# Patient Record
Sex: Female | Born: 1955 | Race: White | Hispanic: No | Marital: Married | Smoking: Former smoker
Health system: Southern US, Community
[De-identification: ages and names within clinical notes are randomized; demographics above are authoritative.]

## PROBLEM LIST (undated history)

## (undated) DIAGNOSIS — G5600 Carpal tunnel syndrome, unspecified upper limb: Secondary | ICD-10-CM

## (undated) DIAGNOSIS — F329 Major depressive disorder, single episode, unspecified: Secondary | ICD-10-CM

## (undated) DIAGNOSIS — I10 Essential (primary) hypertension: Secondary | ICD-10-CM

## (undated) DIAGNOSIS — E559 Vitamin D deficiency, unspecified: Secondary | ICD-10-CM

## (undated) DIAGNOSIS — F32A Depression, unspecified: Secondary | ICD-10-CM

## (undated) DIAGNOSIS — Z972 Presence of dental prosthetic device (complete) (partial): Secondary | ICD-10-CM

## (undated) DIAGNOSIS — F909 Attention-deficit hyperactivity disorder, unspecified type: Secondary | ICD-10-CM

## (undated) DIAGNOSIS — T8859XA Other complications of anesthesia, initial encounter: Secondary | ICD-10-CM

## (undated) DIAGNOSIS — G459 Transient cerebral ischemic attack, unspecified: Secondary | ICD-10-CM

## (undated) DIAGNOSIS — F319 Bipolar disorder, unspecified: Secondary | ICD-10-CM

## (undated) DIAGNOSIS — H3530 Unspecified macular degeneration: Secondary | ICD-10-CM

## (undated) DIAGNOSIS — F419 Anxiety disorder, unspecified: Secondary | ICD-10-CM

## (undated) DIAGNOSIS — M797 Fibromyalgia: Secondary | ICD-10-CM

## (undated) DIAGNOSIS — T4145XA Adverse effect of unspecified anesthetic, initial encounter: Secondary | ICD-10-CM

## (undated) HISTORY — PX: BREAST BIOPSY: SHX20

## (undated) HISTORY — DX: Vitamin D deficiency, unspecified: E55.9

## (undated) HISTORY — PX: THYROIDECTOMY: SHX17

## (undated) HISTORY — PX: ABDOMINAL HYSTERECTOMY: SHX81

## (undated) HISTORY — DX: Carpal tunnel syndrome, unspecified upper limb: G56.00

## (undated) HISTORY — PX: ABLATION: SHX5711

## (undated) HISTORY — PX: ANTERIOR CRUCIATE LIGAMENT REPAIR: SHX115

## (undated) HISTORY — DX: Major depressive disorder, single episode, unspecified: F32.9

## (undated) HISTORY — PX: EYE SURGERY: SHX253

## (undated) HISTORY — DX: Bipolar disorder, unspecified: F31.9

## (undated) HISTORY — DX: Unspecified macular degeneration: H35.30

## (undated) HISTORY — PX: TONSILLECTOMY AND ADENOIDECTOMY: SHX28

## (undated) HISTORY — DX: Attention-deficit hyperactivity disorder, unspecified type: F90.9

## (undated) HISTORY — DX: Depression, unspecified: F32.A

## (undated) HISTORY — DX: Anxiety disorder, unspecified: F41.9

---

## 2017-08-13 DIAGNOSIS — F325 Major depressive disorder, single episode, in full remission: Secondary | ICD-10-CM | POA: Diagnosis not present

## 2017-08-13 DIAGNOSIS — F419 Anxiety disorder, unspecified: Secondary | ICD-10-CM | POA: Diagnosis not present

## 2017-08-13 DIAGNOSIS — I10 Essential (primary) hypertension: Secondary | ICD-10-CM | POA: Insufficient documentation

## 2017-08-13 DIAGNOSIS — M62838 Other muscle spasm: Secondary | ICD-10-CM | POA: Diagnosis not present

## 2017-08-13 DIAGNOSIS — R030 Elevated blood-pressure reading, without diagnosis of hypertension: Secondary | ICD-10-CM | POA: Diagnosis not present

## 2017-08-13 DIAGNOSIS — F5104 Psychophysiologic insomnia: Secondary | ICD-10-CM | POA: Diagnosis not present

## 2017-08-13 DIAGNOSIS — Z1231 Encounter for screening mammogram for malignant neoplasm of breast: Secondary | ICD-10-CM | POA: Diagnosis not present

## 2017-08-13 DIAGNOSIS — E559 Vitamin D deficiency, unspecified: Secondary | ICD-10-CM | POA: Diagnosis not present

## 2017-08-13 DIAGNOSIS — M2550 Pain in unspecified joint: Secondary | ICD-10-CM | POA: Diagnosis not present

## 2017-08-13 DIAGNOSIS — F909 Attention-deficit hyperactivity disorder, unspecified type: Secondary | ICD-10-CM | POA: Diagnosis not present

## 2017-08-15 DIAGNOSIS — M2550 Pain in unspecified joint: Secondary | ICD-10-CM | POA: Insufficient documentation

## 2017-08-15 DIAGNOSIS — M62838 Other muscle spasm: Secondary | ICD-10-CM | POA: Insufficient documentation

## 2017-08-15 DIAGNOSIS — E559 Vitamin D deficiency, unspecified: Secondary | ICD-10-CM | POA: Insufficient documentation

## 2017-08-15 DIAGNOSIS — F909 Attention-deficit hyperactivity disorder, unspecified type: Secondary | ICD-10-CM | POA: Insufficient documentation

## 2017-08-15 DIAGNOSIS — F325 Major depressive disorder, single episode, in full remission: Secondary | ICD-10-CM | POA: Insufficient documentation

## 2017-08-15 DIAGNOSIS — F419 Anxiety disorder, unspecified: Secondary | ICD-10-CM | POA: Insufficient documentation

## 2017-08-16 ENCOUNTER — Other Ambulatory Visit: Payer: Self-pay | Admitting: Internal Medicine

## 2017-08-16 ENCOUNTER — Ambulatory Visit: Payer: Self-pay | Admitting: Family Medicine

## 2017-08-16 DIAGNOSIS — Z1239 Encounter for other screening for malignant neoplasm of breast: Secondary | ICD-10-CM

## 2017-08-31 DIAGNOSIS — M2042 Other hammer toe(s) (acquired), left foot: Secondary | ICD-10-CM | POA: Diagnosis not present

## 2017-08-31 DIAGNOSIS — M2012 Hallux valgus (acquired), left foot: Secondary | ICD-10-CM | POA: Diagnosis not present

## 2017-08-31 DIAGNOSIS — M21622 Bunionette of left foot: Secondary | ICD-10-CM | POA: Diagnosis not present

## 2017-09-01 ENCOUNTER — Ambulatory Visit: Payer: Self-pay | Admitting: Psychiatry

## 2017-09-16 DIAGNOSIS — F5104 Psychophysiologic insomnia: Secondary | ICD-10-CM | POA: Diagnosis not present

## 2017-09-16 DIAGNOSIS — Z13820 Encounter for screening for osteoporosis: Secondary | ICD-10-CM | POA: Diagnosis not present

## 2017-09-16 DIAGNOSIS — M7918 Myalgia, other site: Secondary | ICD-10-CM | POA: Insufficient documentation

## 2017-09-16 DIAGNOSIS — F325 Major depressive disorder, single episode, in full remission: Secondary | ICD-10-CM | POA: Diagnosis not present

## 2017-09-16 DIAGNOSIS — M2550 Pain in unspecified joint: Secondary | ICD-10-CM | POA: Diagnosis not present

## 2017-09-16 DIAGNOSIS — E559 Vitamin D deficiency, unspecified: Secondary | ICD-10-CM | POA: Diagnosis not present

## 2017-10-05 ENCOUNTER — Ambulatory Visit: Payer: Self-pay | Admitting: Psychiatry

## 2017-10-14 DIAGNOSIS — L821 Other seborrheic keratosis: Secondary | ICD-10-CM | POA: Diagnosis not present

## 2017-10-14 DIAGNOSIS — D2271 Melanocytic nevi of right lower limb, including hip: Secondary | ICD-10-CM | POA: Diagnosis not present

## 2017-10-14 DIAGNOSIS — D2262 Melanocytic nevi of left upper limb, including shoulder: Secondary | ICD-10-CM | POA: Diagnosis not present

## 2017-10-14 DIAGNOSIS — D2272 Melanocytic nevi of left lower limb, including hip: Secondary | ICD-10-CM | POA: Diagnosis not present

## 2017-10-14 DIAGNOSIS — D2261 Melanocytic nevi of right upper limb, including shoulder: Secondary | ICD-10-CM | POA: Diagnosis not present

## 2017-10-18 ENCOUNTER — Other Ambulatory Visit: Payer: Self-pay

## 2017-10-18 ENCOUNTER — Encounter: Payer: Self-pay | Admitting: Psychiatry

## 2017-10-18 ENCOUNTER — Ambulatory Visit: Payer: Self-pay | Admitting: Psychiatry

## 2017-10-18 VITALS — BP 152/87 | HR 85 | Temp 98.1°F | Ht 66.54 in | Wt 215.8 lb

## 2017-10-18 DIAGNOSIS — F3160 Bipolar disorder, current episode mixed, unspecified: Secondary | ICD-10-CM

## 2017-10-18 DIAGNOSIS — F410 Panic disorder [episodic paroxysmal anxiety] without agoraphobia: Secondary | ICD-10-CM | POA: Diagnosis not present

## 2017-10-18 DIAGNOSIS — Z8659 Personal history of other mental and behavioral disorders: Secondary | ICD-10-CM

## 2017-10-18 MED ORDER — HYDROXYZINE PAMOATE 25 MG PO CAPS: 25.0000 mg | ORAL_CAPSULE | Freq: Three times a day (TID) | ORAL | 1 refills | Status: DC | PRN

## 2017-10-18 MED ORDER — BUPROPION HCL ER (XL) 300 MG PO TB24: 300.0000 mg | ORAL_TABLET | Freq: Every day | ORAL | 2 refills | Status: DC

## 2017-10-18 MED ORDER — QUETIAPINE FUMARATE 25 MG PO TABS
25.0000 mg | ORAL_TABLET | Freq: Every day | ORAL | 1 refills | Status: DC
Start: 2017-10-18 — End: 2017-10-27

## 2017-10-18 MED ORDER — AMPHETAMINE-DEXTROAMPHETAMINE 30 MG PO TABS: 30.0000 mg | ORAL_TABLET | Freq: Every day | ORAL | 0 refills | Status: DC
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
## 2017-10-18 NOTE — Patient Instructions (Signed)
Hydroxyzine capsules or tablets What is this medicine? HYDROXYZINE (hye [place] i zeen) is an antihistamine. This medicine is used to treat allergy symptoms. It is also used to treat anxiety and tension. This medicine can be used with other medicines to induce sleep before surgery. This medicine may be used for other purposes; ask your health care provider or pharmacist if you have questions. COMMON BRAND NAME(S): ANX, Atarax, Rezine, Vistaril What should I tell my health care provider before I take this medicine? They need to know if you have any of these conditions: -any chronic illness -difficulty passing urine -glaucoma -heart disease -kidney disease -liver disease -lung disease -an unusual or allergic reaction to hydroxyzine, cetirizine, other medicines, foods, dyes, or preservatives -pregnant or trying to get pregnant -breast-feeding How should I use this medicine? Take this medicine by mouth with a full glass of water. Follow the directions on the prescription label. You may take this medicine with food or on an empty stomach. Take your medicine at regular intervals. Do not take your medicine more often than directed. Talk to your pediatrician regarding the use of this medicine in children. Special care may be needed. While this drug may be prescribed for children as young as 75 years of age for selected conditions, precautions do apply. Patients over 62 years old may have a stronger reaction and need a smaller dose. Overdosage: If you think you have taken too much of this medicine contact a poison control center or emergency room at once. NOTE: This medicine is only for you. Do not share this medicine with others. What if I miss a dose? If you miss a dose, take it as soon as you can. If it is almost time for your next dose, take only that dose. Do not take double or extra doses. What may interact with this medicine? -alcohol -barbiturate medicines for sleep or seizures -medicines for  colds, allergies -medicines for depression, anxiety, or emotional disturbances -medicines for pain -medicines for sleep -muscle relaxants This list may not describe all possible interactions. Give your health care provider a list of all the medicines, herbs, non-prescription drugs, or dietary supplements you use. Also tell them if you smoke, drink alcohol, or use illegal drugs. Some items may interact with your medicine. What should I watch for while using this medicine? Tell your doctor or health care professional if your symptoms do not improve. You may get drowsy or dizzy. Do not drive, use machinery, or do anything that needs mental alertness until you know how this medicine affects you. Do not stand or sit up quickly, especially if you are an older patient. This reduces the risk of dizzy or fainting spells. Alcohol may interfere with the effect of this medicine. Avoid alcoholic drinks. Your mouth may get dry. Chewing sugarless gum or sucking hard candy, and drinking plenty of water may help. Contact your doctor if the problem does not go away or is severe. This medicine may cause dry eyes and blurred vision. If you wear contact lenses you may feel some discomfort. Lubricating drops may help. See your eye doctor if the problem does not go away or is severe. If you are receiving skin tests for allergies, tell your doctor you are using this medicine. What side effects may I notice from receiving this medicine? Side effects that you should report to your doctor or health care professional as soon as possible: -fast or irregular heartbeat -difficulty passing urine -seizures -slurred speech or confusion -tremor Side effects that  usually do not require medical attention (report to your doctor or health care professional if they continue or are bothersome): -constipation -drowsiness -fatigue -headache -stomach upset This list may not describe all possible side effects. Call your doctor for  medical advice about side effects. You may report side effects to FDA at 1-800-FDA-1088. Where should I keep my medicine? Keep out of the reach of children. Store at room temperature between 15 and 30 degrees C (59 and 86 degrees F). Keep container tightly closed. Throw away any unused medicine after the expiration date. NOTE: This sheet is a summary. It may not cover all possible information. If you have questions about this medicine, talk to your doctor, pharmacist, or health care provider.  2018 Elsevier/Gold Standard (2007-11-11 14:50:59) Quetiapine tablets What is this medicine? QUETIAPINE (kwe TYE a peen) is an antipsychotic. It is used to treat schizophrenia and bipolar disorder, also known as manic-depression. This medicine may be used for other purposes; ask your health care provider or pharmacist if you have questions. COMMON BRAND NAME(S): Seroquel What should I tell my health care provider before I take this medicine? They need to know if you have any of these conditions: -brain tumor or head injury -breast cancer -cataracts -diabetes -difficulty swallowing -heart disease -kidney disease -liver disease -low blood counts, like low white cell, platelet, or red cell counts -low blood pressure or dizziness when standing up -Parkinson's disease -previous heart attack -seizures -suicidal thoughts, plans, or attempt by you or a family member -thyroid disease -an unusual or allergic reaction to quetiapine, other medicines, foods, dyes, or preservatives -pregnant or trying to get pregnant -breast-feeding How should I use this medicine? Take this medicine by mouth. Swallow it with a drink of water. Follow the directions on the prescription label. If it upsets your stomach you can take it with food. Take your medicine at regular intervals. Do not take it more often than directed. Do not stop taking except on the advice of your doctor or health care professional. A special MedGuide  will be given to you by the pharmacist with each prescription and refill. Be sure to read this information carefully each time. Talk to your pediatrician regarding the use of this medicine in children. While this drug may be prescribed for children as young as 10 years for selected conditions, precautions do apply. Patients over age 65 years may have a stronger reaction to this medicine and need smaller doses. Overdosage: If you think you have taken too much of this medicine contact a poison control center or emergency room at once. NOTE: This medicine is only for you. Do not share this medicine with others. What if I miss a dose? If you miss a dose, take it as soon as you can. If it is almost time for your next dose, take only that dose. Do not take double or extra doses. What may interact with this medicine? Do not take this medicine with any of the following medications: -certain medicines for fungal infections like fluconazole, itraconazole, ketoconazole, posaconazole, voriconazole -cisapride -dofetilide -dronedarone -droperidol -grepafloxacin -halofantrine -phenothiazines like chlorpromazine, mesoridazine, thioridazine -pimozide -sparfloxacin -ziprasidone This medicine may also interact with the following medications: -alcohol -antiviral medicines for HIV or AIDS -certain medicines for blood pressure -certain medicines for depression, anxiety, or psychotic disturbances like haloperidol, lorazepam -certain medicines for diabetes -certain medicines for Parkinson's disease -certain medicines for seizures like carbamazepine, phenobarbital, phenytoin -cimetidine -erythromycin -other medicines that prolong the QT interval (cause an abnormal heart rhythm) -rifampin -steroid   medicines like prednisone or cortisone This list may not describe all possible interactions. Give your health care provider a list of all the medicines, herbs, non-prescription drugs, or dietary supplements you use.  Also tell them if you smoke, drink alcohol, or use illegal drugs. Some items may interact with your medicine. What should I watch for while using this medicine? Visit your doctor or health care professional for regular checks on your progress. It may be several weeks before you see the full effects of this medicine. Your health care provider may suggest that you have your eyes examined prior to starting this medicine, and every 6 months thereafter. If you have been taking this medicine regularly for some time, do not suddenly stop taking it. You must gradually reduce the dose or your symptoms may get worse. Ask your doctor or health care professional for advice. Patients and their families should watch out for worsening depression or thoughts of suicide. Also watch out for sudden or severe changes in feelings such as feeling anxious, agitated, panicky, irritable, hostile, aggressive, impulsive, severely restless, overly excited and hyperactive, or not being able to sleep. If this happens, especially at the beginning of antidepressant treatment or after a change in dose, call your health care professional. You may get dizzy or drowsy. Do not drive, use machinery, or do anything that needs mental alertness until you know how this medicine affects you. Do not stand or sit up quickly, especially if you are an older patient. This reduces the risk of dizzy or fainting spells. Alcohol can increase dizziness and drowsiness. Avoid alcoholic drinks. Do not treat yourself for colds, diarrhea or allergies. Ask your doctor or health care professional for advice, some ingredients may increase possible side effects. This medicine can reduce the response of your body to heat or cold. Dress warm in cold weather and stay hydrated in hot weather. If possible, avoid extreme temperatures like saunas, hot tubs, very hot or cold showers, or activities that can cause dehydration such as vigorous exercise. What side effects may I  notice from receiving this medicine? Side effects that you should report to your doctor or health care professional as soon as possible: -allergic reactions like skin rash, itching or hives, swelling of the face, lips, or tongue -difficulty swallowing -fast or irregular heartbeat -fever or chills, sore throat -fever with rash, swollen lymph nodes, or swelling of the face -increased hunger or thirst -increased urination -problems with balance, talking, walking -seizures -stiff muscles -suicidal thoughts or other mood changes -uncontrollable head, mouth, neck, arm, or leg movements -unusually weak or tired Side effects that usually do not require medical attention (report to your doctor or health care professional if they continue or are bothersome): -change in sex drive or performance -constipation -drowsy or dizzy -dry mouth -stomach upset -weight gain This list may not describe all possible side effects. Call your doctor for medical advice about side effects. You may report side effects to FDA at 1-800-FDA-1088. Where should I keep my medicine? Keep out of the reach of children. Store at room temperature between 15 and 30 degrees C (59 and 86 degrees F). Throw away any unused medicine after the expiration date. NOTE: This sheet is a summary. It may not cover all possible information. If you have questions about this medicine, talk to your doctor, pharmacist, or health care provider.  2018 Elsevier/Gold Standard (2015-01-01 13:07:35)  

## 2017-10-19 ENCOUNTER — Encounter: Payer: Self-pay | Admitting: Psychiatry

## 2017-10-27 ENCOUNTER — Encounter: Payer: Self-pay | Admitting: Psychiatry

## 2017-10-27 ENCOUNTER — Other Ambulatory Visit: Payer: Self-pay

## 2017-10-27 ENCOUNTER — Ambulatory Visit: Payer: Medicare HMO | Admitting: Psychiatry

## 2017-10-27 VITALS — BP 135/82 | HR 94 | Wt 213.6 lb

## 2017-10-27 DIAGNOSIS — F3160 Bipolar disorder, current episode mixed, unspecified: Secondary | ICD-10-CM

## 2017-10-27 DIAGNOSIS — F410 Panic disorder [episodic paroxysmal anxiety] without agoraphobia: Secondary | ICD-10-CM

## 2017-10-27 DIAGNOSIS — Z8659 Personal history of other mental and behavioral disorders: Secondary | ICD-10-CM | POA: Diagnosis not present

## 2017-10-27 MED ORDER — OLANZAPINE 5 MG PO TABS: 5.0000 mg | ORAL_TABLET | Freq: Every day | ORAL | 1 refills | Status: DC

## 2017-10-27 MED ORDER — TRAZODONE HCL 100 MG PO TABS: 100.0000 mg | ORAL_TABLET | Freq: Every day | ORAL | 1 refills | Status: DC
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
## 2017-10-27 NOTE — Patient Instructions (Signed)
Olanzapine tablets What is this medicine? OLANZAPINE (oh LAN za peen) is used to treat schizophrenia, psychotic disorders, and bipolar disorder. Bipolar disorder is also known as manic-depression. This medicine may be used for other purposes; ask your health care provider or pharmacist if you have questions. COMMON BRAND NAME(S): Zyprexa What should I tell my health care provider before I take this medicine? They need to know if you have any of these conditions: -breast cancer or history of breast cancer -cigarette smoker -dementia -diabetes mellitus, high blood sugar or a family history of diabetes -difficulty swallowing -glaucoma -heart disease, irregular heartbeat, or previous heart attack -history of brain tumor or head injury -kidney or liver disease -low blood pressure or dizziness when standing up -Parkinson's disease -prostate trouble -seizures (convulsions) -suicidal thoughts, plans, or attempt by you or a family member -an unusual or allergic reaction to olanzapine, other medicines, foods, dyes, or preservatives -pregnant or trying to get pregnant -breast-feeding How should I use this medicine? Take this medicine by mouth. Swallow it with a drink of water. Follow the directions on the prescription label. Take your medicine at regular intervals. Do not take it more often than directed. Do not stop taking except on the advice of your doctor or health care professional. A special MedGuide will be given to you by the pharmacist with each new prescription and refill. Be sure to read this information carefully each time. Talk to your pediatrician regarding the use of this medicine in children. While this drug may be prescribed for children as young as 13 years for selected conditions, precautions do apply. Overdosage: If you think you have taken too much of this medicine contact a poison control center or emergency room at once. NOTE: This medicine is only for you. Do not share this  medicine with others. What if I miss a dose? If you miss a dose, take it as soon as you can. If it is almost time for your next dose, take only that dose. Do not take double or extra doses. What may interact with this medicine? Do not take this medicine with any of the following medications: -certain antibiotics like grepafloxacin and sparfloxacin -certain phenothiazines like chlorpromazine, mesoridazine, and thioridazine -cisapride -clozapine -droperidol -halofantrine -levomethadyl -pimozide This medicine may also interact with the following medications: -carbamazepine -charcoal -fluvoxamine -levodopa and other medicines for Parkinson's disease -medicines for diabetes -medicines for high blood pressure -medicines for mental depression, anxiety, other mood disorders, or sleeping problems -omeprazole -rifampin -ritonavir -tobacco from cigarettes This list may not describe all possible interactions. Give your health care provider a list of all the medicines, herbs, non-prescription drugs, or dietary supplements you use. Also tell them if you smoke, drink alcohol, or use illegal drugs. Some items may interact with your medicine. What should I watch for while using this medicine? Visit your doctor or health care professional for regular checks on your progress. It may be several weeks before you see the full effects of this medicine. Notify your doctor or health care professional if your symptoms get worse, if you have new symptoms, if you are having an unusual effect from this medicine, or if you feel out of control, very discouraged or think you might harm yourself or others. Do not suddenly stop taking this medicine. You may need to gradually reduce the dose. Ask your doctor or health care professional for advice. You may get dizzy or drowsy. Do not drive, use machinery, or do anything that needs mental alertness until you know   how this medicine affects you. Do not stand or sit up  quickly, especially if you are an older patient. This reduces the risk of dizzy or fainting spells. Avoid alcoholic drinks. Alcohol can increase dizziness and drowsiness with olanzapine. Do not treat yourself for colds, diarrhea or allergies without asking your doctor or health care professional for advice. Some ingredients can increase possible side effects. Your mouth may get dry. Chewing sugarless gum or sucking hard candy, and drinking plenty of water will help. This medicine can reduce the response of your body to heat or cold. Dress warm in cold weather and stay hydrated in hot weather. If possible, avoid extreme temperatures like saunas, hot tubs, very hot or cold showers, or activities that can cause dehydration such as vigorous exercise. If you notice an increased hunger or thirst, different from your normal hunger or thirst, or if you find that you have to urinate more frequently, you should contact your health care provider as soon as possible. You may need to have your blood sugar monitored. This medicine may cause changes in your blood sugar levels. You should monitor you blood sugar frequently if you have diabetes. If you smoke, tell your doctor if you notice this medicine is not working well for you. Talk to your doctor if you are a smoker or if you decide to stop smoking. What side effects may I notice from receiving this medicine? Side effects that you should report to your doctor or health care professional as soon as possible: -allergic reactions like skin rash, itching or hives, swelling of the face, lips, or tongue -breathing problems -difficulty in speaking or swallowing -excessive thirst and/or hunger -fast heartbeat (palpitations) -fever or chills, sore throat -fever with rash, swollen lymph nodes, or swelling of the face -frequently needing to urinate -inability to control muscle movements in the face, hands, arms, or legs -painful or prolonged erections -redness,  blistering, peeling or loosening of the skin, including inside the mouth -restlessness or need to keep moving -seizures (convulsions) -stiffness, spasms -tremors or trembling Side effects that usually do not require medical attention (report to your doctor or health care professional if they continue or are bothersome): -changes in sexual desire -constipation -drowsiness -lowered blood pressure This list may not describe all possible side effects. Call your doctor for medical advice about side effects. You may report side effects to FDA at 1-800-FDA-1088. Where should I keep my medicine? Keep out of the reach of children. Store at controlled room temperature between 15 and 30 degrees C (59 and 86 degrees F). Protect from light and moisture. Throw away any unused medicine after the expiration date. NOTE: This sheet is a summary. It may not cover all possible information. If you have questions about this medicine, talk to your doctor, pharmacist, or health care provider.  2018 Elsevier/Gold Standard (2014-11-20 17:33:14)  

## 2017-11-01 ENCOUNTER — Ambulatory Visit: Payer: Self-pay | Admitting: Psychiatry

## 2017-11-01 DIAGNOSIS — H353113 Nonexudative age-related macular degeneration, right eye, advanced atrophic without subfoveal involvement: Secondary | ICD-10-CM | POA: Diagnosis not present

## 2017-11-01 DIAGNOSIS — H35372 Puckering of macula, left eye: Secondary | ICD-10-CM | POA: Diagnosis not present

## 2017-11-01 DIAGNOSIS — H33192 Other retinoschisis and retinal cysts, left eye: Secondary | ICD-10-CM | POA: Diagnosis not present

## 2017-11-01 DIAGNOSIS — H353124 Nonexudative age-related macular degeneration, left eye, advanced atrophic with subfoveal involvement: Secondary | ICD-10-CM | POA: Diagnosis not present

## 2017-11-10 ENCOUNTER — Other Ambulatory Visit: Payer: Self-pay | Admitting: Psychiatry

## 2017-11-10 DIAGNOSIS — Z0000 Encounter for general adult medical examination without abnormal findings: Secondary | ICD-10-CM | POA: Diagnosis not present

## 2017-11-10 DIAGNOSIS — Z1231 Encounter for screening mammogram for malignant neoplasm of breast: Secondary | ICD-10-CM | POA: Diagnosis not present

## 2017-11-10 DIAGNOSIS — E559 Vitamin D deficiency, unspecified: Secondary | ICD-10-CM | POA: Diagnosis not present

## 2017-11-10 DIAGNOSIS — I10 Essential (primary) hypertension: Secondary | ICD-10-CM | POA: Diagnosis not present

## 2017-11-10 DIAGNOSIS — M2550 Pain in unspecified joint: Secondary | ICD-10-CM | POA: Diagnosis not present

## 2017-11-10 DIAGNOSIS — F3160 Bipolar disorder, current episode mixed, unspecified: Secondary | ICD-10-CM

## 2017-11-10 DIAGNOSIS — Z780 Asymptomatic menopausal state: Secondary | ICD-10-CM | POA: Diagnosis not present

## 2017-11-10 DIAGNOSIS — F325 Major depressive disorder, single episode, in full remission: Secondary | ICD-10-CM | POA: Diagnosis not present

## 2017-11-10 DIAGNOSIS — M7918 Myalgia, other site: Secondary | ICD-10-CM | POA: Diagnosis not present

## 2017-11-10 DIAGNOSIS — F410 Panic disorder [episodic paroxysmal anxiety] without agoraphobia: Secondary | ICD-10-CM

## 2017-11-10 DIAGNOSIS — Z8659 Personal history of other mental and behavioral disorders: Secondary | ICD-10-CM

## 2017-11-10 DIAGNOSIS — G8929 Other chronic pain: Secondary | ICD-10-CM | POA: Diagnosis not present

## 2017-11-17 ENCOUNTER — Other Ambulatory Visit: Payer: Self-pay

## 2017-11-17 ENCOUNTER — Encounter: Payer: Self-pay | Admitting: Psychiatry

## 2017-11-17 ENCOUNTER — Ambulatory Visit: Payer: Medicare HMO | Admitting: Psychiatry

## 2017-11-17 VITALS — BP 99/61 | HR 84 | Temp 97.9°F | Wt 218.0 lb

## 2017-11-17 DIAGNOSIS — Z8659 Personal history of other mental and behavioral disorders: Secondary | ICD-10-CM | POA: Diagnosis not present

## 2017-11-17 DIAGNOSIS — F410 Panic disorder [episodic paroxysmal anxiety] without agoraphobia: Secondary | ICD-10-CM

## 2017-11-17 DIAGNOSIS — F3160 Bipolar disorder, current episode mixed, unspecified: Secondary | ICD-10-CM | POA: Diagnosis not present

## 2017-11-17 MED ORDER — OLANZAPINE 5 MG PO TABS: 7.5000 mg | ORAL_TABLET | Freq: Every day | ORAL | 1 refills | Status: DC

## 2017-11-17 MED ORDER — AMPHETAMINE-DEXTROAMPHETAMINE 30 MG PO TABS: 30.0000 mg | ORAL_TABLET | Freq: Every day | ORAL | 0 refills | Status: DC

## 2017-11-17 NOTE — Progress Notes (Signed)
[facility] OP Progress Note  11/17/2017 3:26 PM Gina Wallace  MRN:  [id number]  Chief Complaint:  Chief Complaint    Follow-up; Medication Refill     HPI: Gina Wallace is a 62 year old Caucasian female, has a history of bipolar disorder, panic attacks, history of ADHD, chronic pain, presented to the clinic today for a follow-up visit.  Patient recently moved to [place] from [place].  Patient today reports she likes the effect of Zyprexa.  She reports the Zyprexa and the trazodone has been helping her to rest better.  She reports she has also noticed some improvement in her anxiety and mood symptoms.  She however reports she may still have some nights when she is not sleeping.  She agrees to increase her Zyprexa at this visit.  She continues to take Adderall for her ADHD symptoms.  Patient was advised last visit to get records from her previous provider who was prescribing her Adderall.  Patient was unable to do so.  Discussed with patient that Adderall cannot be prescribed unless she can get her records or go for retesting.  Discussed with patient that I can give her 15 more days of medication so that she can get the records or get retesting.  Patient denies any other concerns today.  Patient denies any suicidality.  Patient denies any perceptual disturbances.  Patient reports psychosocial stressors of relocating to [place], getting her house, and planning a wedding.  She reports the wedding is going to [place] in June and she is anxious and excited about it.  She continues to have good support system from her fianc. Visit Diagnosis:    ICD-10-CM   1. Bipolar I disorder, most recent episode mixed ([place]) F31.60   2. Panic attacks F41.0   3. History of ADHD Z86.59 amphetamine-dextroamphetamine (ADDERALL) 30 MG tablet    Past Psychiatric History: Reviewed past psychiatric history from my progress note on 10/18/2017.  Past trials of lithium, Klonopin, Adderall, trazodone.  Past Medical  History:  Past Medical History:  Diagnosis Date  . ADHD (attention deficit hyperactivity disorder)   . Anxiety   . Bipolar disorder ([place])   . Depression     Past Surgical History:  Procedure Laterality Date  . ABDOMINAL HYSTERECTOMY    . ABLATION    . CESAREAN SECTION    . THYROIDECTOMY    . TONSILLECTOMY AND ADENOIDECTOMY      Family Psychiatric History: Reviewed Family psychiatric history from my progress note on 10/18/2017.  Family History:  Family History  Problem Relation Age of Onset  . Anxiety disorder Mother   . Depression Mother   . Alcohol abuse Maternal Uncle   Substance abuse history: Denies   Social History: Pt is engaged, lives with her fianc.  She recently moved to [place] from [place].  She currently lives in [place].  She is on SSD for multiple medical issues, chronic pain.  She has 2 daughters who are adults who live in [place].  She used to work in the radiology field in the past but had to go on disability due to fibromyalgia. Social History   Socioeconomic History  . Marital status: Married    Spouse name: sam  . Number of children: 2  . Years of education: Not on file  . Highest education level: Associate degree: occupational, Hotel manager, or vocational program  Occupational History  . Not on file  Social Needs  . Financial resource strain: Not hard at all  . Food insecurity:  Worry: Never true    Inability: Never true  . Transportation needs:    Medical: No    Non-medical: No  Tobacco Use  . Smoking status: Former Smoker    Types: Cigarettes    Last attempt to quit: 10/18/2005    Years since quitting: 12.0  . Smokeless tobacco: Never Used  Substance and Sexual Activity  . Alcohol use: Not Currently  . Drug use: Not Currently  . Sexual activity: Yes    Birth control/protection: None  Lifestyle  . Physical activity:    Days per week: 0 days    Minutes per session: 0 min  . Stress: Very much  Relationships  . Social  connections:    Talks on phone: Not on file    Gets together: Not on file    Attends religious service: Never    Active member of club or organization: No    Attends meetings of clubs or organizations: Never    Relationship status: Married  Other Topics Concern  . Not on file  Social History Narrative  . Not on file    Allergies: No Known Allergies  Metabolic Disorder Labs: No results found for: HGBA1C, MPG No results found for: PROLACTIN No results found for: CHOL, TRIG, HDL, CHOLHDL, VLDL, LDLCALC No results found for: TSH  Therapeutic Level Labs: No results found for: LITHIUM No results found for: VALPROATE No components found for:  CBMZ  Current Medications: Current Outpatient Medications  Medication Sig Dispense Refill  . amphetamine-dextroamphetamine (ADDERALL) 30 MG tablet Take 1 tablet by mouth daily. 15 tablet 0  . buPROPion (WELLBUTRIN XL) 300 MG 24 hr tablet Take 1 tablet (300 mg total) by mouth daily. 30 tablet 2  . hydrOXYzine (VISTARIL) 25 MG capsule Take 1 capsule (25 mg total) by mouth 3 (three) times daily as needed (SEVERE ANXIETY SX). 90 capsule 1  . lisinopril (PRINIVIL,ZESTRIL) 5 MG tablet Take by mouth.    . OLANZapine (ZYPREXA) 5 MG tablet Take 1.5 tablets (7.5 mg total) by mouth at bedtime. 45 tablet 1  . tiZANidine (ZANAFLEX) 4 MG capsule Take by mouth.    . traZODone (DESYREL) 100 MG tablet Take 1-1.5 tablets (100-150 mg total) by mouth at bedtime. 45 tablet 1   No current facility-administered medications for this visit.      Musculoskeletal: Strength & Muscle Tone: within normal limits Gait & Station: normal Patient leans: N/A  Psychiatric Specialty Exam: Review of Systems  Psychiatric/Behavioral: The patient is nervous/anxious.   All other systems reviewed and are negative.   Blood pressure 99/61, pulse 84, temperature 97.9 F (36.6 C), temperature source Oral, weight 218 lb (98.9 kg).Body mass index is 34.62 kg/m.  General  Appearance: Casual  Eye Contact:  Fair  Speech:  Normal Rate  Volume:  Normal  Mood:  Anxious  Affect:  Congruent  Thought Process:  Goal Directed and Descriptions of Associations: Intact  Orientation:  Full (Time, Place, and Person)  Thought Content: Logical   Suicidal Thoughts:  No  Homicidal Thoughts:  No  Memory:  Immediate;   Fair Recent;   Fair Remote;   Fair  Judgement:  Fair  Insight:  Fair  Psychomotor Activity:  Normal  Concentration:  Concentration: Fair and Attention Span: Fair  Recall:  [employer] of Knowledge: Fair  Language: Fair  Akathisia:  No  Handed:  Right  AIMS (if indicated): 0  Assets:  Communication Skills Desire for Improvement Social Support  ADL's:  Intact  Cognition:  WNL  Sleep:  Fair   Screenings:AIMS   Assessment and Plan: Crescentia is a 63 year old Caucasian female who has a history of bipolar disorder, anxiety disorder, chronic pain, fibromyalgia, presented to the clinic today for a follow-up visit.  Patient continues to struggle with some anxiety symptoms and sleep issues although progressing.  Patient also struggled with ADHD symptoms.  Discussed medication changes with patient.  Plan as noted below.  Plan Bipolar disorder Increase Zyprexa to 7.5 mg p.o. nightly Continue Wellbutrin XL 300 mg p.o. daily.  For anxiety disorder Continue Hydroxyzine as prescribed.  For insomnia Trazodone 100-150 mg p.o. nightly as needed Increase Zyprexa to 7.5 mg p.o. nightly  History of ADHD We will give her Adderall 30 mg for 15 days. I have reviewed [place] controlled substance database. Discussed with patient to get her records from her previous provider which she has been unable to do so until now.  Discussed with her that she needs to be sent out for retesting if she cannot get the records.  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  Patient reports she was told at that time that it  could be due to multiple medications.  She denies any worsening.  We will continue to monitor closely.  Follow-up in clinic in 15 days or sooner if needed.  Patient has been referred for CBT with Ms. Peacock.  More than 50 % of the time was spent for psychoeducation and supportive psychotherapy and care coordination.  This note was generated in part or whole with voice recognition software. Voice recognition is usually quite accurate but there are transcription errors that can and very often do occur. I apologize for any typographical errors that were not detected and corrected.        Ursula Alert, MD 11/18/2017, 9:49 AM

## 2017-11-18 ENCOUNTER — Encounter: Payer: Self-pay | Admitting: Psychiatry

## 2017-11-18 ENCOUNTER — Other Ambulatory Visit: Payer: Self-pay | Admitting: Psychiatry

## 2017-11-18 DIAGNOSIS — M8588 Other specified disorders of bone density and structure, other site: Secondary | ICD-10-CM | POA: Diagnosis not present

## 2017-11-18 DIAGNOSIS — Z780 Asymptomatic menopausal state: Secondary | ICD-10-CM | POA: Diagnosis not present

## 2017-11-30 ENCOUNTER — Ambulatory Visit: Payer: Medicare HMO | Admitting: Psychiatry

## 2017-11-30 ENCOUNTER — Encounter: Payer: Self-pay | Admitting: Psychiatry

## 2017-11-30 ENCOUNTER — Other Ambulatory Visit: Payer: Self-pay

## 2017-11-30 VITALS — BP 134/77 | HR 83 | Wt 213.1 lb

## 2017-11-30 DIAGNOSIS — F410 Panic disorder [episodic paroxysmal anxiety] without agoraphobia: Secondary | ICD-10-CM | POA: Diagnosis not present

## 2017-11-30 DIAGNOSIS — Z8659 Personal history of other mental and behavioral disorders: Secondary | ICD-10-CM | POA: Diagnosis not present

## 2017-11-30 DIAGNOSIS — F3160 Bipolar disorder, current episode mixed, unspecified: Secondary | ICD-10-CM

## 2017-11-30 NOTE — Progress Notes (Signed)
[facility] MD OP Progress Note  11/30/2017 2:53 PM [name]  MRN:  [id number]  Chief Complaint: ' I am here for follow up." Chief Complaint    Follow-up; Medication Refill     HPI: Gina Wallace is a 62 year old Caucasian female who has a history of bipolar disorder, panic attacks, history of ADHD, chronic pain, presented to the clinic today for a follow-up visit.  Patient recently moved to [place] from [place].  Patient today reports that she likes the effect of Zyprexa and continues to do well on the same.  She denies any significant mood lability.  She reports a lot as going on in her life at this point.  She just relocated and she also is planning a wedding.  Patient reports sleep is improved.  She continues to have some dreams.  She however reports she is not bothered by it and does not want any medications for the same.  Patient does have a history of ADHD symptoms.  Patient was advised during her initial visit as well as subsequent visits that Adderall can only be prescribed if we can get her previous records from her previous provider.  Patient unable to get records from her previous provider. Pt was advised during initial visit as well as subsequent visit that she may get retesting for her ADHD if she is unable to get records from her previous provider in [place].  This was discussed with patient again.  Patient agrees with plan.  Discussed with patient to reach out to Dr. Lynann Beaver to get ADHD testing done.  Patient will be given information to contact Dr. Grandville Silos. Visit Diagnosis:    ICD-10-CM   1. Bipolar I disorder, most recent episode mixed ([place]) F31.60   2. Panic attacks F41.0   3. History of ADHD Z86.59     Past Psychiatric History: Past psychiatric history from my progress note on 10/18/2017.  Past trials of lithium, Klonopin, Adderall, trazodone.  Past Medical History:  Past Medical History:  Diagnosis Date  . ADHD (attention deficit hyperactivity disorder)   .  Anxiety   . Bipolar disorder ([place])   . Depression     Past Surgical History:  Procedure Laterality Date  . ABDOMINAL HYSTERECTOMY    . ABLATION    . CESAREAN SECTION    . THYROIDECTOMY    . TONSILLECTOMY AND ADENOIDECTOMY      Family Psychiatric History: Reviewed family psychiatric history from my progress note on 10/18/2017.  Family History:  Family History  Problem Relation Age of Onset  . Anxiety disorder Mother   . Depression Mother   . Alcohol abuse Maternal Uncle     Social History: Reviewed social history from my progress note on 10/18/2017. Social History   Socioeconomic History  . Marital status: Married    Spouse name: sam  . Number of children: 2  . Years of education: Not on file  . Highest education level: Associate degree: occupational, Hotel manager, or vocational program  Occupational History  . Not on file  Social Needs  . Financial resource strain: Not hard at all  . Food insecurity:    Worry: Never true    Inability: Never true  . Transportation needs:    Medical: No    Non-medical: No  Tobacco Use  . Smoking status: Former Smoker    Types: Cigarettes    Last attempt to quit: 10/18/2005    Years since quitting: 12.1  . Smokeless tobacco: Never Used  Substance and Sexual Activity  .  Alcohol use: Not Currently  . Drug use: Not Currently  . Sexual activity: Yes    Birth control/protection: None  Lifestyle  . Physical activity:    Days per week: 0 days    Minutes per session: 0 min  . Stress: Very much  Relationships  . Social connections:    Talks on phone: Not on file    Gets together: Not on file    Attends religious service: Never    Active member of club or organization: No    Attends meetings of clubs or organizations: Never    Relationship status: Married  Other Topics Concern  . Not on file  Social History Narrative  . Not on file    Allergies: No Known Allergies  Metabolic Disorder Labs: No results found for: HGBA1C, MPG No  results found for: PROLACTIN No results found for: CHOL, TRIG, HDL, CHOLHDL, VLDL, LDLCALC No results found for: TSH  Therapeutic Level Labs: No results found for: LITHIUM No results found for: VALPROATE No components found for:  CBMZ  Current Medications: Current Outpatient Medications  Medication Sig Dispense Refill  . amphetamine-dextroamphetamine (ADDERALL) 30 MG tablet Take 1 tablet by mouth daily. 15 tablet 0  . buPROPion (WELLBUTRIN XL) 300 MG 24 hr tablet Take 1 tablet (300 mg total) by mouth daily. 30 tablet 2  . hydrOXYzine (VISTARIL) 25 MG capsule Take 1 capsule (25 mg total) by mouth 3 (three) times daily as needed (SEVERE ANXIETY SX). 90 capsule 1  . lisinopril (PRINIVIL,ZESTRIL) 5 MG tablet Take by mouth.    . OLANZapine (ZYPREXA) 5 MG tablet Take 1.5 tablets (7.5 mg total) by mouth at bedtime. 45 tablet 1  . tiZANidine (ZANAFLEX) 4 MG capsule Take by mouth.    . traZODone (DESYREL) 100 MG tablet TAKE 1-1.5 TABLETS (100-150 MG TOTAL) BY MOUTH AT BEDTIME. 135 tablet 1   No current facility-administered medications for this visit.      Musculoskeletal: Strength & Muscle Tone: within normal limits Gait & Station: normal Patient leans: N/A  Psychiatric Specialty Exam: Review of Systems  Psychiatric/Behavioral: The patient is nervous/anxious.   All other systems reviewed and are negative.   Blood pressure 134/77, pulse 83, weight 213 lb 1.6 oz (96.7 kg).Body mass index is 33.84 kg/m.  General Appearance: Casual  Eye Contact:  Fair  Speech:  Clear and Coherent  Volume:  Normal  Mood:  Anxious  Affect:  Congruent  Thought Process:  Goal Directed and Descriptions of Associations: Intact  Orientation:  Full (Time, Place, and Person)  Thought Content: Logical   Suicidal Thoughts:  No  Homicidal Thoughts:  No  Memory:  Immediate;   Fair Recent;   Fair Remote;   Fair  Judgement:  Fair  Insight:  Fair  Psychomotor Activity:  Normal  Concentration:  Concentration:  Fair and Attention Span: Fair  Recall:  [employer] of Knowledge: Fair  Language: Fair  Akathisia:  No  Handed:  Right  AIMS (if indicated): denies rigidity, stiffness  Assets:  Communication Skills Desire for Improvement Social Support  ADL's:  Intact  Cognition: WNL  Sleep:  Fair   Screenings:   Assessment and Plan: Adaleah is a 62 year old Caucasian female who has a history of bipolar disorder, anxiety disorder, chronic pain, fibromyalgia, presented to the clinic today for a follow-up visit.  Patient reports Zyprexa as helpful with her mood.  She also reports sleeping better but reports she has some dreams.  She however reports she is not  worried about this dreams.  She does not want any additional medications for that today.  Patient does have a history of ADHD per report and was advised by writer during her initial visit as well as subsequent visits that Adderall can be prescribed only if she can get her previous medical records from her previous provider that documents that she does have ADHD or she may need to be sent for retesting.  Patient during this visit initially stated and seemed to be upset that we did not receive the records from her previous provider.  Patient was adamant initially that her previous provider has sent in all the records last week while she stayed on the phone with them.  Writer as well as staff here were able to confirm that we did not receive any records from her previous provider and this was discussed with patient today.  Patient soon after that called her previous provider while in writer's office and spoke to them.  Patient after speaking to them told writer that all the providers that she worked with in [place] were out of practice at this time and hence they would not be able to send any of her medical records to [place].  Discussed with patient if that is the case she may need to be sent for retesting.  Patient although upset agreed with plan.  Plan Bipolar  disorder Continue Zyprexa 7.5 mg p.o. nightly Continue Wellbutrin XL 300 mg p.o. daily  For anxiety disorder Continue hydroxyzine as prescribed  For insomnia Trazodone 100-150 mg p.o. nightly as needed Continue Zyprexa 7.5 mg p.o. nightly  History of ADHD Discussed with patient that her Adderall prescription can be continued only if she can get retesting or get records from her previous provider.  Patient was given Dr. Biagio Borg information for retesting for ADHD.  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  Patient reports she was told at that time that it could be due to being on polypharmacy.  She denies any worsening of her cognitive problems at this time.    Follow-up in clinic as soon as she can get a retesting done or in 3-4 weeks from now.  More than 50 % of the time was spent for psychoeducation and supportive psychotherapy and care coordination.  This note was generated in part or whole with voice recognition software. Voice recognition is usually quite accurate but there are transcription errors that can and very often do occur. I apologize for any typographical errors that were not detected and corrected.          Ursula Alert, MD 12/01/2017, 8:53 AM

## 2017-12-01 ENCOUNTER — Encounter: Payer: Self-pay | Admitting: Psychiatry

## 2017-12-07 DIAGNOSIS — D649 Anemia, unspecified: Secondary | ICD-10-CM | POA: Diagnosis not present

## 2017-12-07 DIAGNOSIS — K5909 Other constipation: Secondary | ICD-10-CM | POA: Diagnosis not present

## 2017-12-07 DIAGNOSIS — R945 Abnormal results of liver function studies: Secondary | ICD-10-CM | POA: Diagnosis not present

## 2017-12-08 ENCOUNTER — Other Ambulatory Visit: Payer: Self-pay | Admitting: Gastroenterology

## 2017-12-08 DIAGNOSIS — D649 Anemia, unspecified: Secondary | ICD-10-CM

## 2017-12-08 DIAGNOSIS — R945 Abnormal results of liver function studies: Secondary | ICD-10-CM

## 2017-12-08 DIAGNOSIS — R7989 Other specified abnormal findings of blood chemistry: Secondary | ICD-10-CM

## 2017-12-13 ENCOUNTER — Ambulatory Visit: Payer: Medicare HMO

## 2017-12-19 ENCOUNTER — Other Ambulatory Visit: Payer: Self-pay | Admitting: Psychiatry

## 2018-01-19 ENCOUNTER — Other Ambulatory Visit: Payer: Self-pay | Admitting: Psychiatry

## 2018-01-19 DIAGNOSIS — Z8659 Personal history of other mental and behavioral disorders: Secondary | ICD-10-CM

## 2018-01-19 DIAGNOSIS — F3160 Bipolar disorder, current episode mixed, unspecified: Secondary | ICD-10-CM

## 2018-01-20 DIAGNOSIS — M21622 Bunionette of left foot: Secondary | ICD-10-CM | POA: Diagnosis not present

## 2018-01-20 DIAGNOSIS — M2042 Other hammer toe(s) (acquired), left foot: Secondary | ICD-10-CM | POA: Diagnosis not present

## 2018-01-20 DIAGNOSIS — M2012 Hallux valgus (acquired), left foot: Secondary | ICD-10-CM | POA: Diagnosis not present

## 2018-01-21 ENCOUNTER — Other Ambulatory Visit: Payer: Self-pay | Admitting: Podiatry

## 2018-01-25 ENCOUNTER — Encounter: Payer: Self-pay | Admitting: *Deleted

## 2018-01-27 ENCOUNTER — Other Ambulatory Visit: Payer: Self-pay

## 2018-02-02 ENCOUNTER — Ambulatory Visit
Admission: RE | Admit: 2018-02-02 | Discharge: 2018-02-02 | Disposition: A | Discharge status: Home / Self Care | Payer: Medicare HMO | Source: Ambulatory Visit | Attending: Podiatry | Admitting: Podiatry

## 2018-02-02 ENCOUNTER — Ambulatory Visit: Payer: Medicare HMO | Admitting: Anesthesiology

## 2018-02-02 ENCOUNTER — Encounter
Admission: RE | Disposition: A | Discharge status: Home / Self Care | Payer: Self-pay | Source: Ambulatory Visit | Attending: Podiatry

## 2018-02-02 DIAGNOSIS — F329 Major depressive disorder, single episode, unspecified: Secondary | ICD-10-CM | POA: Insufficient documentation

## 2018-02-02 DIAGNOSIS — M216X2 Other acquired deformities of left foot: Secondary | ICD-10-CM | POA: Diagnosis not present

## 2018-02-02 DIAGNOSIS — Z791 Long term (current) use of non-steroidal anti-inflammatories (NSAID): Secondary | ICD-10-CM | POA: Diagnosis not present

## 2018-02-02 DIAGNOSIS — M797 Fibromyalgia: Secondary | ICD-10-CM | POA: Insufficient documentation

## 2018-02-02 DIAGNOSIS — I10 Essential (primary) hypertension: Secondary | ICD-10-CM | POA: Insufficient documentation

## 2018-02-02 DIAGNOSIS — M2012 Hallux valgus (acquired), left foot: Secondary | ICD-10-CM | POA: Diagnosis not present

## 2018-02-02 DIAGNOSIS — E559 Vitamin D deficiency, unspecified: Secondary | ICD-10-CM | POA: Diagnosis not present

## 2018-02-02 DIAGNOSIS — G8918 Other acute postprocedural pain: Secondary | ICD-10-CM | POA: Diagnosis not present

## 2018-02-02 DIAGNOSIS — Z79899 Other long term (current) drug therapy: Secondary | ICD-10-CM | POA: Insufficient documentation

## 2018-02-02 DIAGNOSIS — M21622 Bunionette of left foot: Secondary | ICD-10-CM | POA: Insufficient documentation

## 2018-02-02 DIAGNOSIS — F419 Anxiety disorder, unspecified: Secondary | ICD-10-CM | POA: Diagnosis not present

## 2018-02-02 DIAGNOSIS — M898X7 Other specified disorders of bone, ankle and foot: Secondary | ICD-10-CM | POA: Diagnosis not present

## 2018-02-02 DIAGNOSIS — Z96651 Presence of right artificial knee joint: Secondary | ICD-10-CM | POA: Diagnosis not present

## 2018-02-02 DIAGNOSIS — G4700 Insomnia, unspecified: Secondary | ICD-10-CM | POA: Insufficient documentation

## 2018-02-02 DIAGNOSIS — M79672 Pain in left foot: Secondary | ICD-10-CM | POA: Diagnosis not present

## 2018-02-02 DIAGNOSIS — M2042 Other hammer toe(s) (acquired), left foot: Secondary | ICD-10-CM | POA: Diagnosis not present

## 2018-02-02 DIAGNOSIS — Z823 Family history of stroke: Secondary | ICD-10-CM | POA: Insufficient documentation

## 2018-02-02 DIAGNOSIS — Z87891 Personal history of nicotine dependence: Secondary | ICD-10-CM | POA: Diagnosis not present

## 2018-02-02 HISTORY — PX: WEIL OSTEOTOMY: SHX5044

## 2018-02-02 HISTORY — PX: HAMMER TOE SURGERY: SHX385

## 2018-02-02 HISTORY — PX: HALLUX VALGUS LAPIDUS: SHX6626

## 2018-02-02 HISTORY — DX: Adverse effect of unspecified anesthetic, initial encounter: T41.45XA

## 2018-02-02 HISTORY — DX: Presence of dental prosthetic device (complete) (partial): Z97.2

## 2018-02-02 HISTORY — DX: Fibromyalgia: M79.7

## 2018-02-02 HISTORY — DX: Essential (primary) hypertension: I10

## 2018-02-02 HISTORY — DX: Other complications of anesthesia, initial encounter: T88.59XA

## 2018-02-02 SURGERY — BUNIONECTOMY, LAPIDUS
Anesthesia: Regional | Site: Foot | Laterality: Left | Wound class: "Clean "

## 2018-02-02 MED ORDER — LACTATED RINGERS IV SOLN: INTRAVENOUS | Status: DC

## 2018-02-02 MED ORDER — OXYCODONE-ACETAMINOPHEN 7.5-325 MG PO TABS: 2.0000 | ORAL_TABLET | Freq: Three times a day (TID) | ORAL | 0 refills | Status: DC | PRN

## 2018-02-02 MED ORDER — BUPIVACAINE LIPOSOME 1.3 % IJ SUSP
INTRAMUSCULAR | Status: DC | PRN
  Administered 2018-02-02: 10 mL

## 2018-02-02 MED ORDER — PHENYLEPHRINE HCL 10 MG/ML IJ SOLN
INTRAMUSCULAR | Status: DC | PRN
  Administered 2018-02-02 (×2): 50 ug via INTRAVENOUS

## 2018-02-02 MED ORDER — FENTANYL CITRATE (PF) 100 MCG/2ML IJ SOLN: 25.0000 ug | INTRAMUSCULAR | Status: DC | PRN

## 2018-02-02 MED ORDER — MIDAZOLAM HCL 5 MG/5ML IJ SOLN
INTRAMUSCULAR | Status: DC | PRN
  Administered 2018-02-02: 2 mg via INTRAVENOUS

## 2018-02-02 MED ORDER — EPHEDRINE SULFATE 50 MG/ML IJ SOLN
INTRAMUSCULAR | Status: DC | PRN
  Administered 2018-02-02 (×2): 5 mg via INTRAVENOUS
  Administered 2018-02-02 (×2): 10 mg via INTRAVENOUS
  Administered 2018-02-02: 5 mg via INTRAVENOUS
  Administered 2018-02-02: 10 mg via INTRAVENOUS

## 2018-02-02 MED ORDER — POVIDONE-IODINE 7.5 % EX SOLN
Freq: Once | CUTANEOUS | Status: AC
  Administered 2018-02-02: 12:00:00 via TOPICAL

## 2018-02-02 MED ORDER — ONDANSETRON HCL 4 MG/2ML IJ SOLN
INTRAMUSCULAR | Status: DC | PRN
  Administered 2018-02-02: 4 mg via INTRAVENOUS

## 2018-02-02 MED ORDER — LACTATED RINGERS IV SOLN
INTRAVENOUS | Status: DC | PRN
  Administered 2018-02-02 (×2): via INTRAVENOUS

## 2018-02-02 MED ORDER — ROPIVACAINE HCL 5 MG/ML IJ SOLN
INTRAMUSCULAR | Status: DC | PRN
  Administered 2018-02-02: 150 mg via PERINEURAL

## 2018-02-02 MED ORDER — BUPIVACAINE HCL (PF) 0.25 % IJ SOLN
INTRAMUSCULAR | Status: DC | PRN
  Administered 2018-02-02: 10 mL

## 2018-02-02 MED ORDER — LIDOCAINE HCL (CARDIAC) PF 100 MG/5ML IV SOSY
PREFILLED_SYRINGE | INTRAVENOUS | Status: DC | PRN
  Administered 2018-02-02: 40 mg via INTRATRACHEAL

## 2018-02-02 MED ORDER — CEFAZOLIN SODIUM-DEXTROSE 2-4 GM/100ML-% IV SOLN
2.0000 g | INTRAVENOUS | Status: AC
  Administered 2018-02-02: 2 g via INTRAVENOUS

## 2018-02-02 MED ORDER — CHLORHEXIDINE GLUCONATE 4 % EX LIQD
60.0000 mL | Freq: Once | CUTANEOUS | Status: AC
  Administered 2018-02-02: 4 via TOPICAL

## 2018-02-02 MED ORDER — OXYCODONE HCL 5 MG/5ML PO SOLN: 5.0000 mg | Freq: Once | ORAL | Status: DC | PRN

## 2018-02-02 MED ORDER — DEXAMETHASONE SODIUM PHOSPHATE 4 MG/ML IJ SOLN
INTRAMUSCULAR | Status: DC | PRN
Start: 2018-02-02 — End: 2018-02-02
  Administered 2018-02-02: 4 mg via INTRAVENOUS

## 2018-02-02 MED ORDER — PROPOFOL 10 MG/ML IV BOLUS
INTRAVENOUS | Status: DC | PRN
  Administered 2018-02-02: 180 mg via INTRAVENOUS

## 2018-02-02 MED ORDER — GLYCOPYRROLATE 0.2 MG/ML IJ SOLN
INTRAMUSCULAR | Status: DC | PRN
  Administered 2018-02-02: 0.1 mg via INTRAVENOUS

## 2018-02-02 MED ORDER — OXYCODONE HCL 5 MG PO TABS: 5.0000 mg | ORAL_TABLET | Freq: Once | ORAL | Status: DC | PRN

## 2018-02-02 MED ORDER — FENTANYL CITRATE (PF) 100 MCG/2ML IJ SOLN
INTRAMUSCULAR | Status: DC | PRN
  Administered 2018-02-02: 100 ug via INTRAVENOUS

## 2018-02-02 SURGICAL SUPPLY — 60 items
BANDAGE ELASTIC 4 VELCRO NS (GAUZE/BANDAGES/DRESSINGS) ×2 IMPLANT
BENZOIN TINCTURE PRP APPL 2/3 (GAUZE/BANDAGES/DRESSINGS) ×2 IMPLANT
BLADE OSC/SAGITTAL 5.5X25 (BLADE) ×1 IMPLANT
BLADE OSC/SAGITTAL MD 5.5X18 (BLADE) ×1 IMPLANT
BLADE OSC/SAGITTAL MD 9X18.5 (BLADE) ×1 IMPLANT
BLADE SURG 15 STRL LF DISP TIS (BLADE) IMPLANT
BLADE SURG 15 STRL SS (BLADE) ×4
BNDG COHESIVE 4X5 TAN STRL (GAUZE/BANDAGES/DRESSINGS) ×2 IMPLANT
BNDG ESMARK 4X12 TAN STRL LF (GAUZE/BANDAGES/DRESSINGS) ×2 IMPLANT
BNDG GAUZE 4.5X4.1 6PLY STRL (MISCELLANEOUS) ×2 IMPLANT
BNDG STRETCH 4X75 STRL LF (GAUZE/BANDAGES/DRESSINGS) ×2 IMPLANT
CANISTER SUCT 1200ML W/VALVE (MISCELLANEOUS) ×2 IMPLANT
CNTRSNK DRL 2 SCR (MISCELLANEOUS) IMPLANT
CONTROL 360 (Bone Implant) ×1 IMPLANT
COUNTERSINK 2.0 (MISCELLANEOUS) ×1
COVER LIGHT HANDLE UNIVERSAL (MISCELLANEOUS) ×4 IMPLANT
CUFF TOURN SGL QUICK 18 (TOURNIQUET CUFF) ×1 IMPLANT
DRAPE FLUOR MINI C-ARM 54X84 (DRAPES) ×2 IMPLANT
DURAPREP 26ML APPLICATOR (WOUND CARE) ×2 IMPLANT
ELECT REM PT RETURN 9FT ADLT (ELECTROSURGICAL) ×2
ELECTRODE REM PT RTRN 9FT ADLT (ELECTROSURGICAL) ×1 IMPLANT
FIXATION HAMMERTOE ANGLD 15MM (Toe) IMPLANT
GAUZE PETRO XEROFOAM 1X8 (MISCELLANEOUS) ×3 IMPLANT
GAUZE SPONGE 4X4 12PLY STRL (GAUZE/BANDAGES/DRESSINGS) ×2 IMPLANT
GLOVE BIO SURGEON STRL SZ7.5 (GLOVE) ×3 IMPLANT
GLOVE INDICATOR 8.0 STRL GRN (GLOVE) ×3 IMPLANT
GOWN STRL REUS W/ TWL LRG LVL3 (GOWN DISPOSABLE) ×2 IMPLANT
GOWN STRL REUS W/TWL LRG LVL3 (GOWN DISPOSABLE) ×2
HAMMERTOE ANGLED 15MM 5MM (Toe) ×4 IMPLANT
K-WIRE .9X150 (WIRE) ×2
K-WIRE DBL END TROCAR 6X.045 (WIRE) ×2
K-WIRE DBL END TROCAR 6X.062 (WIRE)
K-WIRE DBL TROCAR .045X4 ×2 IMPLANT
KIT DRILL HAMMERLOCK2 IMPLANT (BIT) ×1 IMPLANT
KIT TURNOVER KIT A (KITS) ×2 IMPLANT
KWIRE .9X150 (WIRE) IMPLANT
KWIRE DBL END TROCAR 6X.045 (WIRE) IMPLANT
KWIRE DBL END TROCAR 6X.062 (WIRE) IMPLANT
KWIRE DBL TROCAR .045X4 IMPLANT
NS IRRIG 500ML POUR BTL (IV SOLUTION) ×2 IMPLANT
PACK EXTREMITY ARMC (MISCELLANEOUS) ×2 IMPLANT
PENCIL SMOKE EVACUATOR (MISCELLANEOUS) ×2 IMPLANT
PIN BALLS 3/8 F/.045 WIRE (MISCELLANEOUS) ×1 IMPLANT
RASP SM TEAR CROSS CUT (RASP) ×1 IMPLANT
SCREW HEADLESS SHRT THRD 2X10 (Screw) ×2 IMPLANT
STAPLE SPR MET 9X9X9 1.5 (Staple) ×1 IMPLANT
STOCKINETTE IMPERVIOUS LG (DRAPES) ×2 IMPLANT
STRIP CLOSURE SKIN 1/4X4 (GAUZE/BANDAGES/DRESSINGS) ×2 IMPLANT
SUT ETHILON 5-0 FS-2 18 BLK (SUTURE) ×2 IMPLANT
SUT MNCRL 5-0+ PC-1 (SUTURE) IMPLANT
SUT MONOCRYL 5-0 (SUTURE) ×2
SUT VIC AB 2-0 SH 27 (SUTURE)
SUT VIC AB 2-0 SH 27XBRD (SUTURE) IMPLANT
SUT VIC AB 3-0 SH 27 (SUTURE) ×1
SUT VIC AB 3-0 SH 27X BRD (SUTURE) IMPLANT
SUT VIC AB 4-0 FS2 27 (SUTURE) ×1 IMPLANT
SUT VIC AB 4-0 PS2 18 (SUTURE) ×3 IMPLANT
SUT VIC AB 4-0 RB1 27 (SUTURE) ×1
SUT VIC AB 4-0 RB1 27X BRD (SUTURE) IMPLANT
TREACE PLANTAR PYTHON LEFT (Plate) ×1 IMPLANT

## 2018-02-02 NOTE — Anesthesia Procedure Notes (Signed)
Anesthesia Regional Block: Adductor canal block   Pre-Anesthetic Checklist: ,, timeout performed, Correct Patient, Correct Site, Correct Laterality, Correct Procedure, Correct Position, site marked, Risks and benefits discussed,  Surgical consent,  Pre-op evaluation,  At surgeon's request and post-op pain management  Laterality: Left  Prep: chloraprep       Needles:  Injection technique: Single-shot  Needle Type: Echogenic Needle     Needle Length: 9cm  Needle Gauge: 21     Additional Needles:   Procedures:,,,, ultrasound used (permanent image in chart),,,,  Narrative:  Start time: 02/02/2018 11:37 AM End time: 02/02/2018 11:41 AM Injection made incrementally with aspirations every 5 mL.  Performed by: Personally  Anesthesiologist: Elgie Collard, MD  Additional Notes: Functioning IV was confirmed and monitors applied. Ultrasound guidance: relevant anatomy identified, needle position confirmed, local anesthetic spread visualized around nerve(s)., vascular puncture avoided.  Image printed for medical record.  Negative aspiration and no paresthesias; incremental administration of local anesthetic. The patient tolerated the procedure well. Vitals signes recorded in RN notes.

## 2018-02-02 NOTE — H&P (Signed)
HISTORY AND PHYSICAL INTERVAL NOTE:  02/02/2018  11:46 AM  Gina Wallace  has presented today for surgery, with the diagnosis of Hallux Valgus of left foot [employer] bunion-left foot.  The various methods of treatment have been discussed with the patient.  No guarantees were given.  After consideration of risks, benefits and other options for treatment, the patient has consented to surgery.  I have reviewed the patients' chart and labs.    No data found.  A history and physical examination was performed in my office.  The patient was reexamined.  There have been no changes to this history and physical examination.  Samara Deist A

## 2018-02-02 NOTE — Op Note (Signed)
Operative note   Surgeon:Jennefer Kopp Lawyer: None    Preop diagnosis: 1.  Hallux valgus left foot 2.  Hammertoe contracture left second and third toes 3.  Elongated second and third metatarsals 4.  Tailor's bunion left foot    Postop diagnosis: Same    Procedure:1.  Lapidus hallux valgus correction left foot 2.  Akin proximal phalanx osteotomy left great toe.  3.  Hammertoe repair with hammerlock implant second and third toe PIPJ 4.  Weil second and third metatarsal osteotomies left foot 5.  Reverse Austin fifth metatarsal osteotomy    EBL: Minimal    Anesthesia:local, regional and general    Hemostasis: Ankle tourniquet inflated to 200 mmHg initially for 118 minutes drop for 20 minutes and reinflated for another 45 minutes    Specimen: None    Complications: None    Operative indications:Gina Wallace is an 62 y.o. that presents today for surgical intervention.  The risks/benefits/alternatives/complications have been discussed and consent has been given.    Procedure:  Patient was brought into the OR and placed on the operating table in thesupine position. After anesthesia was obtained theleft lower extremity was prepped and draped in usual sterile fashion.  Attention was directed to the dorsal first metatarsocuneiform incision was performed just medial to the extensor tendon.  Sharp and blunt dissection carried down to the capsule.  Subperiosteal dissection was undertaken exposing the first met cuneiform joint.  The joint was then freed.  Attention was directed to the dorsal medial first MTPJ where a dorsomedial incision was performed.  Sharp and blunt dissection carried down to the capsule.  The first intermetatarsal space was entered in the [place] L transected and the conjoined tendon of the abductor was removed from the base of the proximal phalanx.  Attention was redirected to the first met cuneiform where this was repositioned with the lapiplasty positioner.  Next the cut  guide was placed overlying the joint seeker that was placed into the joint.  To performing cuts were then made.  The residual pieces were removed from the fusion site.  Good removal of bone down through the subchondral bone plate exposing good healthy bleeding bone was noted.  This wound was then flushed with copious amounts of irrigation.  It was drilled and prepared for the fusion.  Impression device was placed overlying the joint and in all of wire compressing the joint was placed with a second wire stabilizing.  Dorsal locking plate was placed and the python medial locking plate was placed first for the stability.  Good realignment was noted at the MTPJ with residual valgus of the great toe.  The dorsomedial incision was carried to the base of the proximal phalanx.  An Akin osteotomy was created and this was stabilized with a compression staple.  The residual dorsal medial eminence was noted and transected and smoothed with a power rasp.  Areas were flushed and closed with 3-0 Vicryl 4-0 Vicryl and Monocryl.  Attention was directed to the second and third toes where a curvilinear incision was performed from the metatarsal across the MTPJ to the PIPJ's.  Sharp and blunt dissection carried down to the long extensor tendon.  Long extensor tendon of the second and third toes were reflected at the PIPJ.  Head of the proximal phalanx and base of the middle phalanx were exposed and transected with a power saw.  This was prepared for the hammertoe repairs.  Attention was directed to the metatarsal phalangeal joint of the  second and third.  The the dorsal aspect of the metatarsal head of the second and third were exposed.  Next Weil osteotomies were created and the capital fragments were allowed to translocate more proximal.  These were stabilized with 10 mm compression screws from the Paragon screw set.  Next 2 hammerlock implants were placed in the PIPJ of the second and third toes.  These were medium curved  implants.  Good alignment was noted.  Wounds were flushed with copious amounts irrigation.  The extensor tendons were repaired with a 4-0 Vicryl and the subtenons tissue repaired with 4-0 Vicryl.  Skin was repaired with a 4-0 nylon.  Attention was directed to the dorsal lateral fifth MTPJ where dorsal lateral incision was performed.  Sharp and blunt dissection carried down to the capsule.  Longitudinal capsulotomy was performed.  The dorsal lateral eminence was noted.  A guidewire for the ensuing osteotomy was placed.  A V osteotomy was created.  The capital fragment was translocated medially.  This was stabilized with a 0.045 K wire.  Good stability was noted.  Good realignment was noted.  The ensuing overhanging ledge was transected with a power saw.  All wounds were flushed with copious amounts irrigation.  Closure was performed with a 3-0 and 4-0 Vicryl and a 5-0 Monocryl for skin.  All areas were infiltrated with a one-to-one mixture of 0.25% bupivacaine and Exparel long-acting anesthetic.  A total of 20 cc was used.  Patient was placed in a compressive sterile dressing and placed in an equalizer walker boot with foot at 90 degrees.    Patient tolerated the procedure and anesthesia well.  Was transported from the OR to the PACU with all vital signs stable and vascular status intact. To be discharged per routine protocol.  Will follow up in approximately 1 week in the outpatient clinic.

## 2018-02-02 NOTE — Anesthesia Procedure Notes (Signed)
Anesthesia Regional Block: Popliteal block   Pre-Anesthetic Checklist: ,, timeout performed, Correct Patient, Correct Site, Correct Laterality, Correct Procedure, Correct Position, site marked, Risks and benefits discussed,  Surgical consent,  Pre-op evaluation,  At surgeon's request and post-op pain management  Laterality: Left  Prep: chloraprep       Needles:  Injection technique: Single-shot  Needle Type: Echogenic Needle     Needle Length: 9cm  Needle Gauge: 21     Additional Needles:   Procedures:,,,, ultrasound used (permanent image in chart),,,,  Narrative:  Start time: 02/02/2018 11:31 AM End time: 02/02/2018 11:37 AM Injection made incrementally with aspirations every 5 mL.  Performed by: Personally  Anesthesiologist: Elgie Collard, MD  Additional Notes: Functioning IV was confirmed and monitors applied. Ultrasound guidance: relevant anatomy identified, needle position confirmed, local anesthetic spread visualized around nerve(s)., vascular puncture avoided.  Image printed for medical record.  Negative aspiration and no paresthesias; incremental administration of local anesthetic. The patient tolerated the procedure well. Vitals signes recorded in RN notes.

## 2018-02-02 NOTE — Progress Notes (Addendum)
Assisted Kandice Robinsons ANMD with left, ultrasound guided popliteal. Side rails up, monitors on throughout procedure. See vital signs in flow sheet. Tolerated Procedure well.

## 2018-02-02 NOTE — Transfer of Care (Signed)
Immediate Anesthesia Transfer of Care Note  Patient: Kadra Kohan  Procedure(s) Performed: HALLUX VALGUS LAPIDUS (Left Foot) WEIL OSTEOTOMY X 2, REVERSE AUSTIN, AND AIKEN (Left Foot) HAMMER TOE CORRECTION - T1 & T2 (Left Foot)  Patient Location: PACU  Anesthesia Type: General LMA, Regional  Level of Consciousness: awake, alert  and patient cooperative  Airway and Oxygen Therapy: Patient Spontanous Breathing and Patient connected to supplemental oxygen  Post-op Assessment: Post-op Vital signs reviewed, Patient's Cardiovascular Status Stable, Respiratory Function Stable, Patent Airway and No signs of Nausea or vomiting  Post-op Vital Signs: Reviewed and stable  Complications: No apparent anesthesia complications

## 2018-02-02 NOTE — Anesthesia Preprocedure Evaluation (Signed)
Anesthesia Evaluation  Patient identified by MRN, date of birth, ID band Patient awake    Reviewed: Allergy & Precautions, H&P , NPO status , Patient's Chart, lab work & pertinent test results  Airway Mallampati: II  TM Distance: >3 FB Neck ROM: full    Dental  (+) Edentulous Upper, Edentulous Lower   Pulmonary former smoker,    Pulmonary exam normal breath sounds clear to auscultation       Cardiovascular hypertension, On Medications Normal cardiovascular exam     Neuro/Psych Anxiety Depression    GI/Hepatic negative GI ROS, Neg liver ROS,   Endo/Other  negative endocrine ROS  Renal/GU negative Renal ROS     Musculoskeletal   Abdominal   Peds  Hematology negative hematology ROS (+)   Anesthesia Other Findings   Reproductive/Obstetrics                             Anesthesia Physical Anesthesia Plan  ASA: II  Anesthesia Plan: General LMA and Regional   Post-op Pain Management: GA combined w/ Regional for post-op pain   Induction:   PONV Risk Score and Plan:   Airway Management Planned:   Additional Equipment:   Intra-op Plan:   Post-operative Plan:   Informed Consent: I have reviewed the patients History and Physical, chart, labs and discussed the procedure including the risks, benefits and alternatives for the proposed anesthesia with the patient or authorized representative who has indicated his/her understanding and acceptance.     Plan Discussed with:   Anesthesia Plan Comments:         Anesthesia Quick Evaluation

## 2018-02-02 NOTE — Anesthesia Postprocedure Evaluation (Signed)
Anesthesia Post Note  Patient: Jazleen Robeck  Procedure(s) Performed: HALLUX VALGUS LAPIDUS (Left Foot) WEIL OSTEOTOMY X 2, REVERSE AUSTIN, AND AIKEN (Left Foot) HAMMER TOE CORRECTION - T1 & T2 (Left Foot)  Patient location during evaluation: PACU Anesthesia Type: Regional Level of consciousness: awake and alert Pain management: pain level controlled Vital Signs Assessment: post-procedure vital signs reviewed and stable Respiratory status: spontaneous breathing Cardiovascular status: blood pressure returned to baseline Postop Assessment: no headache Anesthetic complications: no    Jaci Standard, III,  Meeyah Ovitt D

## 2018-02-02 NOTE — Discharge Instructions (Signed)
[place] [address] [place]   1. Take your medication as prescribed.  Pain medication should be taken only as needed.  2. Keep the dressing clean, dry and intact.  3. Keep your foot elevated above the heart level for the first 48 hours.  4. Walking to the bathroom and brief periods of walking are acceptable, unless we have instructed you to be non-weight bearing.  5. Always use your crutches if you are to be non-weight bearing.  6. Do not take a shower. Baths are permissible as long as the foot is kept out of the water.   7. Every hour you are awake:  - Bend your knee 15 times.  8. Call [facility] [phone]) if any of the following problems occur: - You develop a temperature or fever. - The bandage becomes saturated with blood. - Medication does not stop your pain. - Injury of the foot occurs. - Any symptoms of infection including redness, odor, or red streaks running from wound. General Anesthesia, Adult, Care After These instructions provide you with information about caring for yourself after your procedure. Your health care provider may also give you more specific instructions. Your treatment has been planned according to current medical practices, but problems sometimes occur. Call your health care provider if you have any problems or questions after your procedure. What can I expect after the procedure? After the procedure, it is common to have:  Vomiting.  A sore throat.  Mental slowness.  It is common to feel:  Nauseous.  Cold or shivery.  Sleepy.  Tired.  Sore or achy, even in parts of your body where you did not have surgery.  Follow these instructions at home: For at least 24 hours after the procedure:  Do not: ? Participate in activities where you could fall or become injured. ? Drive. ? Use heavy machinery. ? Drink  alcohol. ? Take sleeping pills or medicines that cause drowsiness. ? Make important decisions or sign legal documents. ? Take care of children on your own.  Rest. Eating and drinking  If you vomit, drink water, juice, or soup when you can drink without vomiting.  Drink enough fluid to keep your urine clear or pale yellow.  Make sure you have little or no nausea before eating solid foods.  Follow the diet recommended by your health care provider. General instructions  Have a responsible adult stay with you until you are awake and alert.  Return to your normal activities as told by your health care provider. Ask your health care provider what activities are safe for you.  Take over-the-counter and prescription medicines only as told by your health care provider.  If you smoke, do not smoke without supervision.  Keep all follow-up visits as told by your health care provider. This is important. Contact a health care provider if:  You continue to have nausea or vomiting at home, and medicines are not helpful.  You cannot drink fluids or start eating again.  You cannot urinate after 8-12 hours.  You develop a skin rash.  You have fever.  You have increasing redness at the site of your procedure. Get help right away if:  You have difficulty breathing.  You have chest pain.  You have unexpected bleeding.  You feel that you are having a life-threatening or urgent problem. This information is not intended to replace advice given to you by your  health care provider. Make sure you discuss any questions you have with your health care provider. Document Released: 10/05/2000 Document Revised: 12/02/2015 Document Reviewed: 06/13/2015 Elsevier Interactive Patient Education  [employer].

## 2018-02-02 NOTE — Anesthesia Procedure Notes (Signed)
Procedure Name: LMA Insertion Date/Time: 02/02/2018 12:09 PM Performed by: Mayme Genta, CRNA Pre-anesthesia Checklist: Patient identified, Emergency Drugs available, Suction available, Timeout performed and Patient being monitored Patient Re-evaluated:Patient Re-evaluated prior to induction Oxygen Delivery Method: Circle system utilized Preoxygenation: Pre-oxygenation with 100% oxygen Induction Type: IV induction LMA: LMA inserted LMA Size: 4.0 Number of attempts: 1 Placement Confirmation: positive ETCO2 and breath sounds checked- equal and bilateral Tube secured with: Tape

## 2018-02-03 ENCOUNTER — Encounter: Payer: Self-pay | Admitting: Podiatry

## 2018-02-08 DIAGNOSIS — M2012 Hallux valgus (acquired), left foot: Secondary | ICD-10-CM | POA: Diagnosis not present

## 2018-02-14 ENCOUNTER — Other Ambulatory Visit: Payer: Self-pay | Admitting: Psychiatry

## 2018-02-15 DIAGNOSIS — M2012 Hallux valgus (acquired), left foot: Secondary | ICD-10-CM | POA: Diagnosis not present

## 2018-02-23 DIAGNOSIS — F5104 Psychophysiologic insomnia: Secondary | ICD-10-CM | POA: Diagnosis not present

## 2018-02-23 DIAGNOSIS — F909 Attention-deficit hyperactivity disorder, unspecified type: Secondary | ICD-10-CM | POA: Diagnosis not present

## 2018-02-23 DIAGNOSIS — I10 Essential (primary) hypertension: Secondary | ICD-10-CM | POA: Diagnosis not present

## 2018-03-15 DIAGNOSIS — M2012 Hallux valgus (acquired), left foot: Secondary | ICD-10-CM | POA: Diagnosis not present

## 2018-04-28 DIAGNOSIS — E559 Vitamin D deficiency, unspecified: Secondary | ICD-10-CM | POA: Diagnosis not present

## 2018-04-28 DIAGNOSIS — F909 Attention-deficit hyperactivity disorder, unspecified type: Secondary | ICD-10-CM | POA: Diagnosis not present

## 2018-04-28 DIAGNOSIS — M2550 Pain in unspecified joint: Secondary | ICD-10-CM | POA: Diagnosis not present

## 2018-04-28 DIAGNOSIS — R5383 Other fatigue: Secondary | ICD-10-CM | POA: Diagnosis not present

## 2018-04-28 DIAGNOSIS — G8929 Other chronic pain: Secondary | ICD-10-CM | POA: Diagnosis not present

## 2018-04-28 DIAGNOSIS — E538 Deficiency of other specified B group vitamins: Secondary | ICD-10-CM | POA: Diagnosis not present

## 2018-04-28 DIAGNOSIS — M2012 Hallux valgus (acquired), left foot: Secondary | ICD-10-CM | POA: Diagnosis not present

## 2018-04-29 ENCOUNTER — Telehealth: Payer: Self-pay

## 2018-04-29 NOTE — Telephone Encounter (Signed)
Thank you :)

## 2018-04-29 NOTE — Telephone Encounter (Signed)
Received notes from patients prior medical office in [place], but Dr. Shea Evans has requested that the patient has ADHD testing complete. Called and spoke with the patient about this and she stated that she is willing to undergo the testing. Gave the patient information to the referral office [facility] in [place], gave the patient the address,phone number and informed the patient that the office will be in contact with her.

## 2018-05-04 ENCOUNTER — Telehealth: Payer: Self-pay

## 2018-05-04 NOTE — Telephone Encounter (Signed)
Please ask her to contact her health insurance plan and ask for places that offer this. I had given her a location several months ago in [place] , she could also go to them.

## 2018-05-04 NOTE — Telephone Encounter (Signed)
pt called states that the place you referred her to for adhd test does not take her insurance and it going to cost over a $1000.  pt can not afford that.  is there somewhere else she can go too.

## 2018-05-16 DIAGNOSIS — H353124 Nonexudative age-related macular degeneration, left eye, advanced atrophic with subfoveal involvement: Secondary | ICD-10-CM | POA: Diagnosis not present

## 2018-05-16 DIAGNOSIS — H33192 Other retinoschisis and retinal cysts, left eye: Secondary | ICD-10-CM | POA: Diagnosis not present

## 2018-05-16 DIAGNOSIS — H35372 Puckering of macula, left eye: Secondary | ICD-10-CM | POA: Diagnosis not present

## 2018-05-16 DIAGNOSIS — H353113 Nonexudative age-related macular degeneration, right eye, advanced atrophic without subfoveal involvement: Secondary | ICD-10-CM | POA: Diagnosis not present

## 2018-06-01 ENCOUNTER — Telehealth: Payer: Self-pay

## 2018-06-01 NOTE — Telephone Encounter (Signed)
received fax that patient does not wish to schedule

## 2018-06-01 NOTE — Telephone Encounter (Signed)
faxed and confirmed orders for services

## 2018-06-01 NOTE — Telephone Encounter (Signed)
faxed and confirmed status report

## 2018-06-01 NOTE — Telephone Encounter (Signed)
thanks

## 2018-06-07 DIAGNOSIS — M2012 Hallux valgus (acquired), left foot: Secondary | ICD-10-CM | POA: Diagnosis not present

## 2018-06-08 ENCOUNTER — Other Ambulatory Visit: Payer: Self-pay | Admitting: Internal Medicine

## 2018-06-08 DIAGNOSIS — Z1231 Encounter for screening mammogram for malignant neoplasm of breast: Secondary | ICD-10-CM

## 2018-06-16 ENCOUNTER — Ambulatory Visit
Admission: RE | Admit: 2018-06-16 | Discharge: 2018-06-16 | Disposition: A | Discharge status: Home / Self Care | Payer: Medicare HMO | Source: Ambulatory Visit | Attending: Internal Medicine | Admitting: Internal Medicine

## 2018-06-16 DIAGNOSIS — Z1231 Encounter for screening mammogram for malignant neoplasm of breast: Secondary | ICD-10-CM

## 2018-06-21 ENCOUNTER — Ambulatory Visit (INDEPENDENT_AMBULATORY_CARE_PROVIDER_SITE_OTHER): Payer: Medicare HMO | Admitting: Psychiatry

## 2018-06-21 DIAGNOSIS — F419 Anxiety disorder, unspecified: Secondary | ICD-10-CM

## 2018-06-21 DIAGNOSIS — F902 Attention-deficit hyperactivity disorder, combined type: Secondary | ICD-10-CM

## 2018-06-21 DIAGNOSIS — F325 Major depressive disorder, single episode, in full remission: Secondary | ICD-10-CM

## 2018-06-21 DIAGNOSIS — Z8659 Personal history of other mental and behavioral disorders: Secondary | ICD-10-CM | POA: Diagnosis not present

## 2018-06-21 MED ORDER — AMPHETAMINE-DEXTROAMPHETAMINE 30 MG PO TABS: 30.0000 mg | ORAL_TABLET | Freq: Every day | ORAL | 0 refills | Status: DC
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
## 2018-06-23 ENCOUNTER — Other Ambulatory Visit: Payer: Self-pay | Admitting: Psychiatry

## 2018-06-23 MED ORDER — BUPROPION HCL ER (XL) 150 MG PO TB24: ORAL_TABLET | ORAL | 0 refills | Status: DC

## 2018-07-18 ENCOUNTER — Ambulatory Visit (INDEPENDENT_AMBULATORY_CARE_PROVIDER_SITE_OTHER): Payer: Medicare HMO | Admitting: Mental Health

## 2018-07-18 ENCOUNTER — Encounter: Payer: Self-pay | Admitting: Mental Health

## 2018-07-18 ENCOUNTER — Ambulatory Visit (INDEPENDENT_AMBULATORY_CARE_PROVIDER_SITE_OTHER): Payer: Medicare HMO | Admitting: Psychiatry

## 2018-07-18 DIAGNOSIS — F902 Attention-deficit hyperactivity disorder, combined type: Secondary | ICD-10-CM

## 2018-07-18 DIAGNOSIS — F331 Major depressive disorder, recurrent, moderate: Secondary | ICD-10-CM

## 2018-07-18 DIAGNOSIS — F411 Generalized anxiety disorder: Secondary | ICD-10-CM

## 2018-07-18 DIAGNOSIS — F901 Attention-deficit hyperactivity disorder, predominantly hyperactive type: Secondary | ICD-10-CM

## 2018-07-18 DIAGNOSIS — F3160 Bipolar disorder, current episode mixed, unspecified: Secondary | ICD-10-CM | POA: Diagnosis not present

## 2018-07-18 DIAGNOSIS — F319 Bipolar disorder, unspecified: Secondary | ICD-10-CM

## 2018-07-18 DIAGNOSIS — Z8659 Personal history of other mental and behavioral disorders: Secondary | ICD-10-CM | POA: Diagnosis not present

## 2018-07-18 DIAGNOSIS — F419 Anxiety disorder, unspecified: Secondary | ICD-10-CM

## 2018-07-18 MED ORDER — AMPHETAMINE-DEXTROAMPHETAMINE 30 MG PO TABS: 30.0000 mg | ORAL_TABLET | Freq: Every day | ORAL | 0 refills | Status: DC

## 2018-07-18 MED ORDER — LITHIUM CARBONATE 300 MG PO TABS: ORAL_TABLET | ORAL | 0 refills | Status: DC

## 2018-07-18 MED ORDER — BUPROPION HCL ER (XL) 150 MG PO TB24: ORAL_TABLET | ORAL | 0 refills | Status: DC
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
## 2018-07-18 NOTE — Progress Notes (Signed)
[facility]  Patient ID: Gina Wallace,  MRN: [id number]  PCP: Glendon Axe, MD  Date of Evaluation: 07/18/2018 Time spent:20 minutes  Chief Complaint:   HISTORY/CURRENT STATUS: HPI patient seen initially 06/21/2018.  At that time was felt she had depression, anxiety, ADHD, and history of bipolar disorder.  He was to start Trintellix for the anxiety and depression but it was too expensive for her she requested going on Wellbutrin he is on Wellbutrin XL 300 a day continues to have problems with sleep and she will take anywhere from 2-1/2-3-1/2 trazodone the night. Her she is better as far as her focus to the Adderall.  Anxiety is not good and her depression is slightly worse. Today we discussed past manic episodes that can last from 2 days to 1 week she will feel invincible, impulsive, talks more, goal oriented, decreased sleep. She has tried lithium in the past with success.  Does have high blood pressure  Individual Medical History/ Review of Systems: Changes? :No   Allergies: Patient has no known allergies.  Current Medications:  Current Outpatient Medications:  .  amphetamine-dextroamphetamine (ADDERALL) 30 MG tablet, Take 1 tablet by mouth daily., Disp: 30 tablet, Rfl: 0 .  buPROPion (WELLBUTRIN XL) 150 MG 24 hr tablet, 1 in am for a week, then 2 in am., Disp: 60 tablet, Rfl: 0 .  traZODone (DESYREL) 100 MG tablet, TAKE 1-1.5 TABLETS (100-150 MG TOTAL) BY MOUTH AT BEDTIME. (Patient taking differently: Take 100-150 mg by mouth at bedtime. Pt takes 200 mg each night), Disp: 135 tablet, Rfl: 1 .  CANNABIDIOL PO, Take by mouth daily., Disp: , Rfl:  .  hydrOXYzine (VISTARIL) 25 MG capsule, Take 1 capsule (25 mg total) by mouth 3 (three) times daily as needed (SEVERE ANXIETY SX). (Patient not taking: Reported on 02/02/2018), Disp: 90 capsule, Rfl: 1 .  lisinopril (PRINIVIL,ZESTRIL) 5 MG tablet, Take by mouth. Pt takes 4 tabs each AM, Disp: , Rfl:  .  lithium 300 MG tablet,  1 a night for a week, then 2 hs, Disp: 60 tablet, Rfl: 0 .  OLANZapine (ZYPREXA) 5 MG tablet, Take 1.5 tablets (7.5 mg total) by mouth at bedtime. (Patient not taking: Reported on 07/18/2018), Disp: 45 tablet, Rfl: 1 .  oxyCODONE-acetaminophen (PERCOCET) 7.5-325 MG tablet, Take 2 tablets by mouth every 8 (eight) hours as needed for severe pain., Disp: 30 tablet, Rfl: 0 .  tiZANidine (ZANAFLEX) 4 MG capsule, Take by mouth as needed. , Disp: , Rfl:  Medication Side Effects: none  Family Medical/ Social History: Changes? no  MENTAL HEALTH EXAM:  There were no vitals taken for this visit.There is no height or weight on file to calculate BMI.  General Appearance: Casual  Eye Contact:  Good  Speech:  Clear and Coherent  Volume:  Normal talkative  Mood:  Depressed anxious  Affect:  Appropriate  Thought Process:  Linear  Orientation:  Full (Time, Place, and Person)  Thought Content: Logical   Suicidal Thoughts:  No  Homicidal Thoughts:  No  Memory:  WNL  Judgement:  Good  Insight:  Good  Psychomotor Activity:  Normal  Concentration:  Concentration: Good  Recall:  Good  Fund of Knowledge: Good  Language: Good  Assets:  Desire for Improvement  ADL's:  Intact  Cognition: WNL  Prognosis:  Good    DIAGNOSES:    ICD-10-CM   1. Attention deficit hyperactivity disorder (ADHD), combined type F90.2   2. Anxiety F41.9   3. Mixed bipolar I  disorder ([place]) F31.60   4. History of ADHD Z86.59 amphetamine-dextroamphetamine (ADDERALL) 30 MG tablet    Receiving Psychotherapy: No   To start with counselor soon,  RECOMMENDATIONS: Patient will start lithium to take 300 mg a day for a week and then 600 mg.  She will continue her other medications which include Adderall 30 mg a day, trazodone 3 50-4 59 (she gets this from her PCP).  Continue Wellbutrin XL 300 She is to get a lithium level TSH and a basic metabolic panel after she has been on lithium 600 for a week we will follow her lithium level as she  is on  Lisinopril.  We will also follow her blood pressure We need to get release of information from Dr. Candiss Norse, and Dr. Perlie Gold, we will get those at the next visit.  She was also given a mood diary.  She has had mood fluctuations but not severe fluctuations lately. She is we will see her again in a month   Comer Locket, [place]

## 2018-07-20 DIAGNOSIS — I10 Essential (primary) hypertension: Secondary | ICD-10-CM | POA: Diagnosis not present

## 2018-07-20 DIAGNOSIS — F5104 Psychophysiologic insomnia: Secondary | ICD-10-CM | POA: Diagnosis not present

## 2018-07-20 DIAGNOSIS — G8929 Other chronic pain: Secondary | ICD-10-CM | POA: Diagnosis not present

## 2018-07-23 DIAGNOSIS — G894 Chronic pain syndrome: Secondary | ICD-10-CM | POA: Insufficient documentation

## 2018-07-23 DIAGNOSIS — G8929 Other chronic pain: Secondary | ICD-10-CM

## 2018-08-01 ENCOUNTER — Ambulatory Visit: Payer: Medicare HMO | Admitting: Mental Health

## 2018-08-08 ENCOUNTER — Ambulatory Visit: Payer: Medicare HMO | Admitting: Psychiatry

## 2018-08-16 ENCOUNTER — Ambulatory Visit: Payer: Medicare HMO | Admitting: Psychiatry

## 2018-08-17 ENCOUNTER — Ambulatory Visit: Payer: Medicare HMO | Admitting: Psychiatry

## 2018-08-17 DIAGNOSIS — F902 Attention-deficit hyperactivity disorder, combined type: Secondary | ICD-10-CM | POA: Diagnosis not present

## 2018-08-17 DIAGNOSIS — F3132 Bipolar disorder, current episode depressed, moderate: Secondary | ICD-10-CM

## 2018-08-17 DIAGNOSIS — Z8659 Personal history of other mental and behavioral disorders: Secondary | ICD-10-CM | POA: Diagnosis not present

## 2018-08-17 DIAGNOSIS — F419 Anxiety disorder, unspecified: Secondary | ICD-10-CM

## 2018-08-17 MED ORDER — LITHIUM CARBONATE 300 MG PO TABS: ORAL_TABLET | ORAL | 0 refills | Status: DC

## 2018-08-17 MED ORDER — BUPROPION HCL ER (XL) 300 MG PO TB24: 300.0000 mg | ORAL_TABLET | Freq: Every day | ORAL | 1 refills | Status: DC

## 2018-08-17 MED ORDER — AMPHETAMINE-DEXTROAMPHETAMINE 30 MG PO TABS: 30.0000 mg | ORAL_TABLET | Freq: Every day | ORAL | 0 refills | Status: DC

## 2018-08-17 NOTE — Progress Notes (Signed)
[facility]  Patient ID: Gina Wallace,  MRN: [id number]  PCP: Glendon Axe, MD  Date of Evaluation: 08/17/2018 Time spent:20 minutes  Chief Complaint:   HISTORY/CURRENT STATUS: HPI patient seen 1 month ago.  She continued to have anxiety.  Depression was slightly worse.  She described manic episodes.  We started lithium at her last visit, patient has not started taking lithium. Patient is doing better.  Worries a lot.  Was 2 times a month and up mood.  Individual Medical History/ Review of Systems: Changes? :No   Allergies: Patient has no known allergies.  Current Medications:  Current Outpatient Medications:  .  amphetamine-dextroamphetamine (ADDERALL) 30 MG tablet, Take 1 tablet by mouth daily., Disp: 30 tablet, Rfl: 0 .  buPROPion (WELLBUTRIN XL) 150 MG 24 hr tablet, 1 in am for a week, then 2 in am., Disp: 60 tablet, Rfl: 0 .  CANNABIDIOL PO, Take by mouth daily., Disp: , Rfl:  .  hydrOXYzine (VISTARIL) 25 MG capsule, Take 1 capsule (25 mg total) by mouth 3 (three) times daily as needed (SEVERE ANXIETY SX). (Patient not taking: Reported on 02/02/2018), Disp: 90 capsule, Rfl: 1 .  lisinopril (PRINIVIL,ZESTRIL) 5 MG tablet, Take by mouth. Pt takes 4 tabs each AM, Disp: , Rfl:  .  lithium 300 MG tablet, 1 a night for a week, then 2 hs, Disp: 60 tablet, Rfl: 0 .  OLANZapine (ZYPREXA) 5 MG tablet, Take 1.5 tablets (7.5 mg total) by mouth at bedtime. (Patient not taking: Reported on 07/18/2018), Disp: 45 tablet, Rfl: 1 .  oxyCODONE-acetaminophen (PERCOCET) 7.5-325 MG tablet, Take 2 tablets by mouth every 8 (eight) hours as needed for severe pain., Disp: 30 tablet, Rfl: 0 .  traZODone (DESYREL) 100 MG tablet, TAKE 1-1.5 TABLETS (100-150 MG TOTAL) BY MOUTH AT BEDTIME. (Patient taking differently: Take 100-150 mg by mouth at bedtime. Pt takes 200 mg each night), Disp: 135 tablet, Rfl: 1 Medication Side Effects: none  Family Medical/ Social History: Changes? no  MENTAL  HEALTH EXAM:  There were no vitals taken for this visit.There is no height or weight on file to calculate BMI.  General Appearance: Casual overweight  Eye Contact:  Good  Speech:  Clear and Coherent  Volume:  Normal  Mood:  Depressed  Affect:  Appropriate  Thought Process:  Linear  Orientation:  Full (Time, Place, and Person)  Thought Content: Logical   Suicidal Thoughts:  No  Homicidal Thoughts:  No  Memory:  WNL  Judgement:  Good  Insight:  Good  Psychomotor Activity:  Normal  Concentration:  Concentration: Good  Recall:  Good  Fund of Knowledge: Good  Language: Good  Assets:  Desire for Improvement  ADL's:  Intact  Cognition: WNL  Prognosis:  Good    DIAGNOSES: No diagnosis found.  Receiving Psychotherapy: No    RECOMMENDATIONS: Patient agrees to try lithium.  Should take 300 mg a day at night for a week and then 600 mg a day.  She will continue on her Wellbutrin XL 300 one a day.  She is getting trazodone from her family doctor.  She will continue on her Adderall.  After she has been on lithium 600 mg for a week she will get a thyroid level basic metabolic panel, and lithium level.  Her blood pressure today was 140/81 with a pulse of 71.  We will follow this and follow her lithium level as she is also on lisinopril.  Signs of lithium toxicity were discussed recheck in  1 month   Comer Locket, [place]

## 2018-09-07 DIAGNOSIS — H524 Presbyopia: Secondary | ICD-10-CM | POA: Diagnosis not present

## 2018-09-07 DIAGNOSIS — H353124 Nonexudative age-related macular degeneration, left eye, advanced atrophic with subfoveal involvement: Secondary | ICD-10-CM | POA: Diagnosis not present

## 2018-09-07 DIAGNOSIS — H43813 Vitreous degeneration, bilateral: Secondary | ICD-10-CM | POA: Diagnosis not present

## 2018-09-07 DIAGNOSIS — H35423 Microcystoid degeneration of retina, bilateral: Secondary | ICD-10-CM | POA: Diagnosis not present

## 2018-09-07 DIAGNOSIS — H353113 Nonexudative age-related macular degeneration, right eye, advanced atrophic without subfoveal involvement: Secondary | ICD-10-CM | POA: Diagnosis not present

## 2018-09-12 ENCOUNTER — Other Ambulatory Visit: Payer: Self-pay | Admitting: Psychiatry

## 2018-09-12 ENCOUNTER — Telehealth: Payer: Self-pay | Admitting: Psychiatry

## 2018-09-12 ENCOUNTER — Other Ambulatory Visit: Payer: Self-pay

## 2018-09-12 DIAGNOSIS — Z8659 Personal history of other mental and behavioral disorders: Secondary | ICD-10-CM

## 2018-09-12 MED ORDER — AMPHETAMINE-DEXTROAMPHETAMINE 30 MG PO TABS: 30.0000 mg | ORAL_TABLET | Freq: Every day | ORAL | 0 refills | Status: DC

## 2018-09-12 MED ORDER — BUPROPION HCL ER (XL) 300 MG PO TB24: 300.0000 mg | ORAL_TABLET | Freq: Every day | ORAL | 0 refills | Status: DC

## 2018-09-12 NOTE — Telephone Encounter (Signed)
Pt is out of town with her grandson that is sick. She is out of her prescriptions for Adderall and wellbutrin. She will not be back for about 7 days and would like them called in at [place], [place]. [phone]

## 2018-09-12 NOTE — Telephone Encounter (Signed)
wellbutrin submitted to requested pharmacy, adderall submitted to provider for approval

## 2018-09-12 NOTE — Progress Notes (Unsigned)
Pt out of town needs wellbutrin and adderall submitted to [facility] in [place], see phone message.

## 2018-09-14 ENCOUNTER — Ambulatory Visit: Payer: Medicare HMO | Admitting: Psychiatry

## 2018-09-26 ENCOUNTER — Ambulatory Visit: Payer: Self-pay | Admitting: Nurse Practitioner

## 2018-09-26 NOTE — Progress Notes (Deleted)
Patient's Name: Gina Wallace  MRN: [id number]  Referring Provider: Glendon Axe, MD  DOB: 1955-10-30  PCP: Glendon Axe, MD  DOS: 09/26/2018  Note by: Dionisio David NP  Service setting: Ambulatory outpatient  Specialty: Interventional Pain Management  Location: [facility] (AMB) Pain Management Facility    Patient type: New Patient    Primary Reason(s) for Visit: Initial Patient Evaluation CC: No chief complaint on file.  HPI  [name] is a 63 y.o. year old, female patient, who comes today for an initial evaluation. She has Vitamin D deficiency, unspecified; Screening for osteoporosis; Pain, joint, multiple sites; Myofascial muscle pain; Muscle spasm; Hypertension, essential; Depression, major, single episode, complete remission ([place]); Chronic insomnia; Attention deficit hyperactivity disorder (ADHD); Anxiety; and Other chronic pain on their problem list.. Her primarily concern today is the No chief complaint on file.  Pain Assessment: Location:     Radiating:   Onset:   Duration:   Quality:   Severity:  /10 (subjective, self-reported pain score)  Note: Reported level is compatible with observation.                         When using our objective Pain Scale, levels between 6 and 10/10 are said to belong in an emergency room, as it progressively worsens from a 6/10, described as severely limiting, requiring emergency care not usually available at an outpatient pain management facility. At a 6/10 level, communication becomes difficult and requires great effort. Assistance to reach the emergency department may be required. Facial flushing and profuse sweating along with potentially dangerous increases in heart rate and blood pressure will be evident. Effect on ADL:   Timing:   Modifying factors:   BP:    HR:    Onset and Duration: {Hx; Onset and Duration:210120511} Cause of pain: {Hx; Cause:210120521} Severity: {Pain Severity:210120502} Timing: {Symptoms; Timing:210120501} Aggravating  Factors: {Causes; Aggravating pain factors:210120507} Alleviating Factors: {Causes; Alleviating Factors:210120500} Associated Problems: {Hx; Associated problems:210120515} Quality of Pain: {Hx; Symptom quality or Descriptor:210120531} Previous Examinations or Tests: {Hx; Previous examinations or test:210120529} Previous Treatments: {Hx; Previous Treatment:210120503}  The patient comes into the clinics today for the first time for a chronic pain management evaluation. ***  Today I took the time to provide the patient with information regarding this pain practice. The patient was informed that the practice is divided into two sections: an interventional pain management section, as well as a completely separate and distinct medication management section. I explained that there are procedure days for interventional therapies, and evaluation days for follow-ups and medication management. Because of the amount of documentation required during both, they are kept separated. This means that there is the possibility that *** may be scheduled for a procedure on one day, and medication management the next. I have also informed *** that because of staffing and facility limitations, this practice will no longer take patients for medication management only. To illustrate the reasons for this, I gave the patient the example of surgeons, and how inappropriate it would be to refer a patient to his/her care, just to write for the post-surgical antibiotics on a surgery done by a different surgeon.   Because interventional pain management is part of the board-certified specialty for the doctors, the patient was informed that joining this practice means that they are open to any and all interventional therapies. I made it clear that this does not mean that they will be forced to have any procedures done. What this means is  that I believe interventional therapies to be essential part of the diagnosis and proper management of  chronic pain conditions. Therefore, patients not interested in these interventional alternatives will be better served under the care of a different practitioner.  The patient was also made aware of my Comprehensive Pain Management Safety Guidelines where by joining this practice, they limit all of their nerve blocks and joint injections to those done by our practice, for as long as we are retained to manage their care. Historic Controlled Substance Pharmacotherapy Review  PMP and historical list of controlled substances: Tramadol 50 mg, Dextroamp with amphetamine 30 mg, codon/acetaminophen 7.5/325 mg, hydrocodone/acetaminophen 5/325 mg, Highest opioid analgesic regimen found: *** Most recent opioid analgesic: *** Current opioid analgesics: *** Highest recorded MME/day: *** mg/day MME/day: *** mg/day Medications: The patient did not bring the medication(s) to the appointment, as requested in our "New Patient Package" Pharmacodynamics: Desired effects: Analgesia: The patient reports >50% benefit. Reported improvement in function: The patient reports medication allows her to accomplish basic ADLs. Clinically meaningful improvement in function (CMIF): Sustained CMIF goals met Perceived effectiveness: Described as relatively effective, allowing for increase in activities of daily living (ADL) Undesirable effects: Side-effects or Adverse reactions: None reported Historical Monitoring: The patient  reports no history of drug use. List of all UDS Test(s): No results found for: MDMA, COCAINSCRNUR, PCPSCRNUR, PCPQUANT, CANNABQUANT, THCU, [place] List of all Serum Drug Screening Test(s):  No results found for: AMPHSCRSER, BARBSCRSER, BENZOSCRSER, COCAINSCRSER, PCPSCRSER, PCPQUANT, THCSCRSER, CANNABQUANT, OPIATESCRSER, OXYSCRSER, PROPOXSCRSER Historical Background Evaluation: [place] PDMP: Six (6) year initial data search conducted.             [place] Department of public safety, offender search: Editor, commissioning  Information) Non-contributory Risk Assessment Profile: Aberrant behavior: None observed or detected today Risk factors for fatal opioid overdose: None identified today Fatal overdose hazard ratio (HR): Calculation deferred Non-fatal overdose hazard ratio (HR): Calculation deferred Risk of opioid abuse or dependence: 0.7-3.0% with doses ? 36 MME/day and 6.1-26% with doses ? 120 MME/day. Substance use disorder (SUD) risk level: Pending results of Medical Psychology Evaluation for SUD Opioid risk tool (ORT) (Total Score):    ORT Scoring interpretation table:  Score <3 = Low Risk for SUD  Score between 4-7 = Moderate Risk for SUD  Score >8 = High Risk for Opioid Abuse   PHQ-2 Depression Scale:  Total score:    PHQ-2 Scoring interpretation table: (Score and probability of major depressive disorder)  Score 0 = No depression  Score 1 = 15.4% Probability  Score 2 = 21.1% Probability  Score 3 = 38.4% Probability  Score 4 = 45.5% Probability  Score 5 = 56.4% Probability  Score 6 = 78.6% Probability   PHQ-9 Depression Scale:  Total score:    PHQ-9 Scoring interpretation table:  Score 0-4 = No depression  Score 5-9 = Mild depression  Score 10-14 = Moderate depression  Score 15-19 = Moderately severe depression  Score 20-27 = Severe depression (2.4 times higher risk of SUD and 2.89 times higher risk of overuse)   Pharmacologic Plan: Pending ordered tests and/or consults  Meds  The patient has a current medication list which includes the following prescription(s): amphetamine-dextroamphetamine, amphetamine-dextroamphetamine, bupropion, cannabidiol, hydroxyzine, lisinopril, lithium, olanzapine, oxycodone-acetaminophen, and trazodone.  Current Outpatient Medications on File Prior to Visit  Medication Sig  . amphetamine-dextroamphetamine (ADDERALL) 30 MG tablet Take 1 tablet by mouth daily.  Marland Kitchen amphetamine-dextroamphetamine (ADDERALL) 30 MG tablet Take 1 tablet by mouth daily.  Marland Kitchen buPROPion  [facility]  XL) 300 MG 24 hr tablet Take 1 tablet (300 mg total) by mouth daily.  Marland Kitchen CANNABIDIOL PO Take by mouth daily.  . hydrOXYzine (VISTARIL) 25 MG capsule Take 1 capsule (25 mg total) by mouth 3 (three) times daily as needed (SEVERE ANXIETY SX). (Patient not taking: Reported on 02/02/2018)  . lisinopril (PRINIVIL,ZESTRIL) 5 MG tablet Take by mouth. Pt takes 4 tabs each AM  . lithium 300 MG tablet 1 a night for a week, then 2 hs  . OLANZapine (ZYPREXA) 5 MG tablet Take 1.5 tablets (7.5 mg total) by mouth at bedtime. (Patient not taking: Reported on 07/18/2018)  . oxyCODONE-acetaminophen (PERCOCET) 7.5-325 MG tablet Take 2 tablets by mouth every 8 (eight) hours as needed for severe pain.  . traZODone (DESYREL) 100 MG tablet TAKE 1-1.5 TABLETS (100-150 MG TOTAL) BY MOUTH AT BEDTIME. (Patient taking differently: Take 100-150 mg by mouth at bedtime. Pt takes 200 mg each night)   No current facility-administered medications on file prior to visit.    Imaging Review  Note: Available results from prior imaging studies were reviewed.        ROS  Cardiovascular History: {Hx; Cardiovascular History:210120525} Pulmonary or Respiratory History: {Hx; Pumonary and/or Respiratory History:210120523} Neurological History: {Hx; Neurological:210120504} Review of Past Neurological Studies: No results found for this or any previous visit. Psychological-Psychiatric History: {Hx; Psychological-Psychiatric History:210120512} Gastrointestinal History: {Hx; Gastrointestinal:210120527} Genitourinary History: {Hx; Genitourinary:210120506} Hematological History: {Hx; Hematological:210120510} Endocrine History: {Hx; Endocrine history:210120509} Rheumatologic History: {Hx; Rheumatological:210120530} Musculoskeletal History: {Hx; Musculoskeletal:210120528} Work History: {Hx; Work history:210120514}  Allergies  [name] has No Known Allergies.  Laboratory Chemistry  Inflammation Markers No results found for:  CRP, ESRSEDRATE (CRP: Acute Phase) (ESR: Chronic Phase) Renal Function Markers No results found for: BUN, CREATININE, GFRAA, GFRNONAA Hepatic Function Markers No results found for: AST, ALT, ALBUMIN, ALKPHOS, HCVAB Electrolytes No results found for: NA, K, CL, CALCIUM, MG Neuropathy Markers No results found for: [id number] Bone Pathology Markers No results found for: Hendricks Milo, VD125OH2TOT, [id number], [id number], 25OHVITD1, 25OHVITD2, 25OHVITD3, CALCIUM, TESTOFREE, TESTOSTERONE Coagulation Parameters No results found for: INR, LABPROT, APTT, PLT Cardiovascular Markers No results found for: BNP, HGB, HCT Note: Lab results reviewed.  [place]  Drug: [name]  reports no history of drug use. Alcohol:  reports previous alcohol use. Tobacco:  reports that she quit smoking about 10 years ago. Her smoking use included cigarettes. She has never used smokeless tobacco. Medical:  has a past medical history of ADHD (attention deficit hyperactivity disorder), Anxiety, Bipolar disorder ([place]), Complication of anesthesia, Depression, Fibromyalgia, Hypertension, and Wears dentures. Family: family history includes Alcohol abuse in her maternal uncle; Anxiety disorder in her mother; Breast cancer in her maternal aunt and maternal grandmother; Breast cancer (age of onset: 11) in her mother; Depression in her mother.  Past Surgical History:  Procedure Laterality Date  . ABDOMINAL HYSTERECTOMY    . ABLATION    . ANTERIOR CRUCIATE LIGAMENT REPAIR Right   . BREAST BIOPSY Bilateral    All negative  . CESAREAN SECTION    . HALLUX VALGUS LAPIDUS Left 02/02/2018   Procedure: HALLUX VALGUS LAPIDUS;  Surgeon: Samara Deist, DPM;  Location: [place];  Service: Podiatry;  Laterality: Left;  general w popliteal block lapiplasty set  . HAMMER TOE SURGERY Left 02/02/2018   Procedure: HAMMER TOE CORRECTION - T1 & T2;  Surgeon: Samara Deist, DPM;  Location: [place];  Service: Podiatry;   Laterality: Left;  . THYROIDECTOMY Right    left lobe remains  .  TONSILLECTOMY AND ADENOIDECTOMY    . WEIL OSTEOTOMY Left 02/02/2018   Procedure: WEIL OSTEOTOMY X 2, REVERSE [place], AND Barbie Banner;  Surgeon: Samara Deist, DPM;  Location: [place];  Service: Podiatry;  Laterality: Left;   Active Ambulatory Problems    Diagnosis Date Noted  . Vitamin D deficiency, unspecified 08/15/2017  . Screening for osteoporosis 09/16/2017  . Pain, joint, multiple sites 08/15/2017  . Myofascial muscle pain 09/16/2017  . Muscle spasm 08/15/2017  . Hypertension, essential 08/13/2017  . Depression, major, single episode, complete remission ([place]) 08/15/2017  . Chronic insomnia 08/13/2017  . Attention deficit hyperactivity disorder (ADHD) 08/15/2017  . Anxiety 08/15/2017  . Other chronic pain 07/23/2018   Resolved Ambulatory Problems    Diagnosis Date Noted  . No Resolved Ambulatory Problems   Past Medical History:  Diagnosis Date  . ADHD (attention deficit hyperactivity disorder)   . Bipolar disorder ([place])   . Complication of anesthesia   . Depression   . Fibromyalgia   . Hypertension   . Wears dentures    Constitutional Exam  General appearance: Well nourished, well developed, and well hydrated. In no apparent acute distress There were no vitals filed for this visit. BMI Assessment: Estimated body mass index is 32.69 kg/m as calculated from the following:   Height as of 01/25/18: 5' 8"  (1.727 m).   Weight as of 01/25/18: 215 lb (97.5 kg).  BMI interpretation table: BMI level Category Range association with higher incidence of chronic pain  <18 kg/m2 Underweight   18.5-24.9 kg/m2 Ideal body weight   25-29.9 kg/m2 Overweight Increased incidence by 20%  30-34.9 kg/m2 Obese (Class I) Increased incidence by 68%  35-39.9 kg/m2 Severe obesity (Class II) Increased incidence by 136%  >40 kg/m2 Extreme obesity (Class III) Increased incidence by 254%   BMI Readings from Last 4 Encounters:   01/25/18 32.69 kg/m   Wt Readings from Last 4 Encounters:  01/25/18 215 lb (97.5 kg)  Psych/Mental status: Alert, oriented x 3 (person, place, & time)       Eyes: PERLA Respiratory: No evidence of acute respiratory distress  Cervical Spine Exam  Inspection: No masses, redness, or swelling Alignment: Symmetrical Functional ROM: Unrestricted ROM      Stability: No instability detected Muscle strength & Tone: Functionally intact Sensory: Unimpaired Palpation: No palpable anomalies              Upper Extremity (UE) Exam    Side: Right upper extremity  Side: Left upper extremity  Inspection: No masses, redness, swelling, or asymmetry. No contractures  Inspection: No masses, redness, swelling, or asymmetry. No contractures  Functional ROM: Unrestricted ROM          Functional ROM: Unrestricted ROM          Muscle strength & Tone: Functionally intact  Muscle strength & Tone: Functionally intact  Sensory: Unimpaired  Sensory: Unimpaired  Palpation: No palpable anomalies              Palpation: No palpable anomalies              Specialized Test(s): Deferred         Specialized Test(s): Deferred          Thoracic Spine Exam  Inspection: No masses, redness, or swelling Alignment: Symmetrical Functional ROM: Unrestricted ROM Stability: No instability detected Sensory: Unimpaired Muscle strength & Tone: No palpable anomalies  Lumbar Spine Exam  Inspection: No masses, redness, or swelling Alignment: Symmetrical Functional ROM: Unrestricted ROM  Stability: No instability detected Muscle strength & Tone: Functionally intact Sensory: Unimpaired Palpation: No palpable anomalies       Provocative Tests: Lumbar Hyperextension and rotation test: evaluation deferred today       Patrick's Maneuver: evaluation deferred today                    Gait & Posture Assessment  Ambulation: Unassisted Gait: Relatively normal for age and body habitus Posture: WNL   Lower Extremity Exam     Side: Right lower extremity  Side: Left lower extremity  Inspection: No masses, redness, swelling, or asymmetry. No contractures  Inspection: No masses, redness, swelling, or asymmetry. No contractures  Functional ROM: Unrestricted ROM          Functional ROM: Unrestricted ROM          Muscle strength & Tone: Functionally intact  Muscle strength & Tone: Functionally intact  Sensory: Unimpaired  Sensory: Unimpaired  Palpation: No palpable anomalies  Palpation: No palpable anomalies   Assessment  Primary Diagnosis & Pertinent Problem List: There were no encounter diagnoses.  Visit Diagnosis: No diagnosis found. Plan of Care  Initial treatment plan:  Please be advised that as per protocol, today's visit has been an evaluation only. We have not taken over the patient's controlled substance management.  Problem-specific plan: No problem-specific Assessment & Plan notes found for this encounter.  Ordered Lab-work, Procedure(s), Referral(s), & Consult(s): No orders of the defined types were placed in this encounter.  Pharmacotherapy: Medications ordered:  No orders of the defined types were placed in this encounter.  Medications administered during this visit: Fairy Ashlock had no medications administered during this visit.   Pharmacotherapy under consideration:  Opioid Analgesics: The patient was informed that there is no guarantee that she would be a candidate for opioid analgesics. The decision will be made following CDC guidelines. This decision will be based on the results of diagnostic studies, as well as Ms. Mitchell's risk profile.  Membrane stabilizer: To be determined at a later time Muscle relaxant: To be determined at a later time NSAID: To be determined at a later time Other analgesic(s): To be determined at a later time   Interventional therapies under consideration: [name] was informed that there is no guarantee that she would be a candidate for interventional  therapies. The decision will be based on the results of diagnostic studies, as well as Ms. Mitchell's risk profile.  Possible procedure(s): ***   Provider-requested follow-up: No follow-ups on file.  Future Appointments  Date Time Provider [place]  09/26/2018  2:00 PM Vevelyn Francois, NP [facility] None    Primary Care Physician: Glendon Axe, MD Location: [facility] Outpatient Pain Management Facility Note by:  Date: 09/26/2018; Time: 1:10 PM  Pain Score Disclaimer: We use the NRS-11 scale. This is a self-reported, subjective measurement of pain severity with only modest accuracy. It is used primarily to identify changes within a particular patient. It must be understood that outpatient pain scales are significantly less accurate that those used for research, where they can be applied under ideal controlled circumstances with minimal exposure to variables. In reality, the score is likely to be a combination of pain intensity and pain affect, where pain affect describes the degree of emotional arousal or changes in action readiness caused by the sensory experience of pain. Factors such as social and work situation, setting, emotional state, anxiety levels, expectation, and prior pain experience may influence pain perception and show large inter-individual  differences that may also be affected by time variables.  Patient instructions provided during this appointment: There are no Patient Instructions on file for this visit.

## 2018-10-05 ENCOUNTER — Ambulatory Visit: Payer: Self-pay | Admitting: Nurse Practitioner

## 2018-10-10 DIAGNOSIS — R509 Fever, unspecified: Secondary | ICD-10-CM | POA: Diagnosis not present

## 2018-10-10 DIAGNOSIS — R52 Pain, unspecified: Secondary | ICD-10-CM | POA: Diagnosis not present

## 2018-10-10 DIAGNOSIS — R51 Headache: Secondary | ICD-10-CM | POA: Diagnosis not present

## 2018-10-10 DIAGNOSIS — R42 Dizziness and giddiness: Secondary | ICD-10-CM | POA: Diagnosis not present

## 2018-10-10 DIAGNOSIS — R0602 Shortness of breath: Secondary | ICD-10-CM | POA: Diagnosis not present

## 2018-10-10 DIAGNOSIS — G8929 Other chronic pain: Secondary | ICD-10-CM | POA: Diagnosis not present

## 2018-10-10 DIAGNOSIS — R05 Cough: Secondary | ICD-10-CM | POA: Diagnosis not present

## 2018-10-10 DIAGNOSIS — F325 Major depressive disorder, single episode, in full remission: Secondary | ICD-10-CM | POA: Diagnosis not present

## 2018-10-10 DIAGNOSIS — I10 Essential (primary) hypertension: Secondary | ICD-10-CM | POA: Diagnosis not present

## 2018-10-12 ENCOUNTER — Telehealth: Payer: Self-pay | Admitting: Psychiatry

## 2018-10-12 NOTE — Telephone Encounter (Signed)
Last filled 03/09 in [place]

## 2018-10-12 NOTE — Telephone Encounter (Signed)
Patient called and said that she needs a refill of adderrall 30 mg 2 x day to [facility]

## 2018-10-13 ENCOUNTER — Other Ambulatory Visit: Payer: Self-pay

## 2018-10-13 ENCOUNTER — Other Ambulatory Visit: Payer: Self-pay | Admitting: Psychiatry

## 2018-10-13 DIAGNOSIS — Z8659 Personal history of other mental and behavioral disorders: Secondary | ICD-10-CM

## 2018-10-13 MED ORDER — AMPHETAMINE-DEXTROAMPHETAMINE 30 MG PO TABS: 30.0000 mg | ORAL_TABLET | Freq: Every day | ORAL | 0 refills | Status: DC

## 2018-10-13 MED ORDER — AMPHETAMINE-DEXTROAMPHETAMINE 30 MG PO TABS
30.0000 mg | ORAL_TABLET | Freq: Every day | ORAL | 0 refills | Status: DC
Start: 2018-10-13 — End: 2018-12-30

## 2018-10-13 NOTE — Telephone Encounter (Signed)
Submitted to provider for approval.

## 2018-10-13 NOTE — Progress Notes (Signed)
Ordered Adderall again because it was set to print instead of E prescribed that was corrected.

## 2018-11-10 DIAGNOSIS — H353134 Nonexudative age-related macular degeneration, bilateral, advanced atrophic with subfoveal involvement: Secondary | ICD-10-CM | POA: Diagnosis not present

## 2018-11-10 DIAGNOSIS — H35342 Macular cyst, hole, or pseudohole, left eye: Secondary | ICD-10-CM | POA: Diagnosis not present

## 2018-11-10 DIAGNOSIS — H43813 Vitreous degeneration, bilateral: Secondary | ICD-10-CM | POA: Diagnosis not present

## 2018-11-10 DIAGNOSIS — H35372 Puckering of macula, left eye: Secondary | ICD-10-CM | POA: Diagnosis not present

## 2018-11-14 ENCOUNTER — Telehealth: Payer: Self-pay | Admitting: Psychiatry

## 2018-11-14 ENCOUNTER — Other Ambulatory Visit: Payer: Self-pay

## 2018-11-14 MED ORDER — TRAZODONE HCL 150 MG PO TABS: 150.0000 mg | ORAL_TABLET | Freq: Every day | ORAL | 0 refills | Status: DC

## 2018-11-14 MED ORDER — AMPHETAMINE-DEXTROAMPHETAMINE 30 MG PO TABS: 30.0000 mg | ORAL_TABLET | Freq: Every day | ORAL | 0 refills | Status: DC

## 2018-11-14 NOTE — Telephone Encounter (Signed)
Pended for provider

## 2018-11-14 NOTE — Telephone Encounter (Signed)
Pt requesting refills on Trazodone 150mg . Stated not to fill tab at 300mg  because of the difference in cost. She also need the Adderall refilled.Please fill at the [facility]. [address]... She made appt. With JC.

## 2018-11-30 ENCOUNTER — Encounter: Payer: Self-pay | Admitting: Psychiatry

## 2018-11-30 ENCOUNTER — Other Ambulatory Visit: Payer: Self-pay

## 2018-11-30 ENCOUNTER — Ambulatory Visit: Payer: Medicare HMO | Admitting: Psychiatry

## 2018-11-30 DIAGNOSIS — F3160 Bipolar disorder, current episode mixed, unspecified: Secondary | ICD-10-CM

## 2018-11-30 DIAGNOSIS — F411 Generalized anxiety disorder: Secondary | ICD-10-CM

## 2018-11-30 DIAGNOSIS — G4700 Insomnia, unspecified: Secondary | ICD-10-CM

## 2018-11-30 DIAGNOSIS — F901 Attention-deficit hyperactivity disorder, predominantly hyperactive type: Secondary | ICD-10-CM

## 2018-11-30 MED ORDER — BUPROPION HCL ER (XL) 300 MG PO TB24: 300.0000 mg | ORAL_TABLET | Freq: Every day | ORAL | 0 refills | Status: DC

## 2018-11-30 MED ORDER — OXCARBAZEPINE 300 MG PO TABS: ORAL_TABLET | ORAL | 1 refills | Status: DC

## 2018-11-30 MED ORDER — AMPHETAMINE-DEXTROAMPHETAMINE 30 MG PO TABS: 30.0000 mg | ORAL_TABLET | Freq: Every day | ORAL | 0 refills | Status: DC

## 2018-11-30 MED ORDER — TRAZODONE HCL 150 MG PO TABS: ORAL_TABLET | ORAL | 1 refills | Status: DC

## 2018-11-30 NOTE — Progress Notes (Signed)
Gina Wallace [id number] Dec 06, 1955 63 y.o.  Virtual Visit via Telephone Note  I connected with pt on 11/30/18 at  9:30 AM EDT by telephone and verified that I am speaking with the correct person using two identifiers.   I discussed the limitations, risks, security and privacy concerns of performing an evaluation and management service by telephone and the availability of in person appointments. I also discussed with the patient that there may be a patient responsible charge related to this service. The patient expressed understanding and agreed to proceed.   I discussed the assessment and treatment plan with the patient. The patient was provided an opportunity to ask questions and all were answered. The patient agreed with the plan and demonstrated an understanding of the instructions.   The patient was advised to call back or seek an in-person evaluation if the symptoms worsen or if the condition fails to improve as anticipated.  I provided 30 minutes of non-face-to-face time during this encounter.  The patient was located at home.  The provider was located at home.   Thayer Headings, PMHNP   [name] is a 63 y.o. (DOB April 01, 1956) female.  Chief Complaint:  Chief Complaint  Patient presents with  . Anxiety  . Depression  . ADD    HPI [name] presents for follow-up of mood, anxiety, and ADD.  Patient previously seen by Comer Locket, PA.  Patient was seen for initial evaluation with Delorise Royals, PA on 07/18/18.  She reports that she has been taking Adderall 30 mg BID and "it changed my life." She reports that [facility], [place] wanted her to continue 30 mg qd instead of BID. ( Controlled substance database indicates pt was received Adderall 30 mg #30 per month from previous provider). She reports that she would prefer 30 mg BID and 30 mg once daily and that 30 mg is not very effective for her. Reports that she will take 30 mg BID on days  that she needs to be productive and not taking it on other days. She reports that her concentration is poor with lower doses of Adderall.   Moved from [place] to be with fiance. Reports that she has been trying to get her records from previous psychiatrist in [place] and that this has been complicated by legal issues involving past psychiatrist.   She reports depression with 20 yo grandson having Stage IV cancer. She reports having difficulty getting off the couch. She reports that she has had low energy and motivation. She reports adequate sleep with Trazodone 300-450 mg po QHS. Reports that she has always been "nocturnal" and Trazodone has been most effective for insomnia. She reports that her appetite has been increased and recently has gained 10 lbs. Reports emotional eating. Denies SI.  She reports anxiety and "over-thinking everything." Reports worry about grandson and husband's hours and pay being cut with pandemic. Denies panic attacks.   Reports that she had some impulsive behaviors in her younger years and this has been infrequent. Reports some increase in spending. She reports racing thoughts at times. Denies any periods of excessive energy or increased goal-directed activity.   Recently got married, bought a house, and grandchild has cancer.   Dx'd with Fibromyalgia in 2009. 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  Reports dx'd with Bipolar years ago. "The highs and lows are no longer as extreme, but I still have highs and lows."  Past Psychiatric Medication Trials: Reports h/o  needing higher doses of medications since childhood Lithium- "I just don't want to take Lithium." Lamictal- denies adverse effects. May have been helpful. Adderall  Wellbutrin XL- Has been effective. Cymbalta- Took x 3 months and had "severe reaction" Zoloft Effexor- Seemed to be effective Klonopin- Reports taking prn for anxiety a few times a week.  Trazodone- Has taken 150 mg in the  past and recently taking 300 mg. Effective.  Ambien Vistaril- Ineffective Zyprexa- Reports that she took for only a week and stopped due to severe nightmares/lucid dreams  Review of Systems:  Review of Systems  Musculoskeletal: Positive for arthralgias and myalgias. Negative for gait problem.  Neurological: Negative for tremors.  Psychiatric/Behavioral:       Please refer to HPI   VS on 10/10/18 at medical visit was 128/76 and Pulse 84. She reports that BP has been WNL at home and not above 140/85.    Medications: I have reviewed the patient's current medications.  Current Outpatient Medications  Medication Sig Dispense Refill  . amphetamine-dextroamphetamine (ADDERALL) 30 MG tablet Take 1 tablet by mouth daily. 30 tablet 0  . [START ON 12/12/2018] amphetamine-dextroamphetamine (ADDERALL) 30 MG tablet Take 1 tablet by mouth daily for 30 days. 30 tablet 0  . Apoaequorin (PREVAGEN PO) Take by mouth.    Marland Kitchen buPROPion (WELLBUTRIN XL) 300 MG 24 hr tablet Take 1 tablet (300 mg total) by mouth daily. 90 tablet 0  . Multiple Vitamins-Minerals (VISION PLUS PO) Take by mouth.    Marland Kitchen tiZANidine (ZANAFLEX) 4 MG capsule Take 4 mg by mouth 3 (three) times daily as needed for muscle spasms.    . traZODone (DESYREL) 150 MG tablet Take 1-2 tabs po QHS 90 tablet 1  . CANNABIDIOL PO Take by mouth daily.    Marland Kitchen lisinopril (PRINIVIL,ZESTRIL) 5 MG tablet Take by mouth. Pt takes 4 tabs each AM    . Oxcarbazepine (TRILEPTAL) 300 MG tablet Take 1/2 tab po QHS x 3-5 days, then increase to 1 tab po QHS 30 tablet 1   No current facility-administered medications for this visit.     Medication Side Effects: None  Allergies: No Known Allergies  Past Medical History:  Diagnosis Date  . ADHD (attention deficit hyperactivity disorder)   . Anxiety   . Bipolar disorder ([place])   . Complication of anesthesia    pt requires larger doses of meds for effect  . Depression   . Fibromyalgia   . Hypertension   . Macular  degeneration   . Wears dentures    full upper and lower    Family History  Problem Relation Age of Onset  . Anxiety disorder Mother   . Depression Mother   . Breast cancer Mother 46  . Alcohol abuse Maternal Uncle   . Breast cancer Maternal Aunt   . Breast cancer Maternal Grandmother     Social History   Socioeconomic History  . Marital status: Married    Spouse name: sam  . Number of children: 2  . Years of education: Not on file  . Highest education level: Associate degree: occupational, Hotel manager, or vocational program  Occupational History  . Not on file  Social Needs  . Financial resource strain: Not hard at all  . Food insecurity:    Worry: Never true    Inability: Never true  . Transportation needs:    Medical: No    Non-medical: No  Tobacco Use  . Smoking status: Former Smoker    Types: Cigarettes  Last attempt to quit: 12/14/2007    Years since quitting: 10.9  . Smokeless tobacco: Never Used  Substance and Sexual Activity  . Alcohol use: Not Currently    Comment: may have drink on Holidays  . Drug use: Never  . Sexual activity: Yes    Partners: Male    Birth control/protection: None  Lifestyle  . Physical activity:    Days per week: 0 days    Minutes per session: 0 min  . Stress: Very much  Relationships  . Social connections:    Talks on phone: Not on file    Gets together: Not on file    Attends religious service: Never    Active member of club or organization: No    Attends meetings of clubs or organizations: Never    Relationship status: Married  . Intimate partner violence:    Fear of current or ex partner: No    Emotionally abused: No    Physically abused: No    Forced sexual activity: No  Other Topics Concern  . Not on file  Social History Narrative  . Not on file    Past Medical History, Surgical history, Social history, and Family history were reviewed and updated as appropriate.   Please see review of systems for further  details on the patient's review from today.   Objective:   Physical Exam:  There were no vitals taken for this visit.  Physical Exam Neurological:     Mental Status: She is alert and oriented to person, place, and time.     Cranial Nerves: No dysarthria.  Psychiatric:        Attention and Perception: She is inattentive.        Mood and Affect: Mood is anxious.        Speech: Speech is rapid and pressured.        Behavior: Behavior is cooperative.        Thought Content: Thought content normal. Thought content is not paranoid or delusional. Thought content does not include homicidal or suicidal ideation. Thought content does not include homicidal or suicidal plan.        Cognition and Memory: Cognition and memory normal.        Judgment: Judgment normal.     Lab Review:  No results found for: NA, K, CL, CO2, GLUCOSE, BUN, CREATININE, CALCIUM, PROT, ALBUMIN, AST, ALT, ALKPHOS, BILITOT, GFRNONAA, GFRAA  No results found for: WBC, RBC, HGB, HCT, PLT, MCV, MCH, MCHC, RDW, LYMPHSABS, MONOABS, EOSABS, BASOSABS  No results found for: POCLITH, LITHIUM   No results found for: PHENYTOIN, PHENOBARB, VALPROATE, CBMZ   .res Assessment: Plan:    Agree with plan to continue to try to obtain records from previous psychiatric provider. Recommend not increasing Adderall at this time since her anxiety and insomnia are not well controlled and higher doses of Adderall could exacerbate anxiety, insomnia, and racing thoughts. Discussed potential benefits, risk, and side effects of Trileptal for mood stabilization, anxiety, and insomnia. Will start Trileptal 300 mg 1/2 tablet p.o. nightly x3 to 5 days, then increase to 1 tab p.o. nightly. Continue Adderall 30 mg once daily for attention deficit. Continue Wellbutrin XL 300 mg daily for depression. Continue trazodone 150 to 450 mg p.o. nightly for insomnia. Patient to follow-up in 4 weeks or sooner if clinically indicated. Patient advised to contact  office with any questions, adverse effects, or acute worsening in signs and symptoms.   Attention deficit hyperactivity disorder (ADHD), predominantly hyperactive type -  Plan: amphetamine-dextroamphetamine (ADDERALL) 30 MG tablet  History of ADHD  Bipolar affective disorder, current episode mixed, current episode severity unspecified ([place]) - Plan: Oxcarbazepine (TRILEPTAL) 300 MG tablet, buPROPion (WELLBUTRIN XL) 300 MG 24 hr tablet  Generalized anxiety disorder - Plan: Oxcarbazepine (TRILEPTAL) 300 MG tablet  Insomnia, unspecified type - Plan: traZODone (DESYREL) 150 MG tablet  Please see After Visit Summary for patient specific instructions.  Future Appointments  Date Time Provider [place]  12/29/2018  1:15 PM Thayer Headings, PMHNP [facility] None    No orders of the defined types were placed in this encounter.     -------------------------------

## 2018-12-29 ENCOUNTER — Telehealth: Payer: Self-pay | Admitting: Psychiatry

## 2018-12-29 ENCOUNTER — Ambulatory Visit (INDEPENDENT_AMBULATORY_CARE_PROVIDER_SITE_OTHER): Payer: Medicare HMO | Admitting: Psychiatry

## 2018-12-29 ENCOUNTER — Other Ambulatory Visit: Payer: Self-pay

## 2018-12-29 DIAGNOSIS — Z8659 Personal history of other mental and behavioral disorders: Secondary | ICD-10-CM

## 2018-12-29 DIAGNOSIS — F411 Generalized anxiety disorder: Secondary | ICD-10-CM

## 2018-12-29 DIAGNOSIS — F901 Attention-deficit hyperactivity disorder, predominantly hyperactive type: Secondary | ICD-10-CM | POA: Diagnosis not present

## 2018-12-29 DIAGNOSIS — F3160 Bipolar disorder, current episode mixed, unspecified: Secondary | ICD-10-CM

## 2018-12-29 MED ORDER — BUPROPION HCL ER (XL) 300 MG PO TB24: 300.0000 mg | ORAL_TABLET | Freq: Every day | ORAL | 0 refills | Status: DC

## 2018-12-29 MED ORDER — OXCARBAZEPINE 300 MG PO TABS: ORAL_TABLET | ORAL | 1 refills | Status: DC

## 2018-12-29 NOTE — Progress Notes (Signed)
Gina Wallace [id number] 1955/09/17 63 y.o.  Virtual Visit via Telephone Note  I connected with pt on 12/29/18 at  1:15 PM EDT by telephone and verified that I am speaking with the correct person using two identifiers.   I discussed the limitations, risks, security and privacy concerns of performing an evaluation and management service by telephone and the availability of in person appointments. I also discussed with the patient that there may be a patient responsible charge related to this service. The patient expressed understanding and agreed to proceed.   I discussed the assessment and treatment plan with the patient. The patient was provided an opportunity to ask questions and all were answered. The patient agreed with the plan and demonstrated an understanding of the instructions.   The patient was advised to call back or seek an in-person evaluation if the symptoms worsen or if the condition fails to improve as anticipated.  I provided 30 minutes of non-face-to-face time during this encounter.  The patient was located at home.  The provider was located at [place].   Thayer Headings, PMHNP   [name] is a 63 y.o. (DOB 12/09/1955) female.  Chief Complaint:  Chief Complaint  Patient presents with  . Anxiety  . Depression  . ADD    HPI Gina Wallace presents for follow-up of Mood, ADD, and insomnia. She reports that Trileptal has been helpful for her insomnia. Reports that she is trying to keep a consistent sleep/wake cycle. She reports that she wakes up feeling rested most days. Reports that she feels as if she is dreaming throughout the night. Reports sleeping 8.5-9 hours a night. Reports that her motivation has improved slightly. She reports that she has been doing more around the house to include doing laundry and preparing meals. Reports energy is low and reports that she has to make herself to do things. She reports that  she tends to emotional eat. Reports that she would like to lose weight. Denies SI.  She reports that she continues to have anxiety. She reports that anxiety seems to be situational and thinks that she would benefit from seeing a therapist. Reports that she has anxiety with grandson having cancer. She reports that there are some communication issues in her marriage that is also causing her stress. Reports that early onset macular degeneration and progression in visual impairment has also been causing her distress. Reports some mild irritabiility. She reports that her concentration is adequate when she takes Adderall.   She reports that her depression is "about the same or maybe a little worse." She reports that she has had more situational stressors. She reports that she has withdrawn slightly from others. Denies any risky or impulsive behaviors. Denies excessive spending. Denies racing thoughts.   Reports that Adderall tends to be effective for 3-5 hours.   Past Psychiatric Medication Trials: Reports h/o needing higher doses of medications since childhood Lithium- "I just don't want to take Lithium." Lamictal- denies adverse effects. May have been helpful. Trileptal Adderall  Wellbutrin XL- Has been effective. Cymbalta- Took x 3 months and had "severe reaction" Zoloft Effexor- Seemed to be effective Klonopin- Reports taking prn for anxiety a few times a week.  Trazodone- Has taken 150 mg in the past and recently taking 300 mg. Effective.  Ambien Vistaril- Ineffective Zyprexa- Reports that she took for only a week and stopped due to severe nightmares/lucid dreams  Review of Systems:  Review of Systems  Cardiovascular: Negative  for palpitations.  Musculoskeletal: Negative for gait problem.  Neurological: Negative for tremors.  Psychiatric/Behavioral:       Please refer to HPI     Reports that she has upcoming with PCP.  Medications: I have reviewed the patient's current  medications.  Current Outpatient Medications  Medication Sig Dispense Refill  . amphetamine-dextroamphetamine (ADDERALL) 30 MG tablet Take 1 tablet by mouth daily. 30 tablet 0  . amphetamine-dextroamphetamine (ADDERALL) 30 MG tablet Take 1 tablet by mouth daily for 30 days. 30 tablet 0  . Apoaequorin (PREVAGEN PO) Take by mouth.    Marland Kitchen buPROPion (WELLBUTRIN XL) 300 MG 24 hr tablet Take 1 tablet (300 mg total) by mouth daily. 90 tablet 0  . CANNABIDIOL PO Take by mouth daily.    . Multiple Vitamins-Minerals (VISION PLUS PO) Take by mouth.    . Oxcarbazepine (TRILEPTAL) 300 MG tablet Take 1.5 tabs po QHS x 5 days, then increase to 2 tab po QHS as tolerated 30 tablet 1  . tiZANidine (ZANAFLEX) 4 MG capsule Take 4 mg by mouth 3 (three) times daily as needed for muscle spasms.    . traZODone (DESYREL) 150 MG tablet Take 1-2 tabs po QHS 90 tablet 1  . lisinopril (PRINIVIL,ZESTRIL) 5 MG tablet Take by mouth. Pt takes 4 tabs each AM     No current facility-administered medications for this visit.     Medication Side Effects: None  Allergies: No Known Allergies  Past Medical History:  Diagnosis Date  . ADHD (attention deficit hyperactivity disorder)   . Anxiety   . Bipolar disorder ([place])   . Complication of anesthesia    pt requires larger doses of meds for effect  . Depression   . Fibromyalgia   . Hypertension   . Macular degeneration   . Wears dentures    full upper and lower    Family History  Problem Relation Age of Onset  . Anxiety disorder Mother   . Depression Mother   . Breast cancer Mother 80  . Alcohol abuse Maternal Uncle   . Breast cancer Maternal Aunt   . Breast cancer Maternal Grandmother     Social History   Socioeconomic History  . Marital status: Married    Spouse name: sam  . Number of children: 2  . Years of education: Not on file  . Highest education level: Associate degree: occupational, Hotel manager, or vocational program  Occupational History  . Not on  file  Social Needs  . Financial resource strain: Not hard at all  . Food insecurity    Worry: Never true    Inability: Never true  . Transportation needs    Medical: No    Non-medical: No  Tobacco Use  . Smoking status: Former Smoker    Types: Cigarettes    Quit date: 12/14/2007    Years since quitting: 11.0  . Smokeless tobacco: Never Used  Substance and Sexual Activity  . Alcohol use: Not Currently    Comment: may have drink on Holidays  . Drug use: Never  . Sexual activity: Yes    Partners: Male    Birth control/protection: None  Lifestyle  . Physical activity    Days per week: 0 days    Minutes per session: 0 min  . Stress: Very much  Relationships  . Social Herbalist on phone: Not on file    Gets together: Not on file    Attends religious service: Never    Active member  of club or organization: No    Attends meetings of clubs or organizations: Never    Relationship status: Married  . Intimate partner violence    Fear of current or ex partner: No    Emotionally abused: No    Physically abused: No    Forced sexual activity: No  Other Topics Concern  . Not on file  Social History Narrative  . Not on file    Past Medical History, Surgical history, Social history, and Family history were reviewed and updated as appropriate.   Please see review of systems for further details on the patient's review from today.   Objective:   Physical Exam:  There were no vitals taken for this visit.  Physical Exam Neurological:     Mental Status: She is alert and oriented to person, place, and time.     Cranial Nerves: No dysarthria.  Psychiatric:        Attention and Perception: Attention normal.        Mood and Affect: Mood is anxious and depressed.        Speech: Speech normal.        Behavior: Behavior is cooperative.        Thought Content: Thought content normal. Thought content is not paranoid or delusional. Thought content does not include homicidal or  suicidal ideation. Thought content does not include homicidal or suicidal plan.        Cognition and Memory: Cognition and memory normal.        Judgment: Judgment normal.    Patient reports that her blood pressure is typically 120/80 or less.  Lab Review:  No results found for: NA, K, CL, CO2, GLUCOSE, BUN, CREATININE, CALCIUM, PROT, ALBUMIN, AST, ALT, ALKPHOS, BILITOT, GFRNONAA, GFRAA  No results found for: WBC, RBC, HGB, HCT, PLT, MCV, MCH, MCHC, RDW, LYMPHSABS, MONOABS, EOSABS, BASOSABS  No results found for: POCLITH, LITHIUM   No results found for: PHENYTOIN, PHENOBARB, VALPROATE, CBMZ   .res Assessment: Plan:   Case staffed with Dr. Clovis Pu. Discussed patient's request to increase Adderall to previous dose of 30 mg twice daily.  Discussed wanting to have patient be seen in the office to monitor blood pressure before increasing Adderall to 30 mg twice daily.  Discussed also wanting to ensure that anxiety and insomnia are controlled before increasing Adderall since higher doses of Adderall could potentially worsen the signs and symptoms.  Discussed that these signs and symptoms have improved somewhat since initiation of Trileptal.  Discussed increasing Adderall to 20 mg twice daily until next visit when blood pressure can be monitored.  Will increase Trileptal to 450 mg at bedtime for 5 days, and then patient may increase to 600 mg at bedtime as tolerated to improve anxiety, insomnia, and mood. Will continue Wellbutrin XL 300 mg daily for depression. We will continue trazodone for insomnia. Agree that therapy would likely be beneficial and assisted patient with therapy referrals and trying to find a therapist that would be covered by her insurance. Patient to follow-up in 6 weeks or sooner if clinically indicated. Patient advised to contact office with any questions, adverse effects, or acute worsening in signs and symptoms.  Gina Wallace was seen today for anxiety, depression and  add.  Diagnoses and all orders for this visit:  History of ADHD  Attention deficit hyperactivity disorder (ADHD), predominantly hyperactive type  Bipolar affective disorder, current episode mixed, current episode severity unspecified ([facility]) -     buPROPion (WELLBUTRIN XL) 300 MG 24 hr  tablet; Take 1 tablet (300 mg total) by mouth daily. -     Oxcarbazepine (TRILEPTAL) 300 MG tablet; Take 1.5 tabs po QHS x 5 days, then increase to 2 tab po QHS as tolerated  Generalized anxiety disorder -     Oxcarbazepine (TRILEPTAL) 300 MG tablet; Take 1.5 tabs po QHS x 5 days, then increase to 2 tab po QHS as tolerated    Please see After Visit Summary for patient specific instructions.  Future Appointments  Date Time Provider [place]  01/05/2019 11:00 AM Gillis Santa, MD [facility] None  01/30/2019  1:30 PM Thayer Headings, PMHNP [facility] None    No orders of the defined types were placed in this encounter.     -------------------------------

## 2018-12-29 NOTE — Telephone Encounter (Signed)
Pt would like you to send a referral to [facility] ( [place]) for her to see a therapist there. FAX: [phone] Gemma Payor

## 2018-12-30 ENCOUNTER — Encounter: Payer: Self-pay | Admitting: Psychiatry

## 2018-12-30 MED ORDER — AMPHETAMINE-DEXTROAMPHETAMINE 20 MG PO TABS: 20.0000 mg | ORAL_TABLET | Freq: Two times a day (BID) | ORAL | 0 refills | Status: DC

## 2019-01-03 DIAGNOSIS — H353124 Nonexudative age-related macular degeneration, left eye, advanced atrophic with subfoveal involvement: Secondary | ICD-10-CM | POA: Diagnosis not present

## 2019-01-03 DIAGNOSIS — H35372 Puckering of macula, left eye: Secondary | ICD-10-CM | POA: Diagnosis not present

## 2019-01-03 DIAGNOSIS — H353113 Nonexudative age-related macular degeneration, right eye, advanced atrophic without subfoveal involvement: Secondary | ICD-10-CM | POA: Diagnosis not present

## 2019-01-03 DIAGNOSIS — H35342 Macular cyst, hole, or pseudohole, left eye: Secondary | ICD-10-CM | POA: Diagnosis not present

## 2019-01-04 ENCOUNTER — Encounter: Payer: Self-pay | Admitting: Student in an Organized Health Care Education/Training Program
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
## 2019-01-05 ENCOUNTER — Ambulatory Visit (HOSPITAL_BASED_OUTPATIENT_CLINIC_OR_DEPARTMENT_OTHER): Payer: Medicare HMO | Admitting: Student in an Organized Health Care Education/Training Program

## 2019-01-05 ENCOUNTER — Other Ambulatory Visit
Admission: RE | Admit: 2019-01-05 | Discharge: 2019-01-05 | Disposition: A | Discharge status: Home / Self Care | Payer: Medicare HMO | Source: Ambulatory Visit | Attending: Student in an Organized Health Care Education/Training Program | Admitting: Student in an Organized Health Care Education/Training Program

## 2019-01-05 ENCOUNTER — Encounter: Payer: Self-pay | Admitting: Student in an Organized Health Care Education/Training Program

## 2019-01-05 ENCOUNTER — Other Ambulatory Visit: Payer: Self-pay

## 2019-01-05 VITALS — BP 147/80 | HR 88 | Temp 98.5°F | Resp 16 | Ht 68.5 in | Wt 245.0 lb

## 2019-01-05 DIAGNOSIS — I10 Essential (primary) hypertension: Secondary | ICD-10-CM | POA: Insufficient documentation

## 2019-01-05 DIAGNOSIS — Z79899 Other long term (current) drug therapy: Secondary | ICD-10-CM | POA: Diagnosis not present

## 2019-01-05 DIAGNOSIS — M62838 Other muscle spasm: Secondary | ICD-10-CM | POA: Diagnosis not present

## 2019-01-05 DIAGNOSIS — F325 Major depressive disorder, single episode, in full remission: Secondary | ICD-10-CM | POA: Insufficient documentation

## 2019-01-05 DIAGNOSIS — M542 Cervicalgia: Secondary | ICD-10-CM | POA: Insufficient documentation

## 2019-01-05 DIAGNOSIS — Z803 Family history of malignant neoplasm of breast: Secondary | ICD-10-CM | POA: Insufficient documentation

## 2019-01-05 DIAGNOSIS — G894 Chronic pain syndrome: Secondary | ICD-10-CM

## 2019-01-05 DIAGNOSIS — M797 Fibromyalgia: Secondary | ICD-10-CM

## 2019-01-05 DIAGNOSIS — M7918 Myalgia, other site: Secondary | ICD-10-CM | POA: Diagnosis not present

## 2019-01-05 DIAGNOSIS — Z87891 Personal history of nicotine dependence: Secondary | ICD-10-CM | POA: Diagnosis not present

## 2019-01-05 DIAGNOSIS — Z818 Family history of other mental and behavioral disorders: Secondary | ICD-10-CM | POA: Insufficient documentation

## 2019-01-05 DIAGNOSIS — M19049 Primary osteoarthritis, unspecified hand: Secondary | ICD-10-CM | POA: Insufficient documentation

## 2019-01-05 DIAGNOSIS — M5136 Other intervertebral disc degeneration, lumbar region: Secondary | ICD-10-CM | POA: Diagnosis not present

## 2019-01-05 LAB — URINE DRUG SCREEN, QUALITATIVE (ARMC ONLY)
Amphetamines, Ur Screen: POSITIVE — AB
Barbiturates, Ur Screen: NOT DETECTED
Benzodiazepine, Ur Scrn: NOT DETECTED
Cannabinoid 50 Ng, Ur ~~LOC~~: NOT DETECTED
Cocaine Metabolite,Ur ~~LOC~~: NOT DETECTED
MDMA (Ecstasy)Ur Screen: NOT DETECTED
Methadone Scn, Ur: NOT DETECTED
Opiate, Ur Screen: NOT DETECTED
Phencyclidine (PCP) Ur S: NOT DETECTED
Tricyclic, Ur Screen: NOT DETECTED

## 2019-01-05 NOTE — Progress Notes (Signed)
Safety precautions to be maintained throughout the outpatient stay will include: orient to surroundings, keep bed in low position, maintain call bell within reach at all times, provide assistance with transfer out of bed and ambulation.  

## 2019-01-10 ENCOUNTER — Telehealth: Payer: Self-pay

## 2019-01-10 ENCOUNTER — Other Ambulatory Visit: Payer: Self-pay

## 2019-01-10 ENCOUNTER — Encounter: Payer: Self-pay | Admitting: Licensed Clinical Social Worker

## 2019-01-10 ENCOUNTER — Ambulatory Visit (INDEPENDENT_AMBULATORY_CARE_PROVIDER_SITE_OTHER): Payer: Medicare HMO | Admitting: Licensed Clinical Social Worker

## 2019-01-10 DIAGNOSIS — F3160 Bipolar disorder, current episode mixed, unspecified: Secondary | ICD-10-CM

## 2019-01-10 DIAGNOSIS — Z8659 Personal history of other mental and behavioral disorders: Secondary | ICD-10-CM

## 2019-01-10 DIAGNOSIS — F411 Generalized anxiety disorder: Secondary | ICD-10-CM

## 2019-01-10 DIAGNOSIS — G894 Chronic pain syndrome: Secondary | ICD-10-CM

## 2019-01-10 NOTE — Telephone Encounter (Signed)
Dr. Carlos Wallace cannot see her until mid August and she wants to know if there is someone else she can go to for the med pysc eval. Do you have any recommendations?
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
## 2019-01-17 DIAGNOSIS — E669 Obesity, unspecified: Secondary | ICD-10-CM | POA: Diagnosis not present

## 2019-01-18 DIAGNOSIS — Z0000 Encounter for general adult medical examination without abnormal findings: Secondary | ICD-10-CM | POA: Diagnosis not present

## 2019-01-18 DIAGNOSIS — F325 Major depressive disorder, single episode, in full remission: Secondary | ICD-10-CM | POA: Diagnosis not present

## 2019-01-18 DIAGNOSIS — I10 Essential (primary) hypertension: Secondary | ICD-10-CM | POA: Diagnosis not present

## 2019-01-18 DIAGNOSIS — R05 Cough: Secondary | ICD-10-CM | POA: Diagnosis not present

## 2019-01-20 DIAGNOSIS — E669 Obesity, unspecified: Secondary | ICD-10-CM | POA: Diagnosis not present

## 2019-01-23 ENCOUNTER — Telehealth: Payer: Self-pay | Admitting: Psychiatry

## 2019-01-23 NOTE — Telephone Encounter (Signed)
Patient needs to see a Psychiatrist in house she has a important question for you can you please give her a call

## 2019-01-23 NOTE — Telephone Encounter (Signed)
Pt. Returned my call. She just needs an appt. With [facility]. She is going to  schedule one. She recently started seeing a Pain Clinic and it is required to see a Psychiatrist. If they will not approve for Gina Wallace to see her then she will call and schedule with one of the psychiatrist we have.

## 2019-01-25 ENCOUNTER — Telehealth: Payer: Self-pay | Admitting: Psychiatry

## 2019-01-25 NOTE — Telephone Encounter (Signed)
Pt has an appointment scheduled on 7/20. Inquiring if she can have a substance abuse assessment done during this appointment. Would like JC to call her prior to appt to indicate if this request is possible.

## 2019-01-27 NOTE — Telephone Encounter (Signed)
Left detailed information

## 2019-01-27 NOTE — Telephone Encounter (Signed)
Pt called back didn't listen to voicemail but given information

## 2019-01-30 ENCOUNTER — Ambulatory Visit (INDEPENDENT_AMBULATORY_CARE_PROVIDER_SITE_OTHER): Payer: Medicare HMO | Admitting: Psychiatry

## 2019-01-30 ENCOUNTER — Encounter: Payer: Self-pay | Admitting: Psychiatry

## 2019-01-30 ENCOUNTER — Other Ambulatory Visit: Payer: Self-pay

## 2019-01-30 DIAGNOSIS — F3160 Bipolar disorder, current episode mixed, unspecified: Secondary | ICD-10-CM

## 2019-01-30 DIAGNOSIS — G4700 Insomnia, unspecified: Secondary | ICD-10-CM | POA: Diagnosis not present

## 2019-01-30 DIAGNOSIS — F901 Attention-deficit hyperactivity disorder, predominantly hyperactive type: Secondary | ICD-10-CM | POA: Diagnosis not present

## 2019-01-30 DIAGNOSIS — F411 Generalized anxiety disorder: Secondary | ICD-10-CM

## 2019-01-30 MED ORDER — TRAZODONE HCL 150 MG PO TABS: ORAL_TABLET | ORAL | 1 refills | Status: DC

## 2019-01-30 MED ORDER — AMPHETAMINE-DEXTROAMPHETAMINE 20 MG PO TABS: 20.0000 mg | ORAL_TABLET | Freq: Two times a day (BID) | ORAL | 0 refills | Status: DC

## 2019-01-30 MED ORDER — OXCARBAZEPINE 300 MG PO TABS: 600.0000 mg | ORAL_TABLET | Freq: Every day | ORAL | 0 refills | Status: DC

## 2019-01-30 MED ORDER — AMPHETAMINE-DEXTROAMPHETAMINE 20 MG PO TABS: 20.0000 mg | ORAL_TABLET | Freq: Every day | ORAL | 0 refills | Status: DC

## 2019-01-30 NOTE — Progress Notes (Signed)
Gina Wallace [id number] March 16, 1956 63 y.o.  Virtual Visit via Telephone Note  I connected with pt on 01/30/19 at  1:30 PM EDT by telephone and verified that I am speaking with the correct person using two identifiers.   I discussed the limitations, risks, security and privacy concerns of performing an evaluation and management service by telephone and the availability of in person appointments. I also discussed with the patient that there may be a patient responsible charge related to this service. The patient expressed understanding and agreed to proceed.   I discussed the assessment and treatment plan with the patient. The patient was provided an opportunity to ask questions and all were answered. The patient agreed with the plan and demonstrated an understanding of the instructions.   The patient was advised to call back or seek an in-person evaluation if the symptoms worsen or if the condition fails to improve as anticipated.  I provided 25 minutes of non-face-to-face time during this encounter.  The patient was located at home.  The provider was located at [facility].   Thayer Headings, PMHNP   [name] is a 63 y.o. (DOB April 04, 1956) female.  Chief Complaint:  Chief Complaint  Patient presents with  . Follow-up    ADD, Anxiety, Mood disturbance    HPI Gina Wallace presents for follow-up of ADD, Anxiety, and mood disturbance. She reports that she is "30% better." She reports that anxiety has been less and her depression has also been less. Reports that negative thoughts have decreased. She reports that she has noticed her motivation has also improved. Her energy has improved as well. She reports that her sleep has been adequate and feels that sleep quality has improved with Trileptal. She reports that Adderall 20 mg po BID seems to be effective at this time. She reports that her concentration has been adequate for longer periods  of time. Reports that she has been able to be more productive. She reports that she continues to have some anxiety which she describes as worry and feeling overwhelmed and reports that this is no longer occurring constantly. Denies SI.   Denies any risky or impulsive behavior. She reports some slight irritability and that this may occur with hypoglycemia.   She reports that she has seen a weight loss specialist recently since she has gained weight that she lost over 3 years. Reports that she was prescribed Phentermine and that it has helped with her satiety.   Past Psychiatric Medication Trials: Reports h/o needing higher doses of medications since childhood Lithium- "I just don't want to take Lithium." Lamictal- denies adverse effects. May have been helpful. Trileptal Adderall  Wellbutrin XL- Has been effective. Cymbalta- Took x 3 months and had "severe reaction" Zoloft Effexor- Seemed to be effective Klonopin- Reports taking prn for anxiety a few times a week.  Trazodone- Has taken 150 mg in the past and recently taking 300 mg. Effective.  Ambien Vistaril- Ineffective Zyprexa- Reports that she took for only a week and stopped due to severe nightmares/lucid dreams  Review of Systems:  Review of Systems  Cardiovascular: Negative for palpitations.  Musculoskeletal: Positive for myalgias. Negative for gait problem.  Neurological: Negative for tremors.  Psychiatric/Behavioral:       Please refer to HPI    Medications: I have reviewed the patient's current medications.  Current Outpatient Medications  Medication Sig Dispense Refill  . Apoaequorin (PREVAGEN PO) Take by mouth.    Marland Kitchen buPROPion (WELLBUTRIN XL)  300 MG 24 hr tablet Take 1 tablet (300 mg total) by mouth daily. 90 tablet 0  . CANNABIDIOL PO Take by mouth daily.    . Multiple Vitamins-Minerals (VISION PLUS PO) Take by mouth.    . Oxcarbazepine (TRILEPTAL) 300 MG tablet Take 2 tablets (600 mg total) by mouth at bedtime. 180  tablet 0  . tiZANidine (ZANAFLEX) 4 MG capsule Take 4 mg by mouth 3 (three) times daily as needed for muscle spasms.    . traMADol (ULTRAM) 50 MG tablet Take by mouth every 6 (six) hours as needed.    . traZODone (DESYREL) 150 MG tablet Take 1-2 tabs po QHS 180 tablet 1  . [START ON 02/05/2019] amphetamine-dextroamphetamine (ADDERALL) 20 MG tablet Take 1 tablet (20 mg total) by mouth 2 (two) times daily. 60 tablet 0  . [START ON 03/05/2019] amphetamine-dextroamphetamine (ADDERALL) 20 MG tablet Take 1 tablet (20 mg total) by mouth daily. 60 tablet 0  . [START ON 04/02/2019] amphetamine-dextroamphetamine (ADDERALL) 20 MG tablet Take 1 tablet (20 mg total) by mouth 2 (two) times daily. 60 tablet 0  . lisinopril (PRINIVIL,ZESTRIL) 5 MG tablet Take by mouth. Pt takes 4 tabs each AM     No current facility-administered medications for this visit.     Medication Side Effects: None  Allergies: No Known Allergies  Past Medical History:  Diagnosis Date  . ADHD (attention deficit hyperactivity disorder)   . Anxiety   . Bipolar disorder ([place])   . Complication of anesthesia    pt requires larger doses of meds for effect  . Depression   . Fibromyalgia   . Hypertension   . Macular degeneration   . Wears dentures    full upper and lower    Family History  Problem Relation Age of Onset  . Anxiety disorder Mother   . Depression Mother   . Breast cancer Mother 71  . Alcohol abuse Maternal Uncle   . Breast cancer Maternal Aunt   . Breast cancer Maternal Grandmother     Social History   Socioeconomic History  . Marital status: Married    Spouse name: sam  . Number of children: 2  . Years of education: Not on file  . Highest education level: Associate degree: occupational, Hotel manager, or vocational program  Occupational History  . Not on file  Social Needs  . Financial resource strain: Not hard at all  . Food insecurity    Worry: Never true    Inability: Never true  . Transportation  needs    Medical: No    Non-medical: No  Tobacco Use  . Smoking status: Former Smoker    Types: Cigarettes    Quit date: 12/14/2007    Years since quitting: 11.1  . Smokeless tobacco: Never Used  Substance and Sexual Activity  . Alcohol use: Not Currently    Comment: may have drink on Holidays  . Drug use: Never  . Sexual activity: Yes    Partners: Male    Birth control/protection: None  Lifestyle  . Physical activity    Days per week: 0 days    Minutes per session: 0 min  . Stress: Very much  Relationships  . Social Herbalist on phone: Not on file    Gets together: Not on file    Attends religious service: Never    Active member of club or organization: No    Attends meetings of clubs or organizations: Never    Relationship status: Married  .  Intimate partner violence    Fear of current or ex partner: No    Emotionally abused: No    Physically abused: No    Forced sexual activity: No  Other Topics Concern  . Not on file  Social History Narrative  . Not on file    Past Medical History, Surgical history, Social history, and Family history were reviewed and updated as appropriate.   Please see review of systems for further details on the patient's review from today.   Objective:   Physical Exam:  There were no vitals taken for this visit.  Physical Exam Neurological:     Mental Status: She is alert and oriented to person, place, and time.     Cranial Nerves: No dysarthria.  Psychiatric:        Attention and Perception: Attention normal.        Speech: Speech normal.        Behavior: Behavior is cooperative.        Thought Content: Thought content normal. Thought content is not paranoid or delusional. Thought content does not include homicidal or suicidal ideation. Thought content does not include homicidal or suicidal plan.        Cognition and Memory: Cognition and memory normal.        Judgment: Judgment normal.     Comments: Patient presents as  less anxious and less depressed compared to previous visits.  Her thoughts present as more organized.     Lab Review:  No results found for: NA, K, CL, CO2, GLUCOSE, BUN, CREATININE, CALCIUM, PROT, ALBUMIN, AST, ALT, ALKPHOS, BILITOT, GFRNONAA, GFRAA  No results found for: WBC, RBC, HGB, HCT, PLT, MCV, MCH, MCHC, RDW, LYMPHSABS, MONOABS, EOSABS, BASOSABS  No results found for: POCLITH, LITHIUM   No results found for: PHENYTOIN, PHENOBARB, VALPROATE, CBMZ   .res Assessment: Plan:   Patient seen for 30 minutes and greater than 50% of visit spent counseling patient and coordinating care. Discussed patient seeking psychological evaluation for the purposes of pain management.  Discussed that these type of evaluations are typically done by a psychologist and that this provider has only been seeing patient for a brief period of time and has not met patient in person, and therefore would not be in the position to be able to assess risks of substance abuse. Provided pt with an additional referral for Norval Gable, PhD, who has experience performing these type of evaluations.  Patient reports that current medications are effectively treating target signs and symptoms without any significant tolerability issues and would therefore like to continue current medications without changes.  Recommend continuing psychotherapy.  Patient to follow-up with this provider in 3 months or sooner if clinically indicated.  Patient advised to contact office with any questions, adverse effects, or acute worsening in signs and symptoms.   Gina Wallace was seen today for follow-up.  Diagnoses and all orders for this visit:  Attention deficit hyperactivity disorder (ADHD), predominantly hyperactive type -     amphetamine-dextroamphetamine (ADDERALL) 20 MG tablet; Take 1 tablet (20 mg total) by mouth 2 (two) times daily. -     amphetamine-dextroamphetamine (ADDERALL) 20 MG tablet; Take 1 tablet (20 mg total) by mouth  daily. -     amphetamine-dextroamphetamine (ADDERALL) 20 MG tablet; Take 1 tablet (20 mg total) by mouth 2 (two) times daily.  Bipolar affective disorder, current episode mixed, current episode severity unspecified ([facility]) -     Oxcarbazepine (TRILEPTAL) 300 MG tablet; Take 2 tablets (600 mg total) by  mouth at bedtime.  Generalized anxiety disorder -     Oxcarbazepine (TRILEPTAL) 300 MG tablet; Take 2 tablets (600 mg total) by mouth at bedtime.  Insomnia, unspecified type -     traZODone (DESYREL) 150 MG tablet; Take 1-2 tabs po QHS    Please see After Visit Summary for patient specific instructions.  Future Appointments  Date Time Provider [place]  02/01/2019 12:30 PM Alden Hipp, LCSW [facility] None  02/16/2019 11:00 AM Ursula Alert, MD [facility] None    No orders of the defined types were placed in this encounter.     -------------------------------

## 2019-01-30 NOTE — Addendum Note (Signed)
Addended by: Sharyl Nimrod on: 01/30/2019 03:28 PM   Modules accepted: Orders

## 2019-02-01 ENCOUNTER — Encounter: Payer: Self-pay | Admitting: Licensed Clinical Social Worker

## 2019-02-01 ENCOUNTER — Other Ambulatory Visit: Payer: Self-pay

## 2019-02-01 ENCOUNTER — Ambulatory Visit (INDEPENDENT_AMBULATORY_CARE_PROVIDER_SITE_OTHER): Payer: Medicare HMO | Admitting: Licensed Clinical Social Worker

## 2019-02-01 DIAGNOSIS — F901 Attention-deficit hyperactivity disorder, predominantly hyperactive type: Secondary | ICD-10-CM

## 2019-02-01 DIAGNOSIS — F411 Generalized anxiety disorder: Secondary | ICD-10-CM | POA: Diagnosis not present

## 2019-02-01 DIAGNOSIS — F3160 Bipolar disorder, current episode mixed, unspecified: Secondary | ICD-10-CM

## 2019-02-01 NOTE — Progress Notes (Signed)
Virtual Visit via Telephone Note  I connected with Gina Wallace on 02/01/19 at 12:30 PM EDT by telephone and verified that I am speaking with the correct person using two identifiers.   I discussed the limitations, risks, security and privacy concerns of performing an evaluation and management service by telephone and the availability of in person appointments. I also discussed with the patient that there may be a patient responsible charge related to this service. The patient expressed understanding and agreed to proceed.    I discussed the assessment and treatment plan with the patient. The patient was provided an opportunity to ask questions and all were answered. The patient agreed with the plan and demonstrated an understanding of the instructions.   The patient was advised to call back or seek an in-person evaluation if the symptoms worsen or if the condition fails to improve as anticipated.  I provided 60 minutes of non-face-to-face time during this encounter.   Alden Hipp, LCSW    THERAPIST PROGRESS NOTE  Session Time: 1230 ] Participation Level: Active  Behavioral Response: CasualAlertDepressed  Type of Therapy: Individual Therapy  Treatment Goals addressed: Coping  Interventions: Supportive  Summary: Gina Wallace is a 63 y.o. female who presents with continued symptoms related to her diagnosis. Gina Wallace reports doing well since our last session. She reports she has been taking her medications and believes that is helping. We started discussing her childhood and how events from that time period are affecting her life currently. For instance, Gina Wallace believes her mother did not love  Her. She was able to give several examples to illustrate this point. This core belief was visible later in life as well, as she began to think others would leave her once they found out who she was. She reported feeling she was never able to show her true self for fear others would leave  her. We discussed ways to challenge negative beliefs moving forward, and the first step in that process was beginning to recognize negative thoughts as they come about. LCSW asked Gina Wallace to begin writing down her thoughts associated with negative feelings. Gina Wallace reported understanding and agreement with this plan.   Suicidal/Homicidal: No  Therapist Response: Gina Wallace continues to work towards her tx goals but has not yet reached them. We will continue to work on challenging negative thoughts by utilizing CBT.   Plan: Return again in 2 weeks.  Diagnosis: Axis I: Generalized Anxiety Disorder    Axis II: No diagnosis    Alden Hipp, LCSW 02/01/2019

## 2019-02-02 ENCOUNTER — Ambulatory Visit: Payer: Medicare HMO | Admitting: Student in an Organized Health Care Education/Training Program

## 2019-02-06 ENCOUNTER — Telehealth: Payer: Self-pay | Admitting: Psychiatry

## 2019-02-06 NOTE — Telephone Encounter (Signed)
Patient states all scripts are correct

## 2019-02-14 DIAGNOSIS — E669 Obesity, unspecified: Secondary | ICD-10-CM | POA: Diagnosis not present

## 2019-02-16 ENCOUNTER — Ambulatory Visit (INDEPENDENT_AMBULATORY_CARE_PROVIDER_SITE_OTHER): Payer: Medicare HMO | Admitting: Psychiatry

## 2019-02-16 ENCOUNTER — Other Ambulatory Visit: Payer: Self-pay

## 2019-02-16 DIAGNOSIS — G894 Chronic pain syndrome: Secondary | ICD-10-CM

## 2019-02-16 NOTE — Progress Notes (Signed)
Patient ID: Gina Wallace, female   DOB: [birth year], 63 y.o.   MRN: [id number]   Patient here for Pain provider clearance , did not mail back screening package. Asked Jonelle Sidle to call back.

## 2019-02-16 NOTE — Progress Notes (Signed)
Patient did not pick up when I called for pre-screening at 9:40am. LVM to call me back if she gets my message before 10:30am to do the pre-screening before her 11:00am appointment.

## 2019-02-17 ENCOUNTER — Telehealth: Payer: Self-pay | Admitting: Psychiatry

## 2019-02-17 NOTE — Telephone Encounter (Signed)
Pt thinks that the jump to 600mg  of Trileptel has caused her nausea. Did you mean to RX the 600mg  per night and if not please call her to let her know. She had started 600mg  but then had nausea and so decreased to 300mg 

## 2019-02-17 NOTE — Telephone Encounter (Signed)
Clarified with Pamala Hurry and she remembered that when I explained her taper up on her Trileptal, she appreciated the call back. Informed her Kristopher Oppenheim is notified of early refill of Trazodone and she can pick up today. Instructed to call back with any problems or concerns.

## 2019-02-17 NOTE — Telephone Encounter (Signed)
Also, pt is having to leave town this weekend and will need her Trazodone refilled prior to this today. Her grandson has cancer and she'll be going to see him out of town. Can you call in early RF to [facility] in [place]?

## 2019-02-24 ENCOUNTER — Ambulatory Visit: Payer: Medicare HMO | Admitting: Licensed Clinical Social Worker

## 2019-02-24 ENCOUNTER — Other Ambulatory Visit: Payer: Self-pay

## 2019-03-10 ENCOUNTER — Ambulatory Visit (INDEPENDENT_AMBULATORY_CARE_PROVIDER_SITE_OTHER): Payer: Medicare HMO | Admitting: Licensed Clinical Social Worker

## 2019-03-10 ENCOUNTER — Encounter: Payer: Self-pay | Admitting: Licensed Clinical Social Worker

## 2019-03-10 ENCOUNTER — Other Ambulatory Visit: Payer: Self-pay

## 2019-03-10 DIAGNOSIS — F3160 Bipolar disorder, current episode mixed, unspecified: Secondary | ICD-10-CM

## 2019-03-10 DIAGNOSIS — F411 Generalized anxiety disorder: Secondary | ICD-10-CM | POA: Diagnosis not present

## 2019-03-10 DIAGNOSIS — F901 Attention-deficit hyperactivity disorder, predominantly hyperactive type: Secondary | ICD-10-CM

## 2019-03-10 MED ORDER — OXCARBAZEPINE 300 MG PO TABS: 600.0000 mg | ORAL_TABLET | Freq: Every day | ORAL | 0 refills | Status: DC

## 2019-03-10 NOTE — Progress Notes (Signed)
Virtual Visit via Video Note  I connected with Gina Wallace on 03/10/19 at 10:00 AM EDT by a video enabled telemedicine application and verified that I am speaking with the correct person using two identifiers.   I discussed the limitations of evaluation and management by telemedicine and the availability of in person appointments. The patient expressed understanding and agreed to proceed.  I discussed the assessment and treatment plan with the patient. The patient was provided an opportunity to ask questions and all were answered. The patient agreed with the plan and demonstrated an understanding of the instructions.   The patient was advised to call back or seek an in-person evaluation if the symptoms worsen or if the condition fails to improve as anticipated.  I provided 55 minutes of non-face-to-face time during this encounter.   Alden Hipp, LCSW    THERAPIST PROGRESS NOTE  Session Time: 1000  Participation Level: Active  Behavioral Response: NeatAlertIrritable  Type of Therapy: Individual Therapy  Treatment Goals addressed: Coping  Interventions: Supportive  Summary: Gina Wallace is a 63 y.o. female who presents with continued symptoms of her diagnosis. Gina Wallace reports doing well since our last session. She reports she was in [place] for a week with her grandchild with cancer. She reports she had friends in town recently as well that left this morning. Gina Wallace was scattered while in session today, and kept stating, "I don't know how to answer that question." She reported feeling she is always racing from one thing to the next, and isn't able to fully recover from the previous event. LCSW validated these ideas and expressed Gina Wallace is likely not processing each situation prior to moving on to the next, which is causing her to feel tired. Gina Wallace was able to agree with this point. Gina Wallace went on to discuss feeling she isn't sure therapy will be helpful for her once a  month. LCSW explained that we were supposed to see each other two weeks ago but she no-showed her appointment. Gina Wallace stated, "yeah, I guess. I just don't know how this will help." LCSW explained the ideas behind therapy and how we can utilize our time, no matter when it is, to address problems she is experiencing. She stated she does not have any problems at the moment and it's not something "that simple." LCSW explained often therapy takes many forms, and can be helpful in a variety of settings. She stated her primary problem is time management and organization. She reports getting tired of her organization and doing a complete re-do of her systems, and "I'll stay with it for a week or so then I fall off." LCSW explained that large changes are often not sustainable, and need to be broken down into smaller goals. Gina Wallace had many reasons she was not able to do this, or why this has not worked in the past. LCSW attempted to explain SMART goals and how they can be helpful when trying to make lasting change. Gina Wallace expressed understanding but again stated several reasons she did not think it would work. LCSW explained how self talk can have a lot to do with the outcome, and if she feels she won't complete something--she likely will not. Gina Wallace expressed understanding and agreement.   Suicidal/Homicidal: No  Therapist Response: Gina Wallace continues to work towards her tx goals but has not yet reached them. We will continue to work on organization and time management, as well as challenging negative thoughts moving forward.   Plan: Return again in 2 weeks.  Diagnosis:  Axis I: Generalized Anxiety Disorder    Axis II: No diagnosis    Alden Hipp, LCSW 03/10/2019

## 2019-03-16 ENCOUNTER — Other Ambulatory Visit: Payer: Self-pay

## 2019-03-16 DIAGNOSIS — F411 Generalized anxiety disorder: Secondary | ICD-10-CM

## 2019-03-16 DIAGNOSIS — F3160 Bipolar disorder, current episode mixed, unspecified: Secondary | ICD-10-CM

## 2019-03-16 DIAGNOSIS — G4700 Insomnia, unspecified: Secondary | ICD-10-CM

## 2019-03-16 MED ORDER — TRAZODONE HCL 150 MG PO TABS: ORAL_TABLET | ORAL | 1 refills | Status: DC

## 2019-03-16 MED ORDER — OXCARBAZEPINE 300 MG PO TABS: 600.0000 mg | ORAL_TABLET | Freq: Every day | ORAL | 1 refills | Status: DC

## 2019-03-22 ENCOUNTER — Encounter: Payer: Self-pay | Admitting: Psychiatry

## 2019-03-22 ENCOUNTER — Other Ambulatory Visit: Payer: Self-pay

## 2019-03-22 ENCOUNTER — Ambulatory Visit (INDEPENDENT_AMBULATORY_CARE_PROVIDER_SITE_OTHER): Payer: Medicare HMO | Admitting: Psychiatry

## 2019-03-22 DIAGNOSIS — G894 Chronic pain syndrome: Secondary | ICD-10-CM | POA: Diagnosis not present

## 2019-03-22 DIAGNOSIS — Z008 Encounter for other general examination: Secondary | ICD-10-CM

## 2019-03-22 DIAGNOSIS — Z8659 Personal history of other mental and behavioral disorders: Secondary | ICD-10-CM

## 2019-03-22 DIAGNOSIS — F3170 Bipolar disorder, currently in remission, most recent episode unspecified: Secondary | ICD-10-CM | POA: Insufficient documentation

## 2019-03-22 NOTE — Progress Notes (Signed)
error 
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
## 2019-03-24 DIAGNOSIS — E669 Obesity, unspecified: Secondary | ICD-10-CM | POA: Diagnosis not present

## 2019-03-24 DIAGNOSIS — Z6836 Body mass index (BMI) 36.0-36.9, adult: Secondary | ICD-10-CM | POA: Diagnosis not present

## 2019-03-24 DIAGNOSIS — E6609 Other obesity due to excess calories: Secondary | ICD-10-CM | POA: Diagnosis not present

## 2019-04-03 ENCOUNTER — Ambulatory Visit: Payer: Medicare HMO | Admitting: Licensed Clinical Social Worker

## 2019-04-11 ENCOUNTER — Encounter: Payer: Self-pay | Admitting: Student in an Organized Health Care Education/Training Program

## 2019-04-12 ENCOUNTER — Other Ambulatory Visit: Payer: Self-pay

## 2019-04-12 ENCOUNTER — Ambulatory Visit
Payer: Medicare HMO | Attending: Student in an Organized Health Care Education/Training Program | Admitting: Student in an Organized Health Care Education/Training Program

## 2019-04-12 ENCOUNTER — Encounter: Payer: Self-pay | Admitting: Student in an Organized Health Care Education/Training Program

## 2019-04-12 DIAGNOSIS — M7918 Myalgia, other site: Secondary | ICD-10-CM | POA: Diagnosis not present

## 2019-04-12 DIAGNOSIS — M542 Cervicalgia: Secondary | ICD-10-CM | POA: Diagnosis not present

## 2019-04-12 DIAGNOSIS — M797 Fibromyalgia: Secondary | ICD-10-CM | POA: Insufficient documentation

## 2019-04-12 DIAGNOSIS — G894 Chronic pain syndrome: Secondary | ICD-10-CM

## 2019-04-12 MED ORDER — HYDROCODONE-ACETAMINOPHEN 5-325 MG PO TABS: 1.0000 | ORAL_TABLET | Freq: Four times a day (QID) | ORAL | 0 refills | Status: AC | PRN

## 2019-04-12 MED ORDER — TRAMADOL HCL 50 MG PO TABS: 50.0000 mg | ORAL_TABLET | Freq: Four times a day (QID) | ORAL | 1 refills | Status: DC | PRN
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
## 2019-05-09 ENCOUNTER — Encounter: Payer: Self-pay | Admitting: Psychiatry

## 2019-05-09 ENCOUNTER — Ambulatory Visit (INDEPENDENT_AMBULATORY_CARE_PROVIDER_SITE_OTHER): Payer: Medicare HMO | Admitting: Psychiatry

## 2019-05-09 ENCOUNTER — Other Ambulatory Visit: Payer: Self-pay

## 2019-05-09 DIAGNOSIS — F3160 Bipolar disorder, current episode mixed, unspecified: Secondary | ICD-10-CM | POA: Diagnosis not present

## 2019-05-09 DIAGNOSIS — F901 Attention-deficit hyperactivity disorder, predominantly hyperactive type: Secondary | ICD-10-CM

## 2019-05-09 DIAGNOSIS — F411 Generalized anxiety disorder: Secondary | ICD-10-CM | POA: Diagnosis not present

## 2019-05-09 MED ORDER — AMPHETAMINE-DEXTROAMPHETAMINE 20 MG PO TABS: 20.0000 mg | ORAL_TABLET | Freq: Two times a day (BID) | ORAL | 0 refills | Status: DC

## 2019-05-09 MED ORDER — OXCARBAZEPINE 300 MG PO TABS: ORAL_TABLET | ORAL | 1 refills | Status: DC

## 2019-05-09 NOTE — Progress Notes (Signed)
[name] [id number] 09-01-1955 63 y.o.  Virtual Visit via Telephone Note  I connected with pt on 05/09/19 at 11:30 AM EDT by telephone and verified that I am speaking with the correct person using two identifiers.   I discussed the limitations, risks, security and privacy concerns of performing an evaluation and management service by telephone and the availability of in person appointments. I also discussed with the patient that there may be a patient responsible charge related to this service. The patient expressed understanding and agreed to proceed.   I discussed the assessment and treatment plan with the patient. The patient was provided an opportunity to ask questions and all were answered. The patient agreed with the plan and demonstrated an understanding of the instructions.   The patient was advised to call back or seek an in-person evaluation if the symptoms worsen or if the condition fails to improve as anticipated.  I provided 25 minutes of non-face-to-face time during this encounter.  The patient was located at home.  The provider was located at [place].   Thayer Headings, PMHNP   [name] is a 63 y.o. (DOB 1956-02-09) female.  Chief Complaint:  Chief Complaint  Patient presents with  . Anxiety  . Depression  . ADD  . Insomnia    HPI Gina Wallace presents for follow-up of mood, anxiety, and ADD. She reports that she is having fibromyalgia flare today and that typically her mood is lower during flares. She reports that overall her mood is "up and down," usually based on pain level. She denies elevated mood or excessive energy and reports that energy and motivation are good when pain is controlled. Denies any periods of severe depression where she has not been able to function.  She reports that she stopped Wellbutrin a couple of months ago. She initially inadvertently forgot to pack it when she traveled She  reports that she would like to resume Wellbutrin. She would also like to stop Trileptal since she is concerned that Trileptal could be causing a change in vision since opthalmology to include episodic blurry vision and difficulty with depth perception, although she has noticed some improvement in sleep, anxiety, and mood with Trileptal. She reports that she continues to have low energy. She reports that she has been having significant anxiety. She reports frequent worry. Reports that she will periodically have to pull over because of SOB and anxiety. Occ increased heart rate with increased anxiety. She reports frequently worrying about what others are thinking. She reports improved concentration with increase in Adderall. Reports some intentional wt loss and has made dietary changes. Denies SI.   She reports that she has been sleeping well.   Denies SI.   Past Psychiatric Medication Trials: Reports h/o needing higher doses of medications since childhood Lithium- "I just don't want to take Lithium." Lamictal- denies adverse effects. May have been helpful. Trileptal Adderall  Wellbutrin XL- Has been effective. Cymbalta- Took x 3 months and had "severe reaction" Zoloft Effexor- Seemed to be effective Klonopin- Reports taking prn for anxiety a few times a week.  Trazodone- Has taken 150 mg in the past and recently taking 300 mg. Effective.  Ambien Vistaril- Ineffective Zyprexa- Reports that she took for only a week and stopped due to severe nightmares/lucid dreams   Review of Systems:  Review of Systems  Eyes: Positive for visual disturbance.  Respiratory: Positive for cough.   Musculoskeletal: Positive for arthralgias. Negative for gait problem.  Psychiatric/Behavioral:       Please refer to HPI    Medications: I have reviewed the patient's current medications.  Current Outpatient Medications  Medication Sig Dispense Refill  . [START ON 06/13/2019] amphetamine-dextroamphetamine  (ADDERALL) 20 MG tablet Take 1 tablet (20 mg total) by mouth 2 (two) times daily. 60 tablet 0  . Apoaequorin (PREVAGEN PO) Take by mouth.    Marland Kitchen HYDROcodone-acetaminophen (NORCO/VICODIN) 5-325 MG tablet Take 1 tablet by mouth every 6 (six) hours as needed for moderate pain.    Marland Kitchen lisinopril (ZESTRIL) 20 MG tablet     . LUTEIN PO Take by mouth.    . Multiple Vitamins-Minerals (VISION PLUS PO) Take by mouth.    . Oxcarbazepine (TRILEPTAL) 300 MG tablet Take 1 tab po QHS x 7 days, then 1/2 tab po QHS x 7 days, then stop. 180 tablet 1  . phentermine (ADIPEX-P) 37.5 MG tablet daily as needed.     Marland Kitchen tiZANidine (ZANAFLEX) 4 MG capsule Take 4 mg by mouth 3 (three) times daily as needed for muscle spasms.    . traMADol (ULTRAM) 50 MG tablet Take 1 tablet (50 mg total) by mouth every 6 (six) hours as needed. For chronic pain. Each Rx must last 30 days. 120 tablet 1  . traZODone (DESYREL) 150 MG tablet Take 1-2 tabs po QHS 180 tablet 1  . [START ON 05/16/2019] amphetamine-dextroamphetamine (ADDERALL) 20 MG tablet Take 1 tablet (20 mg total) by mouth 2 (two) times daily. 60 tablet 0  . [START ON 07/11/2019] amphetamine-dextroamphetamine (ADDERALL) 20 MG tablet Take 1 tablet (20 mg total) by mouth 2 (two) times daily. 60 tablet 0  . CANNABIDIOL PO Take by mouth daily.    Marland Kitchen lisinopril (PRINIVIL,ZESTRIL) 5 MG tablet Take by mouth. Pt takes 4 tabs each AM    . Phendimetrazine Tartrate 35 MG TABS 1 tab po TID     No current facility-administered medications for this visit.     Medication Side Effects: Other: Possible visual disturbance  Allergies: No Known Allergies  Past Medical History:  Diagnosis Date  . ADHD (attention deficit hyperactivity disorder)   . Anxiety   . Bipolar disorder ([place])   . Complication of anesthesia    pt requires larger doses of meds for effect  . Depression   . Fibromyalgia   . Hypertension   . Macular degeneration   . Wears dentures    full upper and lower    Family  History  Problem Relation Age of Onset  . Anxiety disorder Mother   . Depression Mother   . Breast cancer Mother 88  . Alcohol abuse Maternal Uncle   . Breast cancer Maternal Aunt   . Breast cancer Maternal Grandmother     Social History   Socioeconomic History  . Marital status: Married    Spouse name: sam  . Number of children: 2  . Years of education: Not on file  . Highest education level: Associate degree: occupational, Hotel manager, or vocational program  Occupational History  . Not on file  Social Needs  . Financial resource strain: Not hard at all  . Food insecurity    Worry: Never true    Inability: Never true  . Transportation needs    Medical: No    Non-medical: No  Tobacco Use  . Smoking status: Former Smoker    Types: Cigarettes    Quit date: 12/14/2007    Years since quitting: 11.4  . Smokeless tobacco: Never Used  Substance and  Sexual Activity  . Alcohol use: Not Currently    Comment: may have drink on Holidays  . Drug use: Never  . Sexual activity: Yes    Partners: Male    Birth control/protection: None  Lifestyle  . Physical activity    Days per week: 0 days    Minutes per session: 0 min  . Stress: Very much  Relationships  . Social Herbalist on phone: Not on file    Gets together: Not on file    Attends religious service: Never    Active member of club or organization: No    Attends meetings of clubs or organizations: Never    Relationship status: Married  . Intimate partner violence    Fear of current or ex partner: No    Emotionally abused: No    Physically abused: No    Forced sexual activity: No  Other Topics Concern  . Not on file  Social History Narrative  . Not on file    Past Medical History, Surgical history, Social history, and Family history were reviewed and updated as appropriate.   Please see review of systems for further details on the patient's review from today.   Objective:   Physical Exam:  There were  no vitals taken for this visit.  Physical Exam Neurological:     Mental Status: She is alert and oriented to person, place, and time.     Cranial Nerves: No dysarthria.  Psychiatric:        Attention and Perception: Attention normal.        Mood and Affect: Mood is anxious.        Speech: Speech normal.        Behavior: Behavior is cooperative.        Thought Content: Thought content normal. Thought content is not paranoid or delusional. Thought content does not include homicidal or suicidal ideation. Thought content does not include homicidal or suicidal plan.        Cognition and Memory: Cognition and memory normal.        Judgment: Judgment normal.     Lab Review:  No results found for: NA, K, CL, CO2, GLUCOSE, BUN, CREATININE, CALCIUM, PROT, ALBUMIN, AST, ALT, ALKPHOS, BILITOT, GFRNONAA, GFRAA  No results found for: WBC, RBC, HGB, HCT, PLT, MCV, MCH, MCHC, RDW, LYMPHSABS, MONOABS, EOSABS, BASOSABS  No results found for: POCLITH, LITHIUM   No results found for: PHENYTOIN, PHENOBARB, VALPROATE, CBMZ   .res Assessment: Plan:   Discussed patient's concerns about question of side effects with Trileptal and discussed that Trileptal could be decreased and then discontinued to determine if Trileptal is causing possible side effects.  Advised patient to contact office if she experiences worsening anxiety, mood, or insomnia and possible side effects do not improve. Discussed again that Klonopin would not be prescribed considering diet she is currently being prescribed opiates, tizanidine, and Adderall due to potential risks with this combination of medications and number of controlled substances. Patient to follow-up in 3 months or sooner if clinically indicated. Patient advised to contact office with any questions, adverse effects, or acute worsening in signs and symptoms.  Gina Wallace was seen today for anxiety, depression, add and insomnia.  Diagnoses and all orders for this  visit:  Bipolar affective disorder, current episode mixed, current episode severity unspecified (HCC) -     Oxcarbazepine (TRILEPTAL) 300 MG tablet; Take 1 tab po QHS x 7 days, then 1/2 tab po QHS x 7 days,  then stop.  Generalized anxiety disorder -     Oxcarbazepine (TRILEPTAL) 300 MG tablet; Take 1 tab po QHS x 7 days, then 1/2 tab po QHS x 7 days, then stop.  Attention deficit hyperactivity disorder (ADHD), predominantly hyperactive type -     amphetamine-dextroamphetamine (ADDERALL) 20 MG tablet; Take 1 tablet (20 mg total) by mouth 2 (two) times daily. -     amphetamine-dextroamphetamine (ADDERALL) 20 MG tablet; Take 1 tablet (20 mg total) by mouth 2 (two) times daily. -     amphetamine-dextroamphetamine (ADDERALL) 20 MG tablet; Take 1 tablet (20 mg total) by mouth 2 (two) times daily.    Please see After Visit Summary for patient specific instructions.  Future Appointments  Date Time Provider [place]  06/01/2019  8:30 AM Gillis Santa, MD [facility] None    No orders of the defined types were placed in this encounter.     -------------------------------

## 2019-05-15 ENCOUNTER — Other Ambulatory Visit: Payer: Self-pay | Admitting: Internal Medicine

## 2019-05-15 DIAGNOSIS — Z1231 Encounter for screening mammogram for malignant neoplasm of breast: Secondary | ICD-10-CM

## 2019-05-16 DIAGNOSIS — Z6834 Body mass index (BMI) 34.0-34.9, adult: Secondary | ICD-10-CM | POA: Diagnosis not present

## 2019-05-16 DIAGNOSIS — E669 Obesity, unspecified: Secondary | ICD-10-CM | POA: Diagnosis not present

## 2019-05-16 DIAGNOSIS — E6609 Other obesity due to excess calories: Secondary | ICD-10-CM | POA: Diagnosis not present

## 2019-06-01 ENCOUNTER — Encounter: Payer: Self-pay | Admitting: Student in an Organized Health Care Education/Training Program

## 2019-06-01 ENCOUNTER — Other Ambulatory Visit: Payer: Self-pay

## 2019-06-01 ENCOUNTER — Ambulatory Visit
Payer: Medicare HMO | Attending: Student in an Organized Health Care Education/Training Program | Admitting: Student in an Organized Health Care Education/Training Program

## 2019-06-01 DIAGNOSIS — G894 Chronic pain syndrome: Secondary | ICD-10-CM | POA: Diagnosis not present

## 2019-06-01 DIAGNOSIS — M542 Cervicalgia: Secondary | ICD-10-CM

## 2019-06-01 DIAGNOSIS — M797 Fibromyalgia: Secondary | ICD-10-CM | POA: Diagnosis not present

## 2019-06-01 DIAGNOSIS — M7918 Myalgia, other site: Secondary | ICD-10-CM | POA: Diagnosis not present

## 2019-06-01 MED ORDER — TRAMADOL HCL 50 MG PO TABS: 50.0000 mg | ORAL_TABLET | Freq: Four times a day (QID) | ORAL | 1 refills | Status: DC | PRN

## 2019-06-01 MED ORDER — BELBUCA 300 MCG BU FILM: 1.0000 | ORAL_FILM | Freq: Two times a day (BID) | BUCCAL | 1 refills | Status: DC

## 2019-06-01 NOTE — Progress Notes (Signed)
Pain Management Virtual Encounter Note - Virtual Visit via [place] (real-time audio visits between healthcare provider and patient).   Patient's Phone No. & Preferred Pharmacy:  [phone] (home); [phone] (mobile); (Preferred) [phone] [email or web address]  [place], [place] [address] Phone: [phone] Fax: [phone]  [facility] [phone] - [address] [place] [address] [place] [address] Phone: [phone] Fax: [facility] [place], [place] [address] Phone: [phone] Fax: [phone]    Pre-screening note:  Our staff contacted Gina Wallace and offered her an "in person", "face-to-face" appointment versus a telephone encounter. She indicated preferring the telephone encounter, at this time.   Reason for Virtual Visit: COVID-19*  Social distancing based on CDC and AMA recommendations.   I contacted Gina Wallace on 06/01/2019 via video conference.      I clearly identified myself as Gillis Santa, MD. I verified that I was speaking with the correct person using two identifiers (Name: Gina Wallace, and date of birth: 1956-01-07).  Advanced Informed Consent I sought verbal advanced consent from Gina Wallace for virtual visit interactions. I informed Gina Wallace of possible security and privacy concerns, risks, and limitations associated with providing "not-in-person" medical evaluation and management services. I also informed Gina Wallace of the availability of "in-person" appointments. Finally, I informed her that there would be a charge for the virtual visit and that she could be  personally, fully or partially, financially responsible for it. Gina Wallace expressed understanding and  agreed to proceed.   Historic Elements   Gina Wallace is a 63 y.o. year old, female patient evaluated today after her last encounter by our practice on 04/12/2019. Gina Wallace  has a past medical history of ADHD (attention deficit hyperactivity disorder), Anxiety, Bipolar disorder ([place]), Complication of anesthesia, Depression, Fibromyalgia, Hypertension, Macular degeneration, and Wears dentures. She also  has a past surgical history that includes Thyroidectomy (Right); Tonsillectomy and adenoidectomy; Cesarean section; Abdominal hysterectomy; Ablation; Anterior cruciate ligament repair (Right); Hallux valgus lapidus (Left, 02/02/2018); Weil osteotomy (Left, 02/02/2018); Hammer toe surgery (Left, 02/02/2018); and Breast biopsy (Bilateral). Gina Wallace has a current medication list which includes the following prescription(s): amphetamine-dextroamphetamine, apoaequorin, cannabidiol, lisinopril, lutein, phentermine, tizanidine, tramadol, trazodone, belbuca, lisinopril, multiple vitamins-minerals, and phendimetrazine tartrate. She  reports that she quit smoking about 11 years ago. Her smoking use included cigarettes. She has never used smokeless tobacco. She reports previous alcohol use. She reports that she does not use drugs. Gina Wallace has No Known Allergies.   HPI  Today, she is being contacted for medication management.  Patient's last visit with me was on 04/12/2019.  She states that her pain is moderately managed.  She was prescribed hydrocodone, 21 tablets that she used for pain flare.  She states that cold weather causes her musculoskeletal pain to amplify.  She states that she was out of hydrocodone approximately a week and a half ago.  She does utilize tramadol 50 to 100 mg twice daily as needed.  She states that it does help.  We discussed adding buprenorphine as a long-acting analgesic to help with her pain.  That way she can utilize tramadol 100 mg twice daily as needed for breakthrough  pain.  I will have her discontinue her hydrocodone.  Risks and benefits of  buprenorphine were discussed in great detail.  This medication has a safer side effect profile and has less risk of physical and psychological dependency.  Pharmacotherapy Assessment  Analgesic: 04/12/2019  2   04/12/2019  Tramadol Hcl 50 MG Tablet  120.00  30 Bi Lat   [id number]   Har ([id number])   0  20.00 MME  Medicare   [place]     Monitoring: Pharmacotherapy: No side-effects or adverse reactions reported. [place] PMP: PDMP reviewed during this encounter.       Compliance: No problems identified. Effectiveness: Clinically acceptable. Plan: Refer to "POC".  UDS:   Tricyclic, Ur Screen NONE DETECTED NONE DETECTED   Amphetamines, Ur Screen NONE DETECTED POSITIVE  (appropriately positive)  MDMA (Ecstasy)Ur Screen NONE DETECTED NONE DETECTED   Cocaine Metabolite,Ur [place] NONE DETECTED NONE DETECTED   Opiate, Ur Screen NONE DETECTED NONE DETECTED   Phencyclidine (PCP) Ur S NONE DETECTED NONE DETECTED   Cannabinoid 50 Ng, Ur [place] NONE DETECTED NONE DETECTED   Barbiturates, Ur Screen NONE DETECTED NONE DETECTED   Benzodiazepine, Ur Scrn NONE DETECTED NONE DETECTED   Methadone Scn, Ur NONE DETECTED NONE DETECTED   Comment: (NOTE)  Tricyclics + metabolites, urine  Cutoff 1000 ng/mL  Amphetamines + metabolites, urine Cutoff 1000 ng/mL  MDMA (Ecstasy), urine       Cutoff 500 ng/mL  Cocaine Metabolite, urine     Cutoff 300 ng/mL  Opiate + metabolites, urine    Cutoff 300 ng/mL  Phencyclidine (PCP), urine     Cutoff 25 ng/mL  Cannabinoid, urine         Cutoff 50 ng/mL  Barbiturates + metabolites, urine Cutoff 200 ng/mL  Benzodiazepine, urine       Cutoff 200 ng/mL  Methadone, urine          Cutoff 300 ng/mL  The urine drug screen provides only a preliminary, unconfirmed  analytical test result and should not be used for non-medical  purposes. Clinical consideration and professional  judgment should  be applied to any positive drug screen result due to possible  interfering substances. A more specific alternate chemical method  must be used in order to obtain a confirmed analytical result.  Gas chromatography / mass spectrometry (GC/MS) is the preferred  confirmatory method.  Performed at [facility], [address] [place],  [place] [address]      Assessment  The primary encounter diagnosis was Chronic pain syndrome. Diagnoses of Fibromyalgia, Cervicalgia, and Myofascial pain dysfunction syndrome were also pertinent to this visit.  Plan of Care  I have discontinued Gina Wallace's HYDROcodone-acetaminophen and Oxcarbazepine. I have also changed her traMADol. Additionally, I am having her start on Belbuca. Lastly, I am having her maintain her lisinopril, CANNABIDIOL PO, Multiple Vitamins-Minerals (VISION PLUS PO), Apoaequorin (PREVAGEN PO), tiZANidine, lisinopril, Phendimetrazine Tartrate, phentermine, traZODone, LUTEIN PO, and amphetamine-dextroamphetamine.  Pharmacotherapy (Medications Ordered): Meds ordered this encounter  Medications  . Buprenorphine HCl (BELBUCA) 300 MCG FILM    Sig: Place 1 Film inside cheek every 12 (twelve) hours.    Dispense:  60 each    Refill:  1  . traMADol (ULTRAM) 50 MG tablet    Sig: Take 1 tablet (50 mg total) by mouth every 6 (six) hours as needed for severe pain. For chronic pain. Each Rx must last 30 days.    Dispense:  120 tablet    Refill:  1   Orders:  No orders of the defined types were placed in this encounter.  Follow-up plan:  Return in about 8 weeks (around 07/27/2019) for Medication Management, virtual.     Recent Visits Date Type Provider Dept  04/12/19 Office Visit Gillis Santa, MD [facility]  Showing recent visits within past 90 days and meeting all other requirements   Today's Visits Date Type Provider Dept  06/01/19 Office Visit Gillis Santa, MD [facility]  Showing  today's visits and meeting all other requirements   Future Appointments No visits were found meeting these conditions.  Showing future appointments within next 90 days and meeting all other requirements   I discussed the assessment and treatment plan with the patient. The patient was provided an opportunity to ask questions and all were answered. The patient agreed with the plan and demonstrated an understanding of the instructions.  Patient advised to call back or seek an in-person evaluation if the symptoms or condition worsens.  Total duration of non-face-to-face encounter: 25 minutes.  Note by: Gillis Santa, MD Date: 06/01/2019; Time: 9:10 AM  Note: This dictation was prepared with Dragon dictation. Any transcriptional errors that may result from this process are unintentional.  Disclaimer:  * Given the special circumstances of the COVID-19 pandemic, the federal government has announced that the Office for Civil Rights (OCR) will exercise its enforcement discretion and will not impose penalties on physicians using telehealth in the event of noncompliance with regulatory requirements under the [place] (HIPAA) in connection with the good faith provision of telehealth during the XX123456 national public health emergency. ([place])

## 2019-06-27 ENCOUNTER — Telehealth: Payer: Self-pay

## 2019-06-27 NOTE — Telephone Encounter (Signed)
Next appt 07-27-19. Do you want to follow-up before then? Any other suggestions?

## 2019-06-27 NOTE — Telephone Encounter (Signed)
The patient called in to tell Dr. Holley Raring that the buprenorphine that she has been using for 3 weeks is not helping her breakthrough pain at all. She wants to know what to do about that.

## 2019-07-25 ENCOUNTER — Other Ambulatory Visit: Payer: Self-pay

## 2019-07-25 ENCOUNTER — Telehealth: Payer: Self-pay | Admitting: Psychiatry

## 2019-07-25 DIAGNOSIS — F901 Attention-deficit hyperactivity disorder, predominantly hyperactive type: Secondary | ICD-10-CM

## 2019-07-25 MED ORDER — AMPHETAMINE-DEXTROAMPHETAMINE 20 MG PO TABS: 20.0000 mg | ORAL_TABLET | Freq: Two times a day (BID) | ORAL | 0 refills | Status: DC

## 2019-07-25 NOTE — Telephone Encounter (Signed)
Pt made appt for 08/07/19. Would like a refill on her Adderall to be sent Kristopher Oppenheim on [address] in [place].

## 2019-07-25 NOTE — Telephone Encounter (Signed)
Last refill 06/21/2019 Apt 08/07/2019  Pended for Janett Billow to submit

## 2019-07-26 ENCOUNTER — Encounter: Payer: Self-pay | Admitting: Student in an Organized Health Care Education/Training Program

## 2019-07-26 ENCOUNTER — Telehealth: Payer: Self-pay | Admitting: *Deleted

## 2019-07-27 ENCOUNTER — Encounter: Payer: Self-pay | Admitting: Student in an Organized Health Care Education/Training Program

## 2019-07-27 ENCOUNTER — Other Ambulatory Visit: Payer: Self-pay

## 2019-07-27 ENCOUNTER — Ambulatory Visit
Payer: Medicare HMO | Attending: Student in an Organized Health Care Education/Training Program | Admitting: Student in an Organized Health Care Education/Training Program

## 2019-07-27 DIAGNOSIS — G894 Chronic pain syndrome: Secondary | ICD-10-CM

## 2019-07-27 DIAGNOSIS — M797 Fibromyalgia: Secondary | ICD-10-CM | POA: Diagnosis not present

## 2019-07-27 DIAGNOSIS — M7918 Myalgia, other site: Secondary | ICD-10-CM

## 2019-07-27 DIAGNOSIS — M542 Cervicalgia: Secondary | ICD-10-CM | POA: Diagnosis not present

## 2019-07-27 MED ORDER — HYDROCODONE-ACETAMINOPHEN 10-325 MG PO TABS: 1.0000 | ORAL_TABLET | Freq: Three times a day (TID) | ORAL | 0 refills | Status: DC | PRN

## 2019-07-27 MED ORDER — HYDROCODONE-ACETAMINOPHEN 10-325 MG PO TABS: 1.0000 | ORAL_TABLET | Freq: Three times a day (TID) | ORAL | 0 refills | Status: AC | PRN

## 2019-07-27 NOTE — Progress Notes (Signed)
Patient: Gina Wallace  Service Category: E/M  Provider: Gillis Santa, MD  DOB: Oct 27, 1955  DOS: 07/27/2019  Location: Office  MRN: [id number]  Setting: Ambulatory outpatient  Referring Provider: Ellene Wallace  Type: Established Patient  Specialty: Interventional Pain Management  PCP: Gina Wallace  Location: Home  Delivery: TeleHealth     Virtual Encounter - Pain Management PROVIDER NOTE: Information contained herein reflects review and annotations entered in association with encounter. Interpretation of such information and data should be left to medically-trained personnel. Information provided to patient can be located elsewhere in the medical record under "Patient Instructions". Document created using STT-dictation technology, any transcriptional errors that may result from process are unintentional.    Contact & Pharmacy Preferred: [phone] Home: [phone] (home) Mobile: [phone] (mobile) E-mail: [email or web address], [place] [address] Phone: [phone] Fax: [phone]  [facility] [phone] - [address], [place] [address] [place] [address] [place] [address] Phone: [phone] Fax: [facility] - [place], [place] [address] Phone: [phone] Fax: [phone]   Pre-screening  Gina Wallace offered "in-person" vs "virtual" encounter. She indicated preferring virtual for this encounter.   Reason COVID-19*  Social distancing based on CDC and AMA recommendations.   I contacted Gina Wallace on 07/27/2019 via telephone.      I clearly identified myself as Gina Santa, MD. I verified that I was speaking with the correct person using two identifiers (Name: Gina Wallace, and date of birth:  08-15-1955).  This visit was completed via telephone due to the restrictions of the COVID-19 pandemic. All issues as above were discussed and addressed but no physical exam was performed. If it was felt that the patient should be evaluated in the office, they were directed there. The patient verbally consented to this visit. Patient was unable to complete an audio/visual visit due to Technical difficulties and/or Lack of internet. Due to the catastrophic nature of the COVID-19 pandemic, this visit was done through audio contact only.  Location of the patient: home address (see Epic for details)  Location of the provider: office  Consent I sought verbal advanced consent from Gina Wallace for virtual visit interactions. I informed Gina Wallace of possible security and privacy concerns, risks, and limitations associated with providing "not-in-person" medical evaluation and management services. I also informed Gina Wallace of the availability of "in-person" appointments. Finally, I informed her that there would be a charge for the virtual visit and that she could be  personally, fully or partially, financially responsible for it. Gina Wallace expressed understanding and agreed to proceed.   Historic Elements   Gina Wallace is a 64 y.o. year old, female patient evaluated today after her last encounter by our practice on 07/26/2019. Gina Wallace  has a past medical history of ADHD (attention deficit hyperactivity disorder), Anxiety, Bipolar disorder ([place]), Complication of anesthesia, Depression, Fibromyalgia, Hypertension, Macular degeneration, and Wears dentures. She also  has a past surgical history that includes Thyroidectomy (Right); Tonsillectomy and adenoidectomy; Cesarean section; Abdominal hysterectomy; Ablation; Anterior cruciate ligament repair (Right); Hallux valgus lapidus (Left, 02/02/2018); Weil osteotomy (Left, 02/02/2018); Hammer toe surgery (Left, 02/02/2018); and Breast biopsy  (Bilateral). Gina Wallace has a current medication list which includes the following prescription(s): amphetamine-dextroamphetamine, apoaequorin, cannabidiol, lisinopril, lutein, phentermine, tizanidine, trazodone, hydrocodone-acetaminophen, [START ON  08/26/2019] hydrocodone-acetaminophen, lisinopril, multiple vitamins-minerals, and phendimetrazine tartrate. She  reports that she quit smoking about 11 years ago. Her smoking use included cigarettes. She has never used smokeless tobacco. She reports previous alcohol use. She reports that she does not use drugs. Gina Wallace has No Known Allergies.   HPI  Today, she is being contacted for medication management.   Diffuse MSK pain Had side effects to Belbuca, made her vomit. Tried 300 mcg BID Tramadol is not helping either, is taking 50 mg QID or 100 mg BID. She states that on some occasions she will take 200 mg at 1 time which were not "touch her pain". Patient states that her quality of life is deteriorating and that she is having more difficulty performing ADLs due to her pain. Of note she has failed gabapentin, Lyrica, Cymbalta in the past. Discussed hydrocodone, risks and benefits reviewed and we will trial that.   Pharmacotherapy Assessment  Analgesic: 06/03/2019  2   06/01/2019  Belbuca 300 MCG Film  60.00  30 Bi Lat   [id number]   Har ([id number])   0  0.60 mg  Medicare   [place]  06/01/2019  2   06/01/2019  Tramadol Hcl 50 MG Tablet  120.00  30 Bi Lat   [id number]   Har ([id number])   0  20.00 MME  Medicare   [place]     Monitoring: Pharmacotherapy: No side-effects or adverse reactions reported. [place] PMP: PDMP reviewed during this encounter.       Compliance: No problems identified. Effectiveness: Clinically acceptable. Plan: Refer to "POC".  Status:  Final result  Visible to patient:  No (not released)  Next appt:  None  Ref Range & Units 6 mo ago  Tricyclic, Ur Screen NONE DETECTED NONE DETECTED   Amphetamines, Ur Screen NONE DETECTED POSITIVEAbnormal     MDMA (Ecstasy)Ur Screen NONE DETECTED NONE DETECTED   Cocaine Metabolite,Ur [place] NONE DETECTED NONE DETECTED   Opiate, Ur Screen NONE DETECTED NONE DETECTED   Phencyclidine (PCP) Ur S NONE DETECTED NONE DETECTED   Cannabinoid 50 Ng, Ur [place] NONE DETECTED NONE DETECTED   Barbiturates, Ur Screen NONE DETECTED NONE DETECTED   Benzodiazepine, Ur Scrn NONE DETECTED NONE DETECTED   Methadone Scn, Ur NONE DETECTED NONE DETECTED   Comment: (NOTE)  Tricyclics + metabolites, urine  Cutoff 1000 ng/mL  Amphetamines + metabolites, urine Cutoff 1000 ng/mL  MDMA (Ecstasy), urine       Cutoff 500 ng/mL  Cocaine Metabolite, urine     Cutoff 300 ng/mL  Opiate + metabolites, urine    Cutoff 300 ng/mL  Phencyclidine (PCP), urine     Cutoff 25 ng/mL  Cannabinoid, urine         Cutoff 50 ng/mL  Barbiturates + metabolites, urine Cutoff 200 ng/mL  Benzodiazepine, urine       Cutoff 200 ng/mL  Methadone, urine          Cutoff 300 ng/mL  The urine drug screen provides only a preliminary, unconfirmed  analytical test result and should not be used for non-medical  purposes. Clinical consideration and professional judgment should  be applied to any positive drug screen result due to possible  interfering substances. A more specific alternate chemical method  must be used in order to obtain a confirmed analytical result.  Gas chromatography / mass spectrometry (GC/MS) is the preferred  confirmatory method.  Performed at [facility], [address], [place],  [place] [address]  Imaging  MM 3D SCREEN BREAST BILATERAL CLINICAL DATA:  Screening.  EXAM: DIGITAL SCREENING BILATERAL MAMMOGRAM WITH TOMO AND CAD  COMPARISON:  Previous exam(s). Please note, the time of interpretation, only a right-sided post clip mammogram from 2016 was available for comparison.  ACR Breast Density Category b: There are scattered areas of fibroglandular  density.  FINDINGS: There are no findings suspicious for malignancy. Images were processed with CAD.  IMPRESSION: No mammographic evidence of malignancy. A result letter of this screening mammogram will be mailed directly to the patient.  RECOMMENDATION: Screening mammogram in one year. (Code:[id number])  BI-RADS CATEGORY  1: Negative.  Electronically Signed   By: Kristopher Oppenheim M.D.   On: 06/21/2018 11:35   Assessment  The primary encounter diagnosis was Chronic pain syndrome. Diagnoses of Fibromyalgia, Cervicalgia, and Myofascial pain dysfunction syndrome were also pertinent to this visit.  Plan of Care  I have discontinued [facility] and traMADol. I am also having her start on HYDROcodone-acetaminophen and HYDROcodone-acetaminophen. Additionally, I am having her maintain her lisinopril, CANNABIDIOL PO, Multiple Vitamins-Minerals (VISION PLUS PO), Apoaequorin (PREVAGEN PO), tiZANidine, lisinopril, Phendimetrazine Tartrate, phentermine, traZODone, LUTEIN PO, and amphetamine-dextroamphetamine.  Pharmacotherapy (Medications Ordered): Meds ordered this encounter  Medications  . HYDROcodone-acetaminophen (NORCO) 10-325 MG tablet    Sig: Take 1 tablet by mouth every 8 (eight) hours as needed for severe pain. Must last 30 days.    Dispense:  90 tablet    Refill:  0    Chronic Pain. (STOP Act - Not applicable). Fill one day early if closed on scheduled refill date.  Marland Kitchen HYDROcodone-acetaminophen (NORCO) 10-325 MG tablet    Sig: Take 1 tablet by mouth every 8 (eight) hours as needed for severe pain. Must last 30 days.    Dispense:  90 tablet    Refill:  0    Chronic Pain. (STOP Act - Not applicable). Fill one day early if closed on scheduled refill date.  Follow-up plan:   Return in about 8 weeks (around 09/21/2019) for Medication Management, virtual.     Recent Visits Date Type Provider Dept  06/01/19 Office Visit Gina Santa, MD [facility]  Showing  recent visits within past 90 days and meeting all other requirements   Today's Visits Date Type Provider Dept  07/27/19 Office Visit Gina Santa, MD [facility]  Showing today's visits and meeting all other requirements   Future Appointments No visits were found meeting these conditions.  Showing future appointments within next 90 days and meeting all other requirements   I discussed the assessment and treatment plan with the patient. The patient was provided an opportunity to ask questions and all were answered. The patient agreed with the plan and demonstrated an understanding of the instructions.  Patient advised to call back or seek an in-person evaluation if the symptoms or condition worsens.  Total duration of non-face-to-face encounter: 30 minutes.  Note by: Gina Santa, MD Date: 07/27/2019; Time: 9:40 AM

## 2019-07-28 DIAGNOSIS — Z6835 Body mass index (BMI) 35.0-35.9, adult: Secondary | ICD-10-CM | POA: Diagnosis not present

## 2019-07-28 DIAGNOSIS — E6609 Other obesity due to excess calories: Secondary | ICD-10-CM | POA: Diagnosis not present

## 2019-07-28 DIAGNOSIS — E669 Obesity, unspecified: Secondary | ICD-10-CM | POA: Diagnosis not present

## 2019-08-07 ENCOUNTER — Encounter: Payer: Self-pay | Admitting: Psychiatry

## 2019-08-07 ENCOUNTER — Ambulatory Visit (INDEPENDENT_AMBULATORY_CARE_PROVIDER_SITE_OTHER): Payer: Medicare HMO | Admitting: Psychiatry

## 2019-08-07 VITALS — BP 111/78 | Ht 68.5 in | Wt 235.0 lb

## 2019-08-07 DIAGNOSIS — F901 Attention-deficit hyperactivity disorder, predominantly hyperactive type: Secondary | ICD-10-CM

## 2019-08-07 DIAGNOSIS — F411 Generalized anxiety disorder: Secondary | ICD-10-CM | POA: Diagnosis not present

## 2019-08-07 DIAGNOSIS — G4700 Insomnia, unspecified: Secondary | ICD-10-CM

## 2019-08-07 MED ORDER — TRAZODONE HCL 300 MG PO TABS: ORAL_TABLET | ORAL | 0 refills | Status: DC

## 2019-08-07 MED ORDER — AMPHETAMINE-DEXTROAMPHETAMINE 20 MG PO TABS: 20.0000 mg | ORAL_TABLET | Freq: Two times a day (BID) | ORAL | 0 refills | Status: DC

## 2019-08-07 MED ORDER — BUSPIRONE HCL 15 MG PO TABS: ORAL_TABLET | ORAL | 1 refills | Status: DC

## 2019-08-07 NOTE — Progress Notes (Signed)
Gina Wallace [id number] 02/28/1956 64 y.o.  Virtual Visit via Telephone Note  I connected with pt on 08/07/19 at  2:30 PM EST by telephone and verified that I am speaking with the correct person using two identifiers.   I discussed the limitations, risks, security and privacy concerns of performing an evaluation and management service by telephone and the availability of in person appointments. I also discussed with the patient that there may be a patient responsible charge related to this service. The patient expressed understanding and agreed to proceed.   I discussed the assessment and treatment plan with the patient. The patient was provided an opportunity to ask questions and all were answered. The patient agreed with the plan and demonstrated an understanding of the instructions.   The patient was advised to call back or seek an in-person evaluation if the symptoms worsen or if the condition fails to improve as anticipated.  I provided 25 minutes of non-face-to-face time during this encounter.  The patient was located at home.  The provider was located at [facility].   Thayer Headings, PMHNP   [name] is a 64 y.o. (DOB 09-19-1955) female.  Chief Complaint:  Chief Complaint  Patient presents with  . Anxiety  . ADD  . Follow-up    h/o depression  . Insomnia    HPI Gina Wallace presents for follow-up of depression, anxiety, and ADD. She reports that she is doing well over all. She reports that she has been seeing pain management since July and now has some improvement in pain control. She reports that pain relief has had a positive impact on her anxiety. She reports that her mood has been ok and "not having a lot of dark days." She reports that she has been sleeping well and sleep has significantly improved with weighted blanket. Reports that she occasionally needs to take Trazodone 450 mg po QHS for adequate sleep. She  reports that Adderall does not seem to be as effective. Has been seeing a wt loss provider and reports that wt has been fluctuating. Energy and motivation have been ok for the most part as long as she does not have any significant pain. Denies SI.   She reports that she has some anxiety on certain days, "like my insides are shaking." Pt asks about Buspar and reports that it has been effective for her family members.   Past Psychiatric Medication Trials: Reports h/o needing higher doses of medications since childhood Lithium- "I just don't want to take Lithium." Lamictal- denies adverse effects. May have been helpful. Trileptal Adderall  Wellbutrin XL- Has been effective. Cymbalta- Took x 3 months and had "severe reaction" Zoloft Effexor- Seemed to be effective Klonopin- Reports taking prn for anxiety a few times a week.  Trazodone- Has taken 150 mg in the past and recently taking 300 mg. Effective.  Ambien Vistaril- Ineffective Zyprexa- Reports that she took for only a week and stopped due to severe nightmares/lucid dreams  Review of Systems:  Review of Systems  Cardiovascular: Negative for palpitations.  Musculoskeletal: Positive for arthralgias and back pain. Negative for gait problem.  Neurological: Negative for tremors.  Psychiatric/Behavioral:       Please refer to HPI    Medications: I have reviewed the patient's current medications.  Current Outpatient Medications  Medication Sig Dispense Refill  . [START ON 08/23/2019] amphetamine-dextroamphetamine (ADDERALL) 20 MG tablet Take 1 tablet (20 mg total) by mouth 2 (two) times daily. 60 tablet  0  . Apoaequorin (PREVAGEN PO) Take by mouth.    Marland Kitchen CANNABIDIOL PO Take by mouth daily as needed.     Marland Kitchen HYDROcodone-acetaminophen (NORCO) 10-325 MG tablet Take 1 tablet by mouth every 8 (eight) hours as needed for severe pain. Must last 30 days. 90 tablet 0  . [START ON 08/26/2019] HYDROcodone-acetaminophen (NORCO) 10-325 MG tablet Take 1  tablet by mouth every 8 (eight) hours as needed for severe pain. Must last 30 days. 90 tablet 0  . lisinopril (ZESTRIL) 20 MG tablet Take 20 mg by mouth daily.     . LUTEIN PO Take by mouth daily.     . Multiple Vitamins-Minerals (VISION PLUS PO) Take by mouth.    . Naltrexone-buPROPion HCl ER (CONTRAVE) 8-90 MG TB12 1 tab po q day x 1 wk, 1 po BID x 1 wk, 2 tabs po q am and 1 tab po hs then 2 tabs BID from then on    . Phendimetrazine Tartrate 35 MG TABS 1 tab po TID    . tiZANidine (ZANAFLEX) 4 MG capsule Take 4 mg by mouth 3 (three) times daily as needed for muscle spasms.    . traZODone (DESYREL) 300 MG tablet Take 1-1.5 tabs po QHS 135 tablet 0  . busPIRone (BUSPAR) 15 MG tablet Take 1/3 tablet p.o. twice daily for 1 week, then take 2/3 tablet p.o. twice daily for 1 week, then take 1 tablet p.o. twice daily 60 tablet 1  . lisinopril (PRINIVIL,ZESTRIL) 5 MG tablet Take by mouth. Pt takes 4 tabs each AM     No current facility-administered medications for this visit.    Medication Side Effects: None  Allergies: No Known Allergies  Past Medical History:  Diagnosis Date  . ADHD (attention deficit hyperactivity disorder)   . Anxiety   . Bipolar disorder ([place])   . Complication of anesthesia    pt requires larger doses of meds for effect  . Depression   . Fibromyalgia   . Hypertension   . Macular degeneration   . Wears dentures    full upper and lower    Family History  Problem Relation Age of Onset  . Anxiety disorder Mother   . Depression Mother   . Breast cancer Mother 77  . Alcohol abuse Maternal Uncle   . Breast cancer Maternal Aunt   . Breast cancer Maternal Grandmother     Social History   Socioeconomic History  . Marital status: Married    Spouse name: Gina Wallace  . Number of children: 2  . Years of education: Not on file  . Highest education level: Associate degree: occupational, Hotel manager, or vocational program  Occupational History  . Not on file  Tobacco Use   . Smoking status: Former Smoker    Types: Cigarettes    Quit date: 12/14/2007    Years since quitting: 11.6  . Smokeless tobacco: Never Used  Substance and Sexual Activity  . Alcohol use: Not Currently    Comment: may have drink on Holidays  . Drug use: Never  . Sexual activity: Yes    Partners: Male    Birth control/protection: None  Other Topics Concern  . Not on file  Social History Narrative  . Not on file   Social Determinants of Health   Financial Resource Strain:   . Difficulty of Paying Living Expenses: Not on file  Food Insecurity:   . Worried About Charity fundraiser in the Last Year: Not on file  . Ran  Out of Food in the Last Year: Not on file  Transportation Needs:   . Lack of Transportation (Medical): Not on file  . Lack of Transportation (Non-Medical): Not on file  Physical Activity:   . Days of Exercise per Week: Not on file  . Minutes of Exercise per Session: Not on file  Stress:   . Feeling of Stress : Not on file  Social Connections:   . Frequency of Communication with Friends and Family: Not on file  . Frequency of Social Gatherings with Friends and Family: Not on file  . Attends Religious Services: Not on file  . Active Member of Clubs or Organizations: Not on file  . Attends Archivist Meetings: Not on file  . Marital Status: Not on file  Intimate Partner Violence:   . Fear of Current or Ex-Partner: Not on file  . Emotionally Abused: Not on file  . Physically Abused: Not on file  . Sexually Abused: Not on file    Past Medical History, Surgical history, Social history, and Family history were reviewed and updated as appropriate.   Please see review of systems for further details on the patient's review from today.   Objective:   Physical Exam:  BP 111/78   Ht 5' 8.5" (1.74 m)   Wt 235 lb (106.6 kg)   BMI 35.21 kg/m   Physical Exam Neurological:     Mental Status: She is alert and oriented to person, place, and time.      Cranial Nerves: No dysarthria.  Psychiatric:        Attention and Perception: Attention and perception normal.        Mood and Affect: Mood is anxious.        Speech: Speech normal.        Behavior: Behavior is cooperative.        Thought Content: Thought content normal. Thought content is not paranoid or delusional. Thought content does not include homicidal or suicidal ideation. Thought content does not include homicidal or suicidal plan.        Cognition and Memory: Cognition and memory normal.        Judgment: Judgment normal.     Comments: Insight intact     Lab Review:  No results found for: NA, K, CL, CO2, GLUCOSE, BUN, CREATININE, CALCIUM, PROT, ALBUMIN, AST, ALT, ALKPHOS, BILITOT, GFRNONAA, GFRAA  No results found for: WBC, RBC, HGB, HCT, PLT, MCV, MCH, MCHC, RDW, LYMPHSABS, MONOABS, EOSABS, BASOSABS  No results found for: POCLITH, LITHIUM   No results found for: PHENYTOIN, PHENOBARB, VALPROATE, CBMZ   .res Assessment: Plan:   Discussed potential benefits, risks, and side effects of Buspar for anxiety. Discussed that some studies indicate that Buspar may also improve concentration. Pt agrees to trial of Buspar. Start BuSpar 15 mg 1/3 tablet twice daily for 1 week, then increase to 2/3 tablet twice daily for 1 week, then increase to 1 tablet twice daily for anxiety. Will change Trazodone from 150 mg 1-2 tabs po QHS to 300 mg tabs 1-1.5 tabs po QHS since pt reports that some nights she needs 3 tabs to be able to fall asleep.  Continue Adderall 20 mg po BID for ADD. Pt to f/u in 6 weeks or sooner if clinically indicated.  Patient advised to contact office with any questions, adverse effects, or acute worsening in signs and symptoms.  Robyne was seen today for anxiety, add, follow-up and insomnia.  Diagnoses and all orders for this visit:  Generalized anxiety disorder -     busPIRone (BUSPAR) 15 MG tablet; Take 1/3 tablet p.o. twice daily for 1 week, then take 2/3 tablet p.o.  twice daily for 1 week, then take 1 tablet p.o. twice daily  Insomnia, unspecified type -     traZODone (DESYREL) 300 MG tablet; Take 1-1.5 tabs po QHS  Attention deficit hyperactivity disorder (ADHD), predominantly hyperactive type -     amphetamine-dextroamphetamine (ADDERALL) 20 MG tablet; Take 1 tablet (20 mg total) by mouth 2 (two) times daily.    Please see After Visit Summary for patient specific instructions.  Future Appointments  Date Time Provider [place]  09/21/2019  8:45 AM Gillis Santa, MD [facility] None    No orders of the defined types were placed in this encounter.     -------------------------------

## 2019-08-08 ENCOUNTER — Other Ambulatory Visit: Payer: Self-pay | Admitting: Psychiatry

## 2019-08-08 DIAGNOSIS — G4700 Insomnia, unspecified: Secondary | ICD-10-CM

## 2019-08-08 MED ORDER — TRAZODONE HCL 100 MG PO TABS: ORAL_TABLET | ORAL | 0 refills | Status: DC

## 2019-08-15 ENCOUNTER — Telehealth: Payer: Self-pay | Admitting: Psychiatry

## 2019-08-15 NOTE — Telephone Encounter (Signed)
Pharmacy is requesting medication directions clarification.

## 2019-08-16 NOTE — Telephone Encounter (Signed)
Return call to [facility], they were clarifying her trazodone Rx. They had already mailed patient her Trazodone 100 mg take 3-4 tablets Q hs, #360 on 08/09/2019, but also had a Rx for Trazodone 300 mg on file. Explained to them the 300 mg had been discontinued and replaced with 100 mg. They apologized for the mix up.

## 2019-08-17 DIAGNOSIS — I10 Essential (primary) hypertension: Secondary | ICD-10-CM | POA: Diagnosis not present

## 2019-08-17 DIAGNOSIS — F909 Attention-deficit hyperactivity disorder, unspecified type: Secondary | ICD-10-CM | POA: Diagnosis not present

## 2019-08-17 DIAGNOSIS — F325 Major depressive disorder, single episode, in full remission: Secondary | ICD-10-CM | POA: Diagnosis not present

## 2019-08-17 DIAGNOSIS — Z87891 Personal history of nicotine dependence: Secondary | ICD-10-CM | POA: Diagnosis not present

## 2019-08-24 ENCOUNTER — Telehealth: Payer: Self-pay | Admitting: Psychiatry

## 2019-08-24 ENCOUNTER — Other Ambulatory Visit: Payer: Self-pay

## 2019-08-24 DIAGNOSIS — F901 Attention-deficit hyperactivity disorder, predominantly hyperactive type: Secondary | ICD-10-CM

## 2019-08-24 MED ORDER — AMPHETAMINE-DEXTROAMPHETAMINE 20 MG PO TABS: 20.0000 mg | ORAL_TABLET | Freq: Two times a day (BID) | ORAL | 0 refills | Status: DC

## 2019-08-24 NOTE — Telephone Encounter (Signed)
Becky the pharmacist stated the Adderall 20mg  escribed on 1/25 by Thayer Headings NP can not be dispensed. According to [facility] regulations the supervising MD info is needed since this medication has not been prescribed previously.

## 2019-08-24 NOTE — Telephone Encounter (Signed)
Yes correct.

## 2019-09-20 ENCOUNTER — Encounter: Payer: Self-pay | Admitting: Student in an Organized Health Care Education/Training Program

## 2019-09-21 ENCOUNTER — Ambulatory Visit
Payer: Medicare HMO | Attending: Student in an Organized Health Care Education/Training Program | Admitting: Student in an Organized Health Care Education/Training Program

## 2019-09-21 ENCOUNTER — Encounter: Payer: Self-pay | Admitting: Student in an Organized Health Care Education/Training Program

## 2019-09-21 ENCOUNTER — Other Ambulatory Visit: Payer: Self-pay

## 2019-09-21 DIAGNOSIS — M7918 Myalgia, other site: Secondary | ICD-10-CM | POA: Diagnosis not present

## 2019-09-21 DIAGNOSIS — M797 Fibromyalgia: Secondary | ICD-10-CM

## 2019-09-21 DIAGNOSIS — G894 Chronic pain syndrome: Secondary | ICD-10-CM | POA: Diagnosis not present

## 2019-09-21 DIAGNOSIS — M542 Cervicalgia: Secondary | ICD-10-CM

## 2019-09-21 MED ORDER — HYDROCODONE-ACETAMINOPHEN 10-325 MG PO TABS: 1.0000 | ORAL_TABLET | Freq: Three times a day (TID) | ORAL | 0 refills | Status: DC | PRN

## 2019-09-21 MED ORDER — HYDROCODONE-ACETAMINOPHEN 10-325 MG PO TABS: 1.0000 | ORAL_TABLET | Freq: Four times a day (QID) | ORAL | 0 refills | Status: DC | PRN

## 2019-09-21 NOTE — Progress Notes (Signed)
Patient: Gina Wallace  Service Category: E/M  Provider: Gillis Santa, MD  DOB: [birth year]  DOS: 09/21/2019  Location: Office  MRN: [id number]  Setting: Ambulatory outpatient  Referring Provider: Ellene Route  Type: Established Patient  Specialty: Interventional Pain Management  PCP: Ellene Route  Location: Home  Delivery: TeleHealth     Virtual Encounter - Pain Management PROVIDER NOTE: Information contained herein reflects review and annotations entered in association with encounter. Interpretation of such information and data should be left to medically-trained personnel. Information provided to patient can be located elsewhere in the medical record under "Patient Instructions". Document created using STT-dictation technology, any transcriptional errors that may result from process are unintentional.    Contact & Pharmacy Preferred: [phone] Home: [phone] (home) Mobile: [phone] (mobile) E-mail: [email or web address], [place] [address] Phone: [phone] Fax: [phone]  [facility] [phone] - [address], [place] [address] [place] [address] Phone: [phone] Fax: [facility] [place], [place] [address] Phone: [phone] Fax: [phone]   Pre-screening  [name] offered "in-person" vs "virtual" encounter. She indicated preferring virtual for this encounter.   Reason COVID-19*  Social distancing based on CDC and AMA recommendations.   I contacted [name] on 09/21/2019 via telephone.      I clearly identified myself as Gillis Santa, MD. I verified that I was speaking with the correct person using two identifiers (Name: Berkley Wrightsman, and date of birth:  05-18-1956).  This visit was completed via telephone due to the restrictions of the COVID-19 pandemic. All issues as above were discussed and addressed but no physical exam was performed. If it was felt that the patient should be evaluated in the office, they were directed there. The patient verbally consented to this visit. Patient was unable to complete an audio/visual visit due to Technical difficulties and/or Lack of internet. Due to the catastrophic nature of the COVID-19 pandemic, this visit was done through audio contact only.  Location of the patient: home address (see Epic for details)  Location of the provider: office  Consent I sought verbal advanced consent from [name] for virtual visit interactions. I informed [name] of possible security and privacy concerns, risks, and limitations associated with providing "not-in-person" medical evaluation and management services. I also informed [name] of the availability of "in-person" appointments. Finally, I informed her that there would be a charge for the virtual visit and that she could be  personally, fully or partially, financially responsible for it. [name] expressed understanding and agreed to proceed.   Historic Elements   Ms. Zaylei Mullane is a 64 y.o. year old, female patient evaluated today after her last contact with our practice on 07/27/2019. [name]  has a past medical history of ADHD (attention deficit hyperactivity disorder), Anxiety, Bipolar disorder ([place]), Complication of anesthesia, Depression, Fibromyalgia, Hypertension, Macular degeneration, and Wears dentures. She also  has a past surgical history that includes Thyroidectomy (Right); Tonsillectomy and adenoidectomy; Cesarean section; Abdominal hysterectomy; Ablation; Anterior cruciate ligament repair (Right); Hallux valgus lapidus (Left, 02/02/2018); Weil osteotomy (Left, 02/02/2018); Hammer toe surgery (Left, 02/02/2018); and Breast biopsy  (Bilateral). [name] has a current medication list which includes the following prescription(s): amphetamine-dextroamphetamine, apoaequorin, buspirone, cannabidiol, hydrocodone-acetaminophen, [START ON 10/21/2019] hydrocodone-acetaminophen, lisinopril, lutein,  multiple vitamins-minerals, phentermine, tizanidine, trazodone, lisinopril, contrave, phendimetrazine tartrate, and tizanidine. She  reports that she quit smoking about 11 years ago. Her smoking use included cigarettes. She has never used smokeless tobacco. She reports previous alcohol use. She reports that she does not use drugs. [name] has No Known Allergies.   HPI  Today, she is being contacted for medication management.   Having a pain flare over the last week with her fibromyalgia, has been very severe Getting better pain relief with Hydrocodone however states that she has taken more than Rx'd on days when here pain was very severe She is currently out of her Hydrocodone Otherwise states that it is beneficial in helping to manage her pain when compared to [place] and Tramadol. Requesting temporary increase by 10 tablets/month during her fibromyalgia exacerbation  Pharmacotherapy Assessment  Analgesic: 08/29/2019  2   07/27/2019  Hydrocodone-Acetamin 10-325 MG  90.00  30 Bi Lat   [id number]   Har ([id number])   0  30.00 MME  Medicare   [place] [facility]     Monitoring: [place] PMP: PDMP reviewed during this encounter.       Pharmacotherapy: No side-effects or adverse reactions reported. Compliance: No problems identified. Effectiveness: Clinically acceptable. Plan: Refer to "POC".   Status:  Final result Visible to patient:  No (not released) Next appt:  None  01/05/2019  Ref Range & Units 8 mo ago  Tricyclic, Ur Screen NONE DETECTED NONE DETECTED   Amphetamines, Ur Screen NONE DETECTED POSITIVEAbnormal    MDMA (Ecstasy)Ur Screen NONE DETECTED NONE DETECTED   Cocaine Metabolite,Ur Amo NONE DETECTED NONE DETECTED   Opiate, Ur Screen NONE  DETECTED NONE DETECTED   Phencyclidine (PCP) Ur S NONE DETECTED NONE DETECTED   Cannabinoid 50 Ng, Ur [place] NONE DETECTED NONE DETECTED   Barbiturates, Ur Screen NONE DETECTED NONE DETECTED   Benzodiazepine, Ur Scrn NONE DETECTED NONE DETECTED   Methadone Scn, Ur NONE DETECTED NONE DETECTED   Comment: (NOTE)  Tricyclics + metabolites, urine  Cutoff 1000 ng/mL  Amphetamines + metabolites, urine Cutoff 1000 ng/mL  MDMA (Ecstasy), urine       Cutoff 500 ng/mL  Cocaine Metabolite, urine     Cutoff 300 ng/mL  Opiate + metabolites, urine    Cutoff 300 ng/mL  Phencyclidine (PCP), urine     Cutoff 25 ng/mL  Cannabinoid, urine         Cutoff 50 ng/mL  Barbiturates + metabolites, urine Cutoff 200 ng/mL  Benzodiazepine, urine       Cutoff 200 ng/mL  Methadone, urine          Cutoff 300 ng/mL  The urine drug screen provides only a preliminary, unconfirmed  analytical test result and should not be used for non-medical  purposes. Clinical consideration and professional judgment should  be applied to any positive drug screen result due to possible  interfering substances. A more specific alternate chemical method  must be used in order to obtain a confirmed analytical result.  Gas chromatography / mass spectrometry (GC/MS) is the preferred  confirmatory method.  Performed at [facility], [address], [place],   [address]          Laboratory Chemistry Profile   Renal No results found for: BUN, CREATININE, LABCREA, BCR, GFR, GFRAA, GFRNONAA, LABVMA, EPIRU, EPINEPH24HUR, NOREPRU, NOREPI24HUR, DOPARU, [id number]  Hepatic No results found for: AST, ALT, ALBUMIN, ALKPHOS, HCVAB, AMYLASE, LIPASE, AMMONIA  Electrolytes No results found for: NA, K, CL, CALCIUM, MG, PHOS  Bone  No results found for: VD25OH, [id number], [id number], [id number], 25OHVITD1, 25OHVITD2, 25OHVITD3, TESTOFREE, TESTOSTERONE  Inflammation (CRP: Acute Phase) (ESR:  Chronic Phase) No results found for: CRP, ESRSEDRATE, LATICACIDVEN    Note: Above Lab results reviewed.  Imaging  MM 3D SCREEN BREAST BILATERAL CLINICAL DATA:  Screening.  EXAM: DIGITAL SCREENING BILATERAL MAMMOGRAM WITH TOMO AND CAD  COMPARISON:  Previous exam(s). Please note, the time of interpretation, only a right-sided post clip mammogram from 2016 was available for comparison.  ACR Breast Density Category b: There are scattered areas of fibroglandular density.  FINDINGS: There are no findings suspicious for malignancy. Images were processed with CAD.  IMPRESSION: No mammographic evidence of malignancy. A result letter of this screening mammogram will be mailed directly to the patient.  RECOMMENDATION: Screening mammogram in one year. (Code:SM-B-01Y)  BI-RADS CATEGORY  1: Negative.  Electronically Signed   By: Kristopher Oppenheim M.D.   On: 06/21/2018 11:35  Assessment  The primary encounter diagnosis was Chronic pain syndrome. Diagnoses of Fibromyalgia, Cervicalgia, and Myofascial pain dysfunction syndrome were also pertinent to this visit.  Plan of Care  Ms. Tamilyn Lupien has a current medication list which includes the following long-term medication(s): amphetamine-dextroamphetamine, buspirone, lisinopril, trazodone, lisinopril, and phendimetrazine tartrate.  Pharmacotherapy (Medications Ordered): Meds ordered this encounter  Medications  . HYDROcodone-acetaminophen (NORCO) 10-325 MG tablet    Sig: Take 1 tablet by mouth every 6 (six) hours as needed for severe pain. Must last 30 days.    Dispense:  100 tablet    Refill:  0    Chronic Pain. (STOP Act - Not applicable). Fill one day early if closed on scheduled refill date.  Marland Kitchen HYDROcodone-acetaminophen (NORCO) 10-325 MG tablet    Sig: Take 1 tablet by mouth every 8 (eight) hours as needed for severe pain. Must last 30 days.    Dispense:  90 tablet    Refill:  0    Chronic Pain. (STOP Act - Not applicable).  Fill one day early if closed on scheduled refill date.   Follow-up plan:   No follow-ups on file.      Recent Visits Date Type Provider Dept  04/12/19 Office Visit Gillis Santa, MD [facility]  Showing recent visits within past 90 days and meeting all other requirements   Today's Visits Date Type Provider Dept  06/01/19 Office Visit Gillis Santa, MD [facility]  Showing today's visits and meeting all other requirements   Future Appointments No visits were found meeting these conditions.  Showing future appointments within next 90 days and meeting all other requirements   I discussed the assessment and treatment plan with the patient. The patient was provided an opportunity to ask questions and all were answered. The patient agreed with the plan and demonstrated an understanding of the instructions.  Patient advised to call back or seek an in-person evaluation if the symptoms or condition worsens.  Total duration of non-face-to-face encounter: 25 minutes.  Note by: Gillis Santa, MD Date: 06/01/2019; Time: 9:10 AM  Note: This dictation was prepared with Dragon dictation. Any transcriptional errors that may result from this process are unintentional.  Disclaimer:  * Given the special circumstances of the COVID-19 pandemic, the federal government has announced that the Office for Civil Rights (OCR) will exercise its enforcement discretion and will not impose penalties on physicians using telehealth in the event of noncompliance with regulatory requirements under the [place] (HIPAA) in connection with the good faith provision of telehealth  during the VQMGQ-67 national public health emergency. [facility])    Recent Visits Date Type Provider Dept  07/27/19 Office Visit Gillis Santa, MD [facility]  Showing recent visits within past 90 days and meeting all other requirements   Today's Visits Date Type Provider Dept   09/21/19 Office Visit Gillis Santa, MD [facility]  Showing today's visits and meeting all other requirements   Future Appointments No visits were found meeting these conditions.  Showing future appointments within next 90 days and meeting all other requirements   I discussed the assessment and treatment plan with the patient. The patient was provided an opportunity to ask questions and all were answered. The patient agreed with the plan and demonstrated an understanding of the instructions.  Patient advised to call back or seek an in-person evaluation if the symptoms or condition worsens.  Duration of encounter: 25 minutes.  Note by: Gillis Santa, MD Date: 09/21/2019; Time: 8:57 AM

## 2019-10-11 ENCOUNTER — Emergency Department (HOSPITAL_COMMUNITY)
Admission: EM | Admit: 2019-10-11 | Discharge: 2019-10-11 | Disposition: A | Discharge status: Home / Self Care | Payer: Medicare HMO | Attending: Emergency Medicine | Admitting: Emergency Medicine

## 2019-10-11 ENCOUNTER — Encounter (HOSPITAL_COMMUNITY): Payer: Self-pay | Admitting: Emergency Medicine

## 2019-10-11 ENCOUNTER — Other Ambulatory Visit: Payer: Self-pay

## 2019-10-11 ENCOUNTER — Emergency Department (HOSPITAL_COMMUNITY): Payer: Medicare HMO

## 2019-10-11 DIAGNOSIS — Y929 Unspecified place or not applicable: Secondary | ICD-10-CM | POA: Diagnosis not present

## 2019-10-11 DIAGNOSIS — S299XXA Unspecified injury of thorax, initial encounter: Secondary | ICD-10-CM | POA: Diagnosis not present

## 2019-10-11 DIAGNOSIS — S0291XA Unspecified fracture of skull, initial encounter for closed fracture: Secondary | ICD-10-CM | POA: Diagnosis not present

## 2019-10-11 DIAGNOSIS — S06360A Traumatic hemorrhage of cerebrum, unspecified, without loss of consciousness, initial encounter: Secondary | ICD-10-CM | POA: Diagnosis not present

## 2019-10-11 DIAGNOSIS — Y939 Activity, unspecified: Secondary | ICD-10-CM | POA: Diagnosis not present

## 2019-10-11 DIAGNOSIS — M25551 Pain in right hip: Secondary | ICD-10-CM | POA: Diagnosis not present

## 2019-10-11 DIAGNOSIS — I10 Essential (primary) hypertension: Secondary | ICD-10-CM | POA: Insufficient documentation

## 2019-10-11 DIAGNOSIS — S0102XA Laceration with foreign body of scalp, initial encounter: Secondary | ICD-10-CM | POA: Diagnosis not present

## 2019-10-11 DIAGNOSIS — Z87891 Personal history of nicotine dependence: Secondary | ICD-10-CM | POA: Diagnosis not present

## 2019-10-11 DIAGNOSIS — Z79899 Other long term (current) drug therapy: Secondary | ICD-10-CM | POA: Diagnosis not present

## 2019-10-11 DIAGNOSIS — S12110A Anterior displaced Type II dens fracture, initial encounter for closed fracture: Secondary | ICD-10-CM | POA: Diagnosis not present

## 2019-10-11 DIAGNOSIS — R519 Headache, unspecified: Secondary | ICD-10-CM | POA: Insufficient documentation

## 2019-10-11 DIAGNOSIS — Y999 Unspecified external cause status: Secondary | ICD-10-CM | POA: Diagnosis not present

## 2019-10-11 DIAGNOSIS — R52 Pain, unspecified: Secondary | ICD-10-CM | POA: Diagnosis not present

## 2019-10-11 DIAGNOSIS — R000 Tachycardia, unspecified: Secondary | ICD-10-CM | POA: Diagnosis not present

## 2019-10-11 DIAGNOSIS — S0101XA Laceration without foreign body of scalp, initial encounter: Secondary | ICD-10-CM | POA: Diagnosis not present

## 2019-10-11 DIAGNOSIS — R0902 Hypoxemia: Secondary | ICD-10-CM | POA: Diagnosis not present

## 2019-10-11 DIAGNOSIS — R58 Hemorrhage, not elsewhere classified: Secondary | ICD-10-CM | POA: Diagnosis not present

## 2019-10-11 MED ORDER — METHOCARBAMOL 500 MG PO TABS: 500.0000 mg | ORAL_TABLET | Freq: Two times a day (BID) | ORAL | 0 refills | Status: DC

## 2019-10-11 MED ORDER — HYDROMORPHONE HCL 1 MG/ML IJ SOLN
1.0000 mg | Freq: Once | INTRAMUSCULAR | Status: AC
  Administered 2019-10-11: 1 mg via INTRAVENOUS
  Filled 2019-10-11: qty 1

## 2019-10-11 MED ORDER — LIDOCAINE-EPINEPHRINE (PF) 2 %-1:200000 IJ SOLN
20.0000 mL | Freq: Once | INTRAMUSCULAR | Status: AC
  Administered 2019-10-11: 20 mL
  Filled 2019-10-11: qty 20

## 2019-10-11 MED ORDER — FENTANYL CITRATE (PF) 100 MCG/2ML IJ SOLN
50.0000 ug | Freq: Once | INTRAMUSCULAR | Status: AC
  Administered 2019-10-11: 50 ug via INTRAVENOUS
  Filled 2019-10-11: qty 2

## 2019-10-11 MED ORDER — METHOCARBAMOL 1000 MG/10ML IJ SOLN
1000.0000 mg | Freq: Once | INTRAMUSCULAR | Status: DC
  Filled 2019-10-11: qty 10

## 2019-10-11 MED ORDER — METHOCARBAMOL 1000 MG/10ML IJ SOLN
1000.0000 mg | Freq: Once | INTRAVENOUS | Status: AC
  Administered 2019-10-11: 1000 mg via INTRAVENOUS
  Filled 2019-10-11: qty 10
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
## 2019-10-11 NOTE — ED Notes (Signed)
Pt reports she requires additional pain meds, extra anesthesia when having surgery etc.  Pt tearful and hyperventilating.
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
## 2019-10-11 NOTE — Discharge Instructions (Addendum)
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 Take Robaxin as needed for pain and supplement with Tylenol and Ibuprofen as needed. Follow up with PCP for staple removal. Follow up with neurosurgery for your head and neck. Come back to the ER for worsening symptoms, vision changes, severe headache, problems urinating, problems walking, altered mental status.
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
## 2019-10-16 ENCOUNTER — Telehealth: Payer: Self-pay | Admitting: Student in an Organized Health Care Education/Training Program
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
## 2019-10-16 NOTE — Telephone Encounter (Signed)
I would say to add her to today's schedule so she can discuss whats going on with Dr Holley Raring

## 2019-10-18 ENCOUNTER — Other Ambulatory Visit: Payer: Self-pay

## 2019-10-18 ENCOUNTER — Telehealth: Payer: Self-pay | Admitting: *Deleted

## 2019-10-18 ENCOUNTER — Ambulatory Visit
Payer: Medicare HMO | Attending: Student in an Organized Health Care Education/Training Program | Admitting: Student in an Organized Health Care Education/Training Program

## 2019-10-18 ENCOUNTER — Encounter: Payer: Self-pay | Admitting: Student in an Organized Health Care Education/Training Program

## 2019-10-18 DIAGNOSIS — M542 Cervicalgia: Secondary | ICD-10-CM | POA: Diagnosis not present

## 2019-10-18 DIAGNOSIS — M797 Fibromyalgia: Secondary | ICD-10-CM | POA: Diagnosis not present

## 2019-10-18 DIAGNOSIS — S129XXD Fracture of neck, unspecified, subsequent encounter: Secondary | ICD-10-CM | POA: Diagnosis not present

## 2019-10-18 DIAGNOSIS — G894 Chronic pain syndrome: Secondary | ICD-10-CM

## 2019-10-18 DIAGNOSIS — M7918 Myalgia, other site: Secondary | ICD-10-CM | POA: Diagnosis not present

## 2019-10-18 DIAGNOSIS — Z4802 Encounter for removal of sutures: Secondary | ICD-10-CM | POA: Diagnosis not present

## 2019-10-18 DIAGNOSIS — Z87891 Personal history of nicotine dependence: Secondary | ICD-10-CM | POA: Diagnosis not present

## 2019-10-18 MED ORDER — OXYCODONE-ACETAMINOPHEN 10-325 MG PO TABS: 1.0000 | ORAL_TABLET | Freq: Three times a day (TID) | ORAL | 0 refills | Status: DC | PRN

## 2019-10-18 NOTE — Telephone Encounter (Signed)
Most recent prescription for Hydrocodone cancelled.
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
## 2019-10-19 DIAGNOSIS — R200 Anesthesia of skin: Secondary | ICD-10-CM | POA: Diagnosis not present

## 2019-10-19 DIAGNOSIS — R202 Paresthesia of skin: Secondary | ICD-10-CM | POA: Diagnosis not present

## 2019-10-19 DIAGNOSIS — G44321 Chronic post-traumatic headache, intractable: Secondary | ICD-10-CM | POA: Diagnosis not present

## 2019-10-19 DIAGNOSIS — F0781 Postconcussional syndrome: Secondary | ICD-10-CM | POA: Diagnosis not present

## 2019-10-20 DIAGNOSIS — F0781 Postconcussional syndrome: Secondary | ICD-10-CM | POA: Insufficient documentation

## 2019-10-20 DIAGNOSIS — G44321 Chronic post-traumatic headache, intractable: Secondary | ICD-10-CM | POA: Insufficient documentation

## 2019-11-02 ENCOUNTER — Ambulatory Visit (INDEPENDENT_AMBULATORY_CARE_PROVIDER_SITE_OTHER): Payer: Medicare HMO | Admitting: Psychiatry

## 2019-11-02 ENCOUNTER — Encounter: Payer: Self-pay | Admitting: Psychiatry

## 2019-11-02 DIAGNOSIS — F411 Generalized anxiety disorder: Secondary | ICD-10-CM | POA: Diagnosis not present

## 2019-11-02 DIAGNOSIS — F901 Attention-deficit hyperactivity disorder, predominantly hyperactive type: Secondary | ICD-10-CM

## 2019-11-02 DIAGNOSIS — G4700 Insomnia, unspecified: Secondary | ICD-10-CM

## 2019-11-02 MED ORDER — BUSPIRONE HCL 30 MG PO TABS: 30.0000 mg | ORAL_TABLET | Freq: Two times a day (BID) | ORAL | 0 refills | Status: DC

## 2019-11-02 MED ORDER — TRAZODONE HCL 100 MG PO TABS: ORAL_TABLET | ORAL | 0 refills | Status: DC

## 2019-11-02 MED ORDER — AMPHETAMINE-DEXTROAMPHETAMINE 20 MG PO TABS: 20.0000 mg | ORAL_TABLET | Freq: Two times a day (BID) | ORAL | 0 refills | Status: DC
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
## 2019-11-09 DIAGNOSIS — R202 Paresthesia of skin: Secondary | ICD-10-CM | POA: Diagnosis not present

## 2019-11-09 DIAGNOSIS — M542 Cervicalgia: Secondary | ICD-10-CM | POA: Diagnosis not present

## 2019-11-09 DIAGNOSIS — R200 Anesthesia of skin: Secondary | ICD-10-CM | POA: Diagnosis not present

## 2019-11-09 DIAGNOSIS — M79601 Pain in right arm: Secondary | ICD-10-CM | POA: Diagnosis not present

## 2019-11-10 ENCOUNTER — Telehealth: Payer: Self-pay
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
## 2019-11-13 ENCOUNTER — Other Ambulatory Visit: Payer: Self-pay

## 2019-11-13 ENCOUNTER — Encounter: Payer: Self-pay | Admitting: Student in an Organized Health Care Education/Training Program

## 2019-11-13 ENCOUNTER — Ambulatory Visit
Payer: Medicare HMO | Attending: Student in an Organized Health Care Education/Training Program | Admitting: Student in an Organized Health Care Education/Training Program

## 2019-11-13 DIAGNOSIS — M542 Cervicalgia: Secondary | ICD-10-CM | POA: Diagnosis not present

## 2019-11-13 DIAGNOSIS — G894 Chronic pain syndrome: Secondary | ICD-10-CM

## 2019-11-13 MED ORDER — OXYCODONE-ACETAMINOPHEN 10-325 MG PO TABS: 1.0000 | ORAL_TABLET | Freq: Three times a day (TID) | ORAL | 0 refills | Status: AC | PRN

## 2019-11-13 MED ORDER — OXYCODONE-ACETAMINOPHEN 10-325 MG PO TABS: 1.0000 | ORAL_TABLET | Freq: Three times a day (TID) | ORAL | 0 refills | Status: DC | PRN
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
## 2019-11-13 NOTE — Telephone Encounter (Signed)
Patient has been rschedule to 11-13-19 at 3:30 but Dr. Holley Raring told patient he would probably call earlier. Please do Nurse Chart call. Thank you. Patient has been notified of appt. change

## 2019-11-14 ENCOUNTER — Telehealth: Payer: Self-pay | Admitting: Student in an Organized Health Care Education/Training Program
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
## 2019-11-14 NOTE — Telephone Encounter (Signed)
Dr. Holley Raring reports there is a prescriptions at the pharmacy that is available to be filled. Patient notified.

## 2019-11-15 ENCOUNTER — Telehealth: Payer: Medicare HMO | Admitting: Student in an Organized Health Care Education/Training Program

## 2019-11-28 ENCOUNTER — Encounter: Payer: Medicare HMO | Admitting: Student in an Organized Health Care Education/Training Program

## 2019-12-06 ENCOUNTER — Telehealth: Payer: Self-pay | Admitting: Psychiatry

## 2019-12-06 ENCOUNTER — Other Ambulatory Visit: Payer: Self-pay

## 2019-12-06 DIAGNOSIS — F901 Attention-deficit hyperactivity disorder, predominantly hyperactive type: Secondary | ICD-10-CM

## 2019-12-06 NOTE — Telephone Encounter (Signed)
Gina Wallace called to request refill of her Adderall.  You only sent in 1 prescription from her appt. 4/22.  She only has 1 left.  She has not made appt for July yet because she has summer plans and needs to sort them out first.  Also, it should be noted that she said you sent in a prescription for her Trazodone to [facility], but the chart shows you sent to [facility].  Anyway, she sent back to meds to [facility] and called her PCP and had him prescribe the Trazodone at 300mg  1/day for a 30 day supply.  She was asking for refill of that but I told her there was a prescription at [facility] and she should use that prescription.  Again, it should be noted her PCP is prescribing too.

## 2019-12-07 DIAGNOSIS — F0781 Postconcussional syndrome: Secondary | ICD-10-CM | POA: Diagnosis not present

## 2019-12-07 DIAGNOSIS — G44321 Chronic post-traumatic headache, intractable: Secondary | ICD-10-CM | POA: Diagnosis not present

## 2019-12-07 DIAGNOSIS — M79601 Pain in right arm: Secondary | ICD-10-CM | POA: Diagnosis not present

## 2019-12-07 DIAGNOSIS — M542 Cervicalgia: Secondary | ICD-10-CM | POA: Diagnosis not present

## 2019-12-07 DIAGNOSIS — R202 Paresthesia of skin: Secondary | ICD-10-CM | POA: Diagnosis not present

## 2019-12-07 DIAGNOSIS — R200 Anesthesia of skin: Secondary | ICD-10-CM | POA: Diagnosis not present

## 2019-12-07 MED ORDER — AMPHETAMINE-DEXTROAMPHETAMINE 20 MG PO TABS: 20.0000 mg | ORAL_TABLET | Freq: Two times a day (BID) | ORAL | 0 refills | Status: DC

## 2019-12-08 ENCOUNTER — Other Ambulatory Visit: Payer: Self-pay | Admitting: Nurse Practitioner

## 2019-12-08 ENCOUNTER — Other Ambulatory Visit (HOSPITAL_COMMUNITY): Payer: Self-pay | Admitting: Nurse Practitioner

## 2019-12-08 DIAGNOSIS — R58 Hemorrhage, not elsewhere classified: Secondary | ICD-10-CM

## 2019-12-08 DIAGNOSIS — S12100A Unspecified displaced fracture of second cervical vertebra, initial encounter for closed fracture: Secondary | ICD-10-CM | POA: Diagnosis not present

## 2019-12-19 ENCOUNTER — Other Ambulatory Visit: Payer: Medicare HMO

## 2019-12-19 ENCOUNTER — Ambulatory Visit
Admission: RE | Admit: 2019-12-19 | Discharge: 2019-12-19 | Disposition: A | Discharge status: Home / Self Care | Payer: Medicare HMO | Source: Ambulatory Visit | Attending: Nurse Practitioner | Admitting: Nurse Practitioner

## 2019-12-19 DIAGNOSIS — X58XXXA Exposure to other specified factors, initial encounter: Secondary | ICD-10-CM | POA: Diagnosis not present

## 2019-12-19 DIAGNOSIS — S12100A Unspecified displaced fracture of second cervical vertebra, initial encounter for closed fracture: Secondary | ICD-10-CM | POA: Diagnosis not present

## 2019-12-19 DIAGNOSIS — S12111D Posterior displaced Type II dens fracture, subsequent encounter for fracture with routine healing: Secondary | ICD-10-CM | POA: Diagnosis not present

## 2019-12-19 DIAGNOSIS — M4802 Spinal stenosis, cervical region: Secondary | ICD-10-CM | POA: Insufficient documentation

## 2019-12-19 DIAGNOSIS — I6523 Occlusion and stenosis of bilateral carotid arteries: Secondary | ICD-10-CM | POA: Insufficient documentation

## 2019-12-19 DIAGNOSIS — S065X0D Traumatic subdural hemorrhage without loss of consciousness, subsequent encounter: Secondary | ICD-10-CM | POA: Diagnosis not present

## 2019-12-19 DIAGNOSIS — R58 Hemorrhage, not elsewhere classified: Secondary | ICD-10-CM | POA: Diagnosis not present

## 2019-12-20 ENCOUNTER — Ambulatory Visit: Payer: Medicare HMO

## 2019-12-21 ENCOUNTER — Telehealth: Payer: Self-pay | Admitting: Psychiatry

## 2019-12-21 NOTE — Telephone Encounter (Signed)
Patient called and said that  She needs to get back on her anti depressents.She said that you discussed at the her last visit that if she felt klike she needed to go on them again to let [place] know.
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
## 2019-12-25 ENCOUNTER — Encounter: Payer: Self-pay | Admitting: Psychiatry

## 2019-12-25 ENCOUNTER — Telehealth (INDEPENDENT_AMBULATORY_CARE_PROVIDER_SITE_OTHER): Payer: Medicare HMO | Admitting: Psychiatry

## 2019-12-25 VITALS — BP 100/70 | HR 85

## 2019-12-25 DIAGNOSIS — G4700 Insomnia, unspecified: Secondary | ICD-10-CM | POA: Diagnosis not present

## 2019-12-25 DIAGNOSIS — F411 Generalized anxiety disorder: Secondary | ICD-10-CM | POA: Diagnosis not present

## 2019-12-25 DIAGNOSIS — F901 Attention-deficit hyperactivity disorder, predominantly hyperactive type: Secondary | ICD-10-CM | POA: Diagnosis not present

## 2019-12-25 DIAGNOSIS — F3130 Bipolar disorder, current episode depressed, mild or moderate severity, unspecified: Secondary | ICD-10-CM

## 2019-12-25 MED ORDER — ARIPIPRAZOLE 10 MG PO TABS: 10.0000 mg | ORAL_TABLET | Freq: Every day | ORAL | 1 refills | Status: DC

## 2019-12-25 MED ORDER — AMPHETAMINE-DEXTROAMPHETAMINE 20 MG PO TABS: 20.0000 mg | ORAL_TABLET | Freq: Two times a day (BID) | ORAL | 0 refills | Status: DC

## 2019-12-25 MED ORDER — BUSPIRONE HCL 30 MG PO TABS: 30.0000 mg | ORAL_TABLET | Freq: Two times a day (BID) | ORAL | 0 refills | Status: DC

## 2019-12-25 MED ORDER — TRAZODONE HCL 150 MG PO TABS: ORAL_TABLET | ORAL | 1 refills | Status: DC
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
## 2020-01-03 ENCOUNTER — Encounter: Payer: Medicare HMO | Admitting: Student in an Organized Health Care Education/Training Program

## 2020-01-04 ENCOUNTER — Encounter: Payer: Self-pay | Admitting: Student in an Organized Health Care Education/Training Program

## 2020-01-04 ENCOUNTER — Ambulatory Visit
Payer: Medicare HMO | Attending: Student in an Organized Health Care Education/Training Program | Admitting: Student in an Organized Health Care Education/Training Program

## 2020-01-04 ENCOUNTER — Other Ambulatory Visit: Payer: Self-pay

## 2020-01-04 VITALS — BP 144/74 | HR 76 | Temp 98.4°F | Resp 18 | Ht 69.0 in | Wt 229.0 lb

## 2020-01-04 DIAGNOSIS — M797 Fibromyalgia: Secondary | ICD-10-CM

## 2020-01-04 DIAGNOSIS — S12100G Unspecified displaced fracture of second cervical vertebra, subsequent encounter for fracture with delayed healing: Secondary | ICD-10-CM | POA: Diagnosis not present

## 2020-01-04 DIAGNOSIS — M542 Cervicalgia: Secondary | ICD-10-CM | POA: Diagnosis not present

## 2020-01-04 DIAGNOSIS — G894 Chronic pain syndrome: Secondary | ICD-10-CM | POA: Insufficient documentation

## 2020-01-04 DIAGNOSIS — S12100A Unspecified displaced fracture of second cervical vertebra, initial encounter for closed fracture: Secondary | ICD-10-CM | POA: Insufficient documentation

## 2020-01-04 MED ORDER — OXYCODONE-ACETAMINOPHEN 10-325 MG PO TABS: 1.0000 | ORAL_TABLET | Freq: Three times a day (TID) | ORAL | 0 refills | Status: DC | PRN

## 2020-01-04 MED ORDER — OXYCODONE-ACETAMINOPHEN 10-325 MG PO TABS: 1.0000 | ORAL_TABLET | Freq: Three times a day (TID) | ORAL | 0 refills | Status: AC | PRN
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
## 2020-01-04 NOTE — Progress Notes (Signed)
Nursing Pain Medication Assessment:  Safety precautions to be maintained throughout the outpatient stay will include: orient to surroundings, keep bed in low position, maintain call bell within reach at all times, provide assistance with transfer out of bed and ambulation.  Medication Inspection Compliance: Gina Wallace did not comply with our request to bring her pills to be counted. She was reminded that bringing the medication bottles, even when empty, is a requirement.  Medication: See above Pill/Patch Count: No pills available to be counted. Pill/Patch Appearance: did not bring pills Bottle Appearance: No container available. Did not bring bottle(s) to appointment. Filled Date: 06/01/20212021 Last Medication intake:  Gina Wallace

## 2020-01-08 ENCOUNTER — Telehealth: Payer: Self-pay | Admitting: Student in an Organized Health Care Education/Training Program

## 2020-01-08 NOTE — Telephone Encounter (Signed)
Last filled on 12-13-19. The fill date on the script is today 01-08-20. She said you told her you were aware this is early. Please confirm this.

## 2020-01-08 NOTE — Telephone Encounter (Signed)
Patient calling stating pharmacy will not fill medications without a call from Dr. Elmon Else office saying she can get her script filled today. Please call pharmacy.

## 2020-01-09 NOTE — Telephone Encounter (Signed)
Patient is calling to check on her medication script, pharmacy is still not letting her fill this script dated for 01-08-20

## 2020-02-07 ENCOUNTER — Other Ambulatory Visit: Payer: Self-pay

## 2020-02-07 ENCOUNTER — Telehealth: Payer: Self-pay | Admitting: Psychiatry

## 2020-02-07 DIAGNOSIS — F901 Attention-deficit hyperactivity disorder, predominantly hyperactive type: Secondary | ICD-10-CM

## 2020-02-07 MED ORDER — AMPHETAMINE-DEXTROAMPHETAMINE 20 MG PO TABS: 20.0000 mg | ORAL_TABLET | Freq: Two times a day (BID) | ORAL | 0 refills | Status: DC

## 2020-02-07 NOTE — Telephone Encounter (Signed)
Pt would like a refill on Adderall 20mg . Pt wants to know if they can be called in before lunch. Please send to [facility] in [place].

## 2020-02-08 DIAGNOSIS — I10 Essential (primary) hypertension: Secondary | ICD-10-CM | POA: Diagnosis not present

## 2020-02-08 DIAGNOSIS — S12100D Unspecified displaced fracture of second cervical vertebra, subsequent encounter for fracture with routine healing: Secondary | ICD-10-CM | POA: Diagnosis not present

## 2020-02-08 DIAGNOSIS — M2578 Osteophyte, vertebrae: Secondary | ICD-10-CM | POA: Diagnosis not present

## 2020-02-08 DIAGNOSIS — M5382 Other specified dorsopathies, cervical region: Secondary | ICD-10-CM | POA: Diagnosis not present

## 2020-02-08 DIAGNOSIS — S12100A Unspecified displaced fracture of second cervical vertebra, initial encounter for closed fracture: Secondary | ICD-10-CM | POA: Diagnosis not present

## 2020-02-08 DIAGNOSIS — R222 Localized swelling, mass and lump, trunk: Secondary | ICD-10-CM | POA: Diagnosis not present

## 2020-02-09 ENCOUNTER — Telehealth: Payer: Self-pay | Admitting: Psychiatry

## 2020-02-09 NOTE — Telephone Encounter (Signed)
Noted thank you

## 2020-02-09 NOTE — Telephone Encounter (Signed)
Patient called and said that  The abilify is working and that she has been taking two to really help her. Her family can tell a difference and now she will be out in two days. So she is asking Janett Billow to send a new script of the abilify 2 tabs daily to the [facility] in [place]. Her next appt is 8/5

## 2020-02-09 NOTE — Telephone Encounter (Signed)
Pt called and said she has enough pills to get to her appointment so disregard the first message

## 2020-02-13 DIAGNOSIS — H35342 Macular cyst, hole, or pseudohole, left eye: Secondary | ICD-10-CM | POA: Diagnosis not present

## 2020-02-13 DIAGNOSIS — H353124 Nonexudative age-related macular degeneration, left eye, advanced atrophic with subfoveal involvement: Secondary | ICD-10-CM | POA: Diagnosis not present

## 2020-02-13 DIAGNOSIS — H35372 Puckering of macula, left eye: Secondary | ICD-10-CM | POA: Diagnosis not present

## 2020-02-13 DIAGNOSIS — H353113 Nonexudative age-related macular degeneration, right eye, advanced atrophic without subfoveal involvement: Secondary | ICD-10-CM | POA: Diagnosis not present

## 2020-02-15 ENCOUNTER — Other Ambulatory Visit: Payer: Self-pay

## 2020-02-15 ENCOUNTER — Ambulatory Visit (INDEPENDENT_AMBULATORY_CARE_PROVIDER_SITE_OTHER): Payer: Medicare HMO | Admitting: Psychiatry

## 2020-02-15 ENCOUNTER — Encounter: Payer: Self-pay | Admitting: Psychiatry

## 2020-02-15 VITALS — BP 136/81 | HR 82 | Wt 235.0 lb

## 2020-02-15 DIAGNOSIS — F3130 Bipolar disorder, current episode depressed, mild or moderate severity, unspecified: Secondary | ICD-10-CM

## 2020-02-15 DIAGNOSIS — F411 Generalized anxiety disorder: Secondary | ICD-10-CM | POA: Diagnosis not present

## 2020-02-15 DIAGNOSIS — G4700 Insomnia, unspecified: Secondary | ICD-10-CM | POA: Diagnosis not present

## 2020-02-15 DIAGNOSIS — F901 Attention-deficit hyperactivity disorder, predominantly hyperactive type: Secondary | ICD-10-CM | POA: Diagnosis not present

## 2020-02-15 MED ORDER — ARIPIPRAZOLE 15 MG PO TABS: 15.0000 mg | ORAL_TABLET | Freq: Every day | ORAL | 0 refills | Status: DC

## 2020-02-15 MED ORDER — AMPHETAMINE-DEXTROAMPHETAMINE 30 MG PO TABS: 30.0000 mg | ORAL_TABLET | Freq: Two times a day (BID) | ORAL | 0 refills | Status: DC

## 2020-02-15 MED ORDER — DAYVIGO 5 MG PO TABS: 5.0000 mg | ORAL_TABLET | Freq: Every day | ORAL | 0 refills | Status: DC

## 2020-02-15 MED ORDER — BUSPIRONE HCL 30 MG PO TABS: 30.0000 mg | ORAL_TABLET | Freq: Two times a day (BID) | ORAL | 0 refills | Status: DC

## 2020-02-15 NOTE — Progress Notes (Signed)
°   02/15/20 1140  Facial and Oral Movements  Muscles of Facial Expression 0  Lips and Perioral Area 0  Jaw 0  Tongue 0  Extremity Movements  Upper (arms, wrists, hands, fingers) 0  Lower (legs, knees, ankles, toes) 0  Trunk Movements  Neck, shoulders, hips 0  Overall Severity  Severity of abnormal movements (highest score from questions above) 0  Incapacitation due to abnormal movements 0  Patient's awareness of abnormal movements (rate only patient's report) 0  AIMS Total Score  AIMS Total Score 0
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
## 2020-02-28 DIAGNOSIS — M542 Cervicalgia: Secondary | ICD-10-CM | POA: Diagnosis not present

## 2020-03-01 ENCOUNTER — Telehealth: Payer: Self-pay | Admitting: Psychiatry

## 2020-03-01 DIAGNOSIS — G4700 Insomnia, unspecified: Secondary | ICD-10-CM

## 2020-03-01 MED ORDER — TRAZODONE HCL 150 MG PO TABS: ORAL_TABLET | ORAL | 1 refills | Status: DC

## 2020-03-01 NOTE — Telephone Encounter (Signed)
Pt tried sleep meds and did not work. Requesting refill for Trazodone 150 mg 3 @ bed time. 90 day supply @ [facility]. Apt 11/5

## 2020-03-04 DIAGNOSIS — Z1231 Encounter for screening mammogram for malignant neoplasm of breast: Secondary | ICD-10-CM | POA: Diagnosis not present

## 2020-03-04 DIAGNOSIS — F325 Major depressive disorder, single episode, in full remission: Secondary | ICD-10-CM | POA: Diagnosis not present

## 2020-03-04 DIAGNOSIS — Z0000 Encounter for general adult medical examination without abnormal findings: Secondary | ICD-10-CM | POA: Diagnosis not present

## 2020-03-04 DIAGNOSIS — I10 Essential (primary) hypertension: Secondary | ICD-10-CM | POA: Diagnosis not present

## 2020-03-04 DIAGNOSIS — M2550 Pain in unspecified joint: Secondary | ICD-10-CM | POA: Diagnosis not present

## 2020-03-04 DIAGNOSIS — E669 Obesity, unspecified: Secondary | ICD-10-CM | POA: Diagnosis not present

## 2020-03-05 DIAGNOSIS — M5382 Other specified dorsopathies, cervical region: Secondary | ICD-10-CM | POA: Diagnosis not present

## 2020-04-02 ENCOUNTER — Ambulatory Visit
Payer: Medicare HMO | Attending: Student in an Organized Health Care Education/Training Program | Admitting: Student in an Organized Health Care Education/Training Program

## 2020-04-02 ENCOUNTER — Encounter: Payer: Self-pay | Admitting: Student in an Organized Health Care Education/Training Program

## 2020-04-02 ENCOUNTER — Other Ambulatory Visit: Payer: Self-pay

## 2020-04-02 VITALS — BP 122/71 | HR 88 | Temp 98.1°F | Resp 18 | Ht 69.0 in | Wt 232.0 lb

## 2020-04-02 DIAGNOSIS — Y92410 Unspecified street and highway as the place of occurrence of the external cause: Secondary | ICD-10-CM | POA: Insufficient documentation

## 2020-04-02 DIAGNOSIS — I10 Essential (primary) hypertension: Secondary | ICD-10-CM | POA: Insufficient documentation

## 2020-04-02 DIAGNOSIS — S12110A Anterior displaced Type II dens fracture, initial encounter for closed fracture: Secondary | ICD-10-CM | POA: Diagnosis not present

## 2020-04-02 DIAGNOSIS — I6200 Nontraumatic subdural hemorrhage, unspecified: Secondary | ICD-10-CM | POA: Diagnosis not present

## 2020-04-02 DIAGNOSIS — Z7901 Long term (current) use of anticoagulants: Secondary | ICD-10-CM | POA: Diagnosis not present

## 2020-04-02 DIAGNOSIS — S12100G Unspecified displaced fracture of second cervical vertebra, subsequent encounter for fracture with delayed healing: Secondary | ICD-10-CM

## 2020-04-02 DIAGNOSIS — Z79899 Other long term (current) drug therapy: Secondary | ICD-10-CM | POA: Diagnosis not present

## 2020-04-02 DIAGNOSIS — G894 Chronic pain syndrome: Secondary | ICD-10-CM

## 2020-04-02 DIAGNOSIS — M7918 Myalgia, other site: Secondary | ICD-10-CM | POA: Diagnosis not present

## 2020-04-02 DIAGNOSIS — Z87891 Personal history of nicotine dependence: Secondary | ICD-10-CM | POA: Insufficient documentation

## 2020-04-02 DIAGNOSIS — M542 Cervicalgia: Secondary | ICD-10-CM

## 2020-04-02 DIAGNOSIS — M797 Fibromyalgia: Secondary | ICD-10-CM | POA: Diagnosis not present

## 2020-04-02 MED ORDER — OXYCODONE-ACETAMINOPHEN 10-325 MG PO TABS
1.0000 | ORAL_TABLET | Freq: Three times a day (TID) | ORAL | 0 refills | Status: DC | PRN
Start: 2020-05-06 — End: 2020-05-14

## 2020-04-02 MED ORDER — OXYCODONE-ACETAMINOPHEN 10-325 MG PO TABS: 1.0000 | ORAL_TABLET | Freq: Three times a day (TID) | ORAL | 0 refills | Status: AC | PRN

## 2020-04-02 MED ORDER — BUPRENORPHINE 5 MCG/HR TD PTWK
1.0000 | MEDICATED_PATCH | TRANSDERMAL | 1 refills | Status: DC
Start: 2020-04-02 — End: 2020-05-14
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
## 2020-04-02 NOTE — Progress Notes (Signed)
Nursing Pain Medication Assessment:  Safety precautions to be maintained throughout the outpatient stay will include: orient to surroundings, keep bed in low position, maintain call bell within reach at all times, provide assistance with transfer out of bed and ambulation.  Medication Inspection Compliance: Pill count conducted under aseptic conditions, in front of the patient. Neither the pills nor the bottle was removed from the patient's sight at any time. Once count was completed pills were immediately returned to the patient in their original bottle.  Medication: Oxycodone IR Pill/Patch Count: 13 of 90 pills remain Pill/Patch Appearance: Markings consistent with prescribed medication Bottle Appearance: Standard pharmacy container. Clearly labeled. Filled Date: 08 / 27 / 2021 Last Medication intake:  Today

## 2020-04-04 LAB — TOXASSURE SELECT 13 (MW), URINE

## 2020-04-11 DIAGNOSIS — H25812 Combined forms of age-related cataract, left eye: Secondary | ICD-10-CM | POA: Diagnosis not present

## 2020-04-11 DIAGNOSIS — H25811 Combined forms of age-related cataract, right eye: Secondary | ICD-10-CM | POA: Diagnosis not present

## 2020-04-11 DIAGNOSIS — H25813 Combined forms of age-related cataract, bilateral: Secondary | ICD-10-CM | POA: Diagnosis not present

## 2020-04-23 DIAGNOSIS — M791 Myalgia, unspecified site: Secondary | ICD-10-CM | POA: Diagnosis not present

## 2020-04-23 DIAGNOSIS — M8949 Other hypertrophic osteoarthropathy, multiple sites: Secondary | ICD-10-CM | POA: Diagnosis not present

## 2020-04-23 DIAGNOSIS — M79642 Pain in left hand: Secondary | ICD-10-CM | POA: Diagnosis not present

## 2020-04-23 DIAGNOSIS — M79641 Pain in right hand: Secondary | ICD-10-CM | POA: Diagnosis not present

## 2020-05-01 ENCOUNTER — Telehealth: Payer: Self-pay | Admitting: Psychiatry

## 2020-05-01 ENCOUNTER — Other Ambulatory Visit: Payer: Self-pay

## 2020-05-01 DIAGNOSIS — G4700 Insomnia, unspecified: Secondary | ICD-10-CM

## 2020-05-01 MED ORDER — TRAZODONE HCL 150 MG PO TABS: ORAL_TABLET | ORAL | 1 refills | Status: DC

## 2020-05-01 NOTE — Telephone Encounter (Signed)
I'm not sure if that's something you want to adjust while Janett Billow is out? Not sure coverage on quantity.

## 2020-05-01 NOTE — Telephone Encounter (Signed)
Addendum to previous telephone message today. Patient forgot to ask for Trazadone refill to be called in to [facility] in [place].

## 2020-05-01 NOTE — Telephone Encounter (Signed)
Needs to wait for provider to address this request

## 2020-05-01 NOTE — Telephone Encounter (Signed)
Trazodone Rx sent

## 2020-05-01 NOTE — Telephone Encounter (Signed)
Needs her Adderall sent in today, but wants to change the dosing. ( There is one on file for Oct already). She has been on 30mg  but sometimes does not take the full 2 pills per day. The 30mg  are difficult to cut in half. She'd like the RX to be sent in for 20mg  for 3 day- then she can take 2 or 3 per day depending on need. Please call if there are questions.  To [facility] in [place], [place]

## 2020-05-02 NOTE — Telephone Encounter (Signed)
Discussed with patient that Gina Wallace would need to review before making any changes. She understood, she was hoping it could be addressed upon her returning so she can fill it on Monday. Informed her I would give her the message and she would address as soon as she could but obviously lots of messages when she returns.

## 2020-05-06 NOTE — Telephone Encounter (Signed)
It looks like Adderall 30 mg was filled on 05/03/20 per PDMP. Will discuss change for next month at apt on 05/17/20.

## 2020-05-10 ENCOUNTER — Encounter: Payer: Self-pay | Admitting: Occupational Therapy

## 2020-05-10 ENCOUNTER — Other Ambulatory Visit: Payer: Self-pay

## 2020-05-10 ENCOUNTER — Ambulatory Visit: Payer: Medicare HMO | Attending: Rheumatology | Admitting: Occupational Therapy

## 2020-05-10 DIAGNOSIS — R600 Localized edema: Secondary | ICD-10-CM | POA: Insufficient documentation

## 2020-05-10 DIAGNOSIS — M79641 Pain in right hand: Secondary | ICD-10-CM | POA: Diagnosis not present

## 2020-05-10 DIAGNOSIS — M25641 Stiffness of right hand, not elsewhere classified: Secondary | ICD-10-CM | POA: Insufficient documentation
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
## 2020-05-14 ENCOUNTER — Ambulatory Visit
Payer: Medicare HMO | Attending: Student in an Organized Health Care Education/Training Program | Admitting: Student in an Organized Health Care Education/Training Program

## 2020-05-14 ENCOUNTER — Encounter: Payer: Self-pay | Admitting: Student in an Organized Health Care Education/Training Program

## 2020-05-14 ENCOUNTER — Other Ambulatory Visit: Payer: Self-pay

## 2020-05-14 VITALS — BP 151/88 | HR 84 | Temp 97.4°F | Resp 18 | Ht 69.0 in | Wt 235.0 lb

## 2020-05-14 DIAGNOSIS — M79603 Pain in arm, unspecified: Secondary | ICD-10-CM | POA: Diagnosis not present

## 2020-05-14 DIAGNOSIS — Z87891 Personal history of nicotine dependence: Secondary | ICD-10-CM | POA: Insufficient documentation

## 2020-05-14 DIAGNOSIS — G894 Chronic pain syndrome: Secondary | ICD-10-CM | POA: Insufficient documentation

## 2020-05-14 DIAGNOSIS — S12100G Unspecified displaced fracture of second cervical vertebra, subsequent encounter for fracture with delayed healing: Secondary | ICD-10-CM | POA: Diagnosis not present

## 2020-05-14 DIAGNOSIS — M7918 Myalgia, other site: Secondary | ICD-10-CM | POA: Diagnosis not present

## 2020-05-14 DIAGNOSIS — M542 Cervicalgia: Secondary | ICD-10-CM | POA: Insufficient documentation

## 2020-05-14 DIAGNOSIS — Y92410 Unspecified street and highway as the place of occurrence of the external cause: Secondary | ICD-10-CM | POA: Insufficient documentation

## 2020-05-14 DIAGNOSIS — M797 Fibromyalgia: Secondary | ICD-10-CM

## 2020-05-14 DIAGNOSIS — Z79899 Other long term (current) drug therapy: Secondary | ICD-10-CM | POA: Insufficient documentation

## 2020-05-14 DIAGNOSIS — I6200 Nontraumatic subdural hemorrhage, unspecified: Secondary | ICD-10-CM | POA: Insufficient documentation

## 2020-05-14 DIAGNOSIS — M79606 Pain in leg, unspecified: Secondary | ICD-10-CM | POA: Insufficient documentation

## 2020-05-14 MED ORDER — OXYCODONE-ACETAMINOPHEN 7.5-325 MG PO TABS: 1.0000 | ORAL_TABLET | Freq: Three times a day (TID) | ORAL | 0 refills | Status: DC | PRN

## 2020-05-14 MED ORDER — BUPRENORPHINE 10 MCG/HR TD PTWK: 1.0000 | MEDICATED_PATCH | TRANSDERMAL | 2 refills | Status: DC

## 2020-05-14 MED ORDER — OXYCODONE-ACETAMINOPHEN 7.5-325 MG PO TABS: 1.0000 | ORAL_TABLET | Freq: Three times a day (TID) | ORAL | 0 refills | Status: AC | PRN

## 2020-05-14 NOTE — Progress Notes (Signed)
Nursing Pain Medication Assessment:  Safety precautions to be maintained throughout the outpatient stay will include: orient to surroundings, keep bed in low position, maintain call bell within reach at all times, provide assistance with transfer out of bed and ambulation.  Medication Inspection Compliance: Pill count conducted under aseptic conditions, in front of the patient. Neither the pills nor the bottle was removed from the patient's sight at any time. Once count was completed pills were immediately returned to the patient in their original bottle.  Medication: Oxycodone/APAP Pill/Patch Count: 80 of 90 pills remain Pill/Patch Appearance: Markings consistent with prescribed medication Bottle Appearance: Standard pharmacy container. Clearly labeled. Filled Date:05/11/2020  Last Medication intake:  Yesterday
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
## 2020-05-15 DIAGNOSIS — F419 Anxiety disorder, unspecified: Secondary | ICD-10-CM | POA: Diagnosis not present

## 2020-05-15 DIAGNOSIS — Z6835 Body mass index (BMI) 35.0-35.9, adult: Secondary | ICD-10-CM | POA: Diagnosis not present

## 2020-05-15 DIAGNOSIS — E669 Obesity, unspecified: Secondary | ICD-10-CM | POA: Diagnosis not present

## 2020-05-15 DIAGNOSIS — I10 Essential (primary) hypertension: Secondary | ICD-10-CM | POA: Diagnosis not present

## 2020-05-15 DIAGNOSIS — H25812 Combined forms of age-related cataract, left eye: Secondary | ICD-10-CM | POA: Diagnosis not present

## 2020-05-15 DIAGNOSIS — Z87891 Personal history of nicotine dependence: Secondary | ICD-10-CM | POA: Diagnosis not present

## 2020-05-15 DIAGNOSIS — G8929 Other chronic pain: Secondary | ICD-10-CM | POA: Diagnosis not present

## 2020-05-15 DIAGNOSIS — M1990 Unspecified osteoarthritis, unspecified site: Secondary | ICD-10-CM | POA: Diagnosis not present

## 2020-05-17 ENCOUNTER — Telehealth (INDEPENDENT_AMBULATORY_CARE_PROVIDER_SITE_OTHER): Payer: Medicare HMO | Admitting: Psychiatry

## 2020-05-17 ENCOUNTER — Ambulatory Visit: Payer: Medicare HMO | Admitting: Psychiatry

## 2020-05-17 ENCOUNTER — Encounter: Payer: Self-pay | Admitting: Psychiatry

## 2020-05-17 DIAGNOSIS — F3130 Bipolar disorder, current episode depressed, mild or moderate severity, unspecified: Secondary | ICD-10-CM

## 2020-05-17 DIAGNOSIS — F901 Attention-deficit hyperactivity disorder, predominantly hyperactive type: Secondary | ICD-10-CM

## 2020-05-17 MED ORDER — ARIPIPRAZOLE 20 MG PO TABS: 20.0000 mg | ORAL_TABLET | Freq: Every day | ORAL | 1 refills | Status: DC

## 2020-05-17 MED ORDER — AMPHETAMINE-DEXTROAMPHETAMINE 20 MG PO TABS: 20.0000 mg | ORAL_TABLET | Freq: Three times a day (TID) | ORAL | 0 refills | Status: DC
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
## 2020-05-20 ENCOUNTER — Ambulatory Visit: Payer: Medicare HMO | Attending: Rheumatology | Admitting: Occupational Therapy

## 2020-05-21 ENCOUNTER — Telehealth: Payer: Self-pay | Admitting: Student in an Organized Health Care Education/Training Program

## 2020-05-21 NOTE — Telephone Encounter (Signed)
error 

## 2020-06-03 DIAGNOSIS — M797 Fibromyalgia: Secondary | ICD-10-CM | POA: Diagnosis not present

## 2020-06-03 DIAGNOSIS — Z87891 Personal history of nicotine dependence: Secondary | ICD-10-CM | POA: Diagnosis not present

## 2020-06-03 DIAGNOSIS — Z79899 Other long term (current) drug therapy: Secondary | ICD-10-CM | POA: Diagnosis not present

## 2020-06-03 DIAGNOSIS — H25811 Combined forms of age-related cataract, right eye: Secondary | ICD-10-CM | POA: Diagnosis not present

## 2020-06-03 DIAGNOSIS — E669 Obesity, unspecified: Secondary | ICD-10-CM | POA: Diagnosis not present

## 2020-06-03 DIAGNOSIS — Z791 Long term (current) use of non-steroidal anti-inflammatories (NSAID): Secondary | ICD-10-CM | POA: Diagnosis not present

## 2020-06-03 DIAGNOSIS — M1990 Unspecified osteoarthritis, unspecified site: Secondary | ICD-10-CM | POA: Diagnosis not present

## 2020-06-03 DIAGNOSIS — R748 Abnormal levels of other serum enzymes: Secondary | ICD-10-CM | POA: Diagnosis not present

## 2020-06-03 DIAGNOSIS — I10 Essential (primary) hypertension: Secondary | ICD-10-CM | POA: Diagnosis not present

## 2020-06-04 DIAGNOSIS — Z6834 Body mass index (BMI) 34.0-34.9, adult: Secondary | ICD-10-CM | POA: Diagnosis not present

## 2020-06-04 DIAGNOSIS — E669 Obesity, unspecified: Secondary | ICD-10-CM | POA: Diagnosis not present

## 2020-06-21 ENCOUNTER — Ambulatory Visit: Payer: Medicare HMO | Admitting: Psychiatry

## 2020-07-16 ENCOUNTER — Telehealth: Payer: Self-pay | Admitting: Psychiatry

## 2020-07-16 ENCOUNTER — Other Ambulatory Visit: Payer: Self-pay | Admitting: Psychiatry

## 2020-07-16 DIAGNOSIS — G4700 Insomnia, unspecified: Secondary | ICD-10-CM

## 2020-07-16 DIAGNOSIS — I10 Essential (primary) hypertension: Secondary | ICD-10-CM | POA: Diagnosis not present

## 2020-07-16 NOTE — Telephone Encounter (Signed)
Pt called madew an appointment and she needs a refill on her trazadone to be sent to the [facility] on [address]

## 2020-07-16 NOTE — Telephone Encounter (Signed)
Refill sent.

## 2020-08-06 ENCOUNTER — Other Ambulatory Visit: Payer: Self-pay

## 2020-08-06 ENCOUNTER — Ambulatory Visit
Payer: Medicare HMO | Attending: Student in an Organized Health Care Education/Training Program | Admitting: Student in an Organized Health Care Education/Training Program

## 2020-08-06 ENCOUNTER — Encounter: Payer: Self-pay | Admitting: Student in an Organized Health Care Education/Training Program

## 2020-08-06 VITALS — BP 143/78 | HR 74 | Temp 97.8°F | Resp 18 | Ht 69.0 in | Wt 230.0 lb

## 2020-08-06 DIAGNOSIS — M7918 Myalgia, other site: Secondary | ICD-10-CM | POA: Diagnosis not present

## 2020-08-06 DIAGNOSIS — G894 Chronic pain syndrome: Secondary | ICD-10-CM | POA: Diagnosis not present

## 2020-08-06 DIAGNOSIS — M797 Fibromyalgia: Secondary | ICD-10-CM

## 2020-08-06 DIAGNOSIS — S12100G Unspecified displaced fracture of second cervical vertebra, subsequent encounter for fracture with delayed healing: Secondary | ICD-10-CM

## 2020-08-06 DIAGNOSIS — M542 Cervicalgia: Secondary | ICD-10-CM

## 2020-08-06 MED ORDER — BUPRENORPHINE 15 MCG/HR TD PTWK: 1.0000 | MEDICATED_PATCH | TRANSDERMAL | 1 refills | Status: DC

## 2020-08-06 MED ORDER — OXYCODONE-ACETAMINOPHEN 10-325 MG PO TABS: 1.0000 | ORAL_TABLET | Freq: Three times a day (TID) | ORAL | 0 refills | Status: AC | PRN

## 2020-08-06 MED ORDER — OXYCODONE-ACETAMINOPHEN 10-325 MG PO TABS: 1.0000 | ORAL_TABLET | Freq: Three times a day (TID) | ORAL | 0 refills | Status: DC | PRN

## 2020-08-06 NOTE — Patient Instructions (Signed)
Oxycodone/APAP to last until 11/14/2020 and Butrans patches with 1 refill have been escribed to your pharmacy.
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
## 2020-08-06 NOTE — Progress Notes (Signed)
Nursing Pain Medication Assessment:  Safety precautions to be maintained throughout the outpatient stay will include: orient to surroundings, keep bed in low position, maintain call bell within reach at all times, provide assistance with transfer out of bed and ambulation.  Medication Inspection Compliance: Gina Wallace did not comply with our request to bring her pills to be counted. She was reminded that bringing the medication bottles, even when empty, is a requirement.  Medication: None brought in. Pill/Patch Count: None available to be counted. Bottle Appearance: No container available. Did not bring bottle(s) to appointment. Filled Date: N/A Last Medication intake:  Yesterday

## 2020-08-19 ENCOUNTER — Telehealth: Payer: Self-pay

## 2020-08-19 NOTE — Telephone Encounter (Signed)
Advised to remove the patch that is causing the skin problems. Suggested that she try another patch on a different area of the skin. May call the pharmacy to see if they have changed brands.

## 2020-08-19 NOTE — Telephone Encounter (Signed)
Pt states that pain patch has caused a skin reaction. Area was red and bumpy and itchy. Wants to know if she needs to stop with the patch and can she send you picture documentation of rash.

## 2020-08-29 ENCOUNTER — Encounter: Payer: Self-pay | Admitting: Psychiatry

## 2020-08-29 ENCOUNTER — Telehealth (INDEPENDENT_AMBULATORY_CARE_PROVIDER_SITE_OTHER): Payer: Medicare HMO | Admitting: Psychiatry

## 2020-08-29 ENCOUNTER — Telehealth: Payer: Self-pay | Admitting: Psychiatry

## 2020-08-29 VITALS — BP 117/78 | HR 82

## 2020-08-29 DIAGNOSIS — F3130 Bipolar disorder, current episode depressed, mild or moderate severity, unspecified: Secondary | ICD-10-CM

## 2020-08-29 DIAGNOSIS — F901 Attention-deficit hyperactivity disorder, predominantly hyperactive type: Secondary | ICD-10-CM

## 2020-08-29 DIAGNOSIS — G4700 Insomnia, unspecified: Secondary | ICD-10-CM

## 2020-08-29 MED ORDER — LAMOTRIGINE 25 MG PO TABS: ORAL_TABLET | ORAL | 0 refills | Status: DC

## 2020-08-29 MED ORDER — BUPROPION HCL ER (XL) 150 MG PO TB24: 150.0000 mg | ORAL_TABLET | Freq: Every day | ORAL | 2 refills | Status: DC

## 2020-08-29 MED ORDER — AMPHETAMINE-DEXTROAMPHETAMINE 20 MG PO TABS: 20.0000 mg | ORAL_TABLET | Freq: Three times a day (TID) | ORAL | 0 refills | Status: DC

## 2020-08-29 MED ORDER — TRAZODONE HCL 150 MG PO TABS
ORAL_TABLET | ORAL | 1 refills | Status: DC
Start: 2020-08-29 — End: 2021-01-01

## 2020-08-29 MED ORDER — LAMOTRIGINE 100 MG PO TABS: ORAL_TABLET | ORAL | 0 refills | Status: DC

## 2020-08-29 NOTE — Telephone Encounter (Signed)
[name], Gina Wallace are scheduled for a virtual visit with your provider today.    Just as we do with appointments in the office, we must obtain your consent to participate.  Your consent will be active for this visit and any virtual visit you may have with one of our providers in the next 365 days.    If you have a MyChart account, I can also send a copy of this consent to you electronically.  All virtual visits are billed to your insurance company just like a traditional visit in the office.  As this is a virtual visit, video technology does not allow for your provider to perform a traditional examination.  This may limit your provider's ability to fully assess your condition.  If your provider identifies any concerns that need to be evaluated in person or the need to arrange testing such as labs, EKG, etc, we will make arrangements to do so.    Although advances in technology are sophisticated, we cannot ensure that it will always work on either your end or our end.  If the connection with a video visit is poor, we may have to switch to a telephone visit.  With either a video or telephone visit, we are not always able to ensure that we have a secure connection.   I need to obtain your verbal consent now.   Are you willing to proceed with your visit today?   Gina Wallace has provided verbal consent on 08/29/2020 for a virtual visit (video or telephone).   Thayer Headings, PMHNP 08/29/2020  3:35 PM
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
## 2020-09-03 ENCOUNTER — Telehealth: Payer: Self-pay

## 2020-09-03 NOTE — Telephone Encounter (Signed)
Pt states that she is allergic to the Manufacturer pain patch she needs a new prescription of Buprenorphine 15 MCG Sent to the [facility] on [address] in [place], [place] .Marland Kitchen  They have the one she is not allergic to

## 2020-09-03 NOTE — Telephone Encounter (Signed)
Called patient for more info. No answer. LVM.

## 2020-09-04 DIAGNOSIS — F319 Bipolar disorder, unspecified: Secondary | ICD-10-CM | POA: Diagnosis not present

## 2020-09-04 DIAGNOSIS — E669 Obesity, unspecified: Secondary | ICD-10-CM | POA: Diagnosis not present

## 2020-09-04 DIAGNOSIS — I10 Essential (primary) hypertension: Secondary | ICD-10-CM | POA: Diagnosis not present

## 2020-09-04 DIAGNOSIS — R6889 Other general symptoms and signs: Secondary | ICD-10-CM | POA: Diagnosis not present

## 2020-09-04 DIAGNOSIS — Z1231 Encounter for screening mammogram for malignant neoplasm of breast: Secondary | ICD-10-CM | POA: Diagnosis not present

## 2020-09-05 ENCOUNTER — Other Ambulatory Visit: Payer: Self-pay | Admitting: Student in an Organized Health Care Education/Training Program

## 2020-09-05 MED ORDER — BUPRENORPHINE 15 MCG/HR TD PTWK: 1.0000 | MEDICATED_PATCH | TRANSDERMAL | 1 refills | Status: DC

## 2020-09-05 NOTE — Telephone Encounter (Signed)
Patient returned call. Dr. Holley Raring to send in new script to [facility].

## 2020-09-05 NOTE — Progress Notes (Signed)
Requested Prescriptions   Signed Prescriptions Disp Refills  . buprenorphine (BUTRANS) 15 MCG/HR 4 patch 1    Sig: Place 1 patch onto the skin every 7 (seven) days.

## 2020-09-07 ENCOUNTER — Emergency Department: Payer: Medicare HMO

## 2020-09-07 ENCOUNTER — Other Ambulatory Visit: Payer: Self-pay

## 2020-09-07 ENCOUNTER — Emergency Department
Admission: EM | Admit: 2020-09-07 | Discharge: 2020-09-07 | Disposition: A | Discharge status: Home / Self Care | Payer: Medicare HMO | Attending: Emergency Medicine | Admitting: Emergency Medicine

## 2020-09-07 DIAGNOSIS — Y92838 Other recreation area as the place of occurrence of the external cause: Secondary | ICD-10-CM | POA: Insufficient documentation

## 2020-09-07 DIAGNOSIS — W010XXA Fall on same level from slipping, tripping and stumbling without subsequent striking against object, initial encounter: Secondary | ICD-10-CM | POA: Insufficient documentation

## 2020-09-07 DIAGNOSIS — Z87891 Personal history of nicotine dependence: Secondary | ICD-10-CM | POA: Diagnosis not present

## 2020-09-07 DIAGNOSIS — M25561 Pain in right knee: Secondary | ICD-10-CM | POA: Diagnosis not present

## 2020-09-07 DIAGNOSIS — I10 Essential (primary) hypertension: Secondary | ICD-10-CM | POA: Diagnosis not present

## 2020-09-07 DIAGNOSIS — Z79899 Other long term (current) drug therapy: Secondary | ICD-10-CM | POA: Diagnosis not present

## 2020-09-07 DIAGNOSIS — Y9301 Activity, walking, marching and hiking: Secondary | ICD-10-CM | POA: Diagnosis not present

## 2020-09-07 DIAGNOSIS — S8991XA Unspecified injury of right lower leg, initial encounter: Secondary | ICD-10-CM

## 2020-09-07 DIAGNOSIS — S80911A Unspecified superficial injury of right knee, initial encounter: Secondary | ICD-10-CM | POA: Diagnosis not present

## 2020-09-07 MED ORDER — ACETAMINOPHEN 325 MG PO TABS
650.0000 mg | ORAL_TABLET | Freq: Once | ORAL | Status: AC
  Administered 2020-09-07: 650 mg via ORAL
  Filled 2020-09-07: qty 2

## 2020-09-07 MED ORDER — KETOROLAC TROMETHAMINE 10 MG PO TABS: 10.0000 mg | ORAL_TABLET | Freq: Four times a day (QID) | ORAL | 0 refills | Status: AC | PRN

## 2020-09-07 MED ORDER — KETOROLAC TROMETHAMINE 60 MG/2ML IM SOLN
30.0000 mg | Freq: Once | INTRAMUSCULAR | Status: AC
  Administered 2020-09-07: 30 mg via INTRAMUSCULAR
  Filled 2020-09-07: qty 2
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
## 2020-09-07 NOTE — ED Notes (Signed)
Pt states she was at the gym running and fell on her face and hurt her right knee. Pt's right knee swollen and bruised.

## 2020-09-07 NOTE — ED Notes (Signed)
Pt refused to stay for duration of 15 minute protocol after IM injections. Pt states she takes toradol all the time. Risks explained. Pt refused to stay, pt wheeled to lobby for discharge.

## 2020-09-07 NOTE — Discharge Instructions (Addendum)
Please use Toradol (anti-inflammatory) for 5 days. You may also use muscle relaxant you already have prescribed to you as well as Tylenol, up to 4x daily. Use crutches and knee immobilizer until follow up with orthopedics.

## 2020-09-07 NOTE — ED Triage Notes (Signed)
Pt states she had a fall at the gym 2 days ago and is having r knee pain- pt put on a boot from home- pt states she cannot bear weight on he right leg at all

## 2020-09-12 ENCOUNTER — Telehealth: Payer: Self-pay

## 2020-09-12 NOTE — Telephone Encounter (Signed)
Patient called and states that she can no longer take the Butrans patch because it is causing blisters.  She wanted to ask Dr Holley Raring if he could increase her oxycodone back to #90 instead of #75 that he switched to when he put her on the [facility] patch.  Spoke with Dr Holley Raring and he states to have the patient take her oxycodone tid and to make an appointment earlier for med refill.  Will cancel the remaining Butrans patch and remaining oxycodone with the pharmacy.  Patient notified.

## 2020-09-16 DIAGNOSIS — M25561 Pain in right knee: Secondary | ICD-10-CM | POA: Diagnosis not present

## 2020-09-16 DIAGNOSIS — M2391 Unspecified internal derangement of right knee: Secondary | ICD-10-CM | POA: Diagnosis not present

## 2020-09-16 NOTE — Telephone Encounter (Signed)
Cancelled remaining Butrans patch and oxycodone medications with [place], as ordered by Dr Holley Raring

## 2020-10-07 DIAGNOSIS — H35372 Puckering of macula, left eye: Secondary | ICD-10-CM | POA: Diagnosis not present

## 2020-10-07 DIAGNOSIS — H353133 Nonexudative age-related macular degeneration, bilateral, advanced atrophic without subfoveal involvement: Secondary | ICD-10-CM | POA: Diagnosis not present

## 2020-10-14 ENCOUNTER — Other Ambulatory Visit: Payer: Self-pay

## 2020-10-14 ENCOUNTER — Ambulatory Visit
Payer: Medicare HMO | Attending: Student in an Organized Health Care Education/Training Program | Admitting: Student in an Organized Health Care Education/Training Program

## 2020-10-14 ENCOUNTER — Encounter: Payer: Self-pay | Admitting: Student in an Organized Health Care Education/Training Program

## 2020-10-14 VITALS — BP 147/85 | HR 84 | Temp 97.2°F | Resp 16 | Ht 69.0 in | Wt 235.0 lb

## 2020-10-14 DIAGNOSIS — S8001XA Contusion of right knee, initial encounter: Secondary | ICD-10-CM | POA: Insufficient documentation

## 2020-10-14 DIAGNOSIS — S12100G Unspecified displaced fracture of second cervical vertebra, subsequent encounter for fracture with delayed healing: Secondary | ICD-10-CM | POA: Diagnosis not present

## 2020-10-14 DIAGNOSIS — M7918 Myalgia, other site: Secondary | ICD-10-CM

## 2020-10-14 DIAGNOSIS — M545 Low back pain, unspecified: Secondary | ICD-10-CM | POA: Diagnosis not present

## 2020-10-14 DIAGNOSIS — G894 Chronic pain syndrome: Secondary | ICD-10-CM

## 2020-10-14 DIAGNOSIS — M62838 Other muscle spasm: Secondary | ICD-10-CM | POA: Diagnosis not present

## 2020-10-14 DIAGNOSIS — Z79899 Other long term (current) drug therapy: Secondary | ICD-10-CM | POA: Diagnosis not present

## 2020-10-14 DIAGNOSIS — M542 Cervicalgia: Secondary | ICD-10-CM

## 2020-10-14 DIAGNOSIS — M797 Fibromyalgia: Secondary | ICD-10-CM

## 2020-10-14 DIAGNOSIS — F329 Major depressive disorder, single episode, unspecified: Secondary | ICD-10-CM | POA: Diagnosis not present

## 2020-10-14 DIAGNOSIS — Y929 Unspecified place or not applicable: Secondary | ICD-10-CM | POA: Insufficient documentation

## 2020-10-14 DIAGNOSIS — F5104 Psychophysiologic insomnia: Secondary | ICD-10-CM | POA: Diagnosis not present

## 2020-10-14 DIAGNOSIS — Y939 Activity, unspecified: Secondary | ICD-10-CM | POA: Diagnosis not present

## 2020-10-14 DIAGNOSIS — Z87891 Personal history of nicotine dependence: Secondary | ICD-10-CM | POA: Diagnosis not present

## 2020-10-14 DIAGNOSIS — S12110A Anterior displaced Type II dens fracture, initial encounter for closed fracture: Secondary | ICD-10-CM | POA: Insufficient documentation

## 2020-10-14 MED ORDER — OXYCODONE-ACETAMINOPHEN 10-325 MG PO TABS
1.0000 | ORAL_TABLET | Freq: Three times a day (TID) | ORAL | 0 refills | Status: DC | PRN
Start: 2020-12-14 — End: 2020-12-09

## 2020-10-14 MED ORDER — OXYCODONE-ACETAMINOPHEN 10-325 MG PO TABS: 1.0000 | ORAL_TABLET | Freq: Three times a day (TID) | ORAL | 0 refills | Status: AC | PRN

## 2020-10-14 MED ORDER — OXYCODONE-ACETAMINOPHEN 10-325 MG PO TABS: 1.0000 | ORAL_TABLET | Freq: Three times a day (TID) | ORAL | 0 refills | Status: DC | PRN

## 2020-10-14 MED ORDER — OXYCODONE-ACETAMINOPHEN 10-325 MG PO TABS
1.0000 | ORAL_TABLET | Freq: Three times a day (TID) | ORAL | 0 refills | Status: DC | PRN
Start: 2020-11-14 — End: 2020-10-14
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
## 2020-10-14 NOTE — Progress Notes (Signed)
[facility] called and all 3 oxycodone scripts sent in today 10/14/2020 were cancelled. Spoke with Caryl Pina.

## 2020-10-14 NOTE — Progress Notes (Signed)
Per patient request and verbal permission, discarded Buprenorphine Patch 10mg  and Buprenorphine Patch 15mg . Patient was unsatisfied with this medication due to reaction.   Buprenorphine Patch 10mg  discarded 2 patches.  Buprenorphine Patch 15mg  discarded 3 patches.   Discarded and wasted safely via [employer].  Witnessed by Louann Liv, RN.   Janne Napoleon, RN

## 2020-10-14 NOTE — Progress Notes (Signed)
Nursing Pain Medication Assessment:  Safety precautions to be maintained throughout the outpatient stay will include: orient to surroundings, keep bed in low position, maintain call bell within reach at all times, provide assistance with transfer out of bed and ambulation.  Medication Inspection Compliance: Pill count conducted under aseptic conditions, in front of the patient. Neither the pills nor the bottle was removed from the patient's sight at any time. Once count was completed pills were immediately returned to the patient in their original bottle.  Medication: Oxycodone IR Pill/Patch Count: 3 of 75 pills remain Pill/Patch Appearance: Markings consistent with prescribed medication Bottle Appearance: Standard pharmacy container. Clearly labeled. Filled Date: 03 / 06 / 2022 Last Medication intake:  Today

## 2020-10-17 ENCOUNTER — Telehealth (INDEPENDENT_AMBULATORY_CARE_PROVIDER_SITE_OTHER): Payer: Medicare HMO | Admitting: Psychiatry

## 2020-10-17 ENCOUNTER — Encounter: Payer: Self-pay | Admitting: Psychiatry

## 2020-10-17 ENCOUNTER — Encounter: Payer: Medicare HMO | Admitting: Student in an Organized Health Care Education/Training Program

## 2020-10-17 DIAGNOSIS — F901 Attention-deficit hyperactivity disorder, predominantly hyperactive type: Secondary | ICD-10-CM | POA: Diagnosis not present

## 2020-10-17 DIAGNOSIS — F3130 Bipolar disorder, current episode depressed, mild or moderate severity, unspecified: Secondary | ICD-10-CM | POA: Diagnosis not present

## 2020-10-17 MED ORDER — ARIPIPRAZOLE 5 MG PO TABS: 5.0000 mg | ORAL_TABLET | Freq: Every day | ORAL | 2 refills | Status: DC

## 2020-10-17 MED ORDER — LAMOTRIGINE 150 MG PO TABS
150.0000 mg | ORAL_TABLET | Freq: Every day | ORAL | 2 refills | Status: DC
Start: 2020-10-17 — End: 2020-12-05

## 2020-10-17 MED ORDER — BUPROPION HCL ER (XL) 300 MG PO TB24: 300.0000 mg | ORAL_TABLET | Freq: Every day | ORAL | 1 refills | Status: DC

## 2020-10-17 MED ORDER — AMPHETAMINE-DEXTROAMPHETAMINE 20 MG PO TABS
20.0000 mg | ORAL_TABLET | Freq: Three times a day (TID) | ORAL | 0 refills | Status: DC
Start: 2020-12-01 — End: 2021-01-01
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
## 2020-11-12 ENCOUNTER — Telehealth: Payer: Self-pay | Admitting: Psychiatry

## 2020-11-12 NOTE — Telephone Encounter (Signed)
Please clarify what strength she is taking.

## 2020-11-12 NOTE — Telephone Encounter (Signed)
Gina Wallace called  & has been taking 1 Abilify daily but she wasn't having much improvement so she started taking 2/day of Abilify and it is helping a lot. She is requesting a refill of Abilify called to:  [facility], [place] Phone:  [phone]  Fax:  [phone]

## 2020-11-13 ENCOUNTER — Telehealth: Payer: Self-pay | Admitting: Psychiatry

## 2020-11-13 ENCOUNTER — Other Ambulatory Visit: Payer: Self-pay

## 2020-11-13 DIAGNOSIS — F3130 Bipolar disorder, current episode depressed, mild or moderate severity, unspecified: Secondary | ICD-10-CM

## 2020-11-13 MED ORDER — ARIPIPRAZOLE 10 MG PO TABS: 10.0000 mg | ORAL_TABLET | Freq: Every day | ORAL | 1 refills | Status: DC

## 2020-11-13 NOTE — Telephone Encounter (Signed)
Here is the dose she is taking.I went ahead and sent

## 2020-11-13 NOTE — Telephone Encounter (Signed)
Gina Wallace- just want to confirm okay for pt to take Abilify 10 mg daily? She was in 4 weeks ago and recommended she restart 5 mg Abilify. Pt reports she started taking 2 of the 5 mg.

## 2020-11-13 NOTE — Telephone Encounter (Signed)
Gina Wallace called to report that she is taking 5 mg tablets of Abilify but she is taking 2/day.  She needs a new prescription for 10mg  1/day.  Because she has been taking 2 of the 5mg  she is out early so is ready for a new prescription.  Send to [facility] on [address], [place]

## 2020-11-14 ENCOUNTER — Emergency Department: Payer: Medicare HMO

## 2020-11-14 ENCOUNTER — Other Ambulatory Visit: Payer: Self-pay

## 2020-11-14 ENCOUNTER — Emergency Department
Admission: EM | Admit: 2020-11-14 | Discharge: 2020-11-14 | Disposition: A | Discharge status: Home / Self Care | Payer: Medicare HMO | Attending: Emergency Medicine | Admitting: Emergency Medicine

## 2020-11-14 DIAGNOSIS — M546 Pain in thoracic spine: Secondary | ICD-10-CM | POA: Insufficient documentation

## 2020-11-14 DIAGNOSIS — R42 Dizziness and giddiness: Secondary | ICD-10-CM | POA: Insufficient documentation

## 2020-11-14 DIAGNOSIS — Z87891 Personal history of nicotine dependence: Secondary | ICD-10-CM | POA: Insufficient documentation

## 2020-11-14 DIAGNOSIS — Z79899 Other long term (current) drug therapy: Secondary | ICD-10-CM | POA: Insufficient documentation

## 2020-11-14 DIAGNOSIS — R5383 Other fatigue: Secondary | ICD-10-CM | POA: Diagnosis not present

## 2020-11-14 DIAGNOSIS — I10 Essential (primary) hypertension: Secondary | ICD-10-CM | POA: Insufficient documentation

## 2020-11-14 DIAGNOSIS — K8020 Calculus of gallbladder without cholecystitis without obstruction: Secondary | ICD-10-CM | POA: Insufficient documentation

## 2020-11-14 DIAGNOSIS — R0602 Shortness of breath: Secondary | ICD-10-CM | POA: Insufficient documentation

## 2020-11-14 DIAGNOSIS — R079 Chest pain, unspecified: Secondary | ICD-10-CM | POA: Diagnosis not present

## 2020-11-14 DIAGNOSIS — M25511 Pain in right shoulder: Secondary | ICD-10-CM | POA: Insufficient documentation

## 2020-11-14 DIAGNOSIS — M25512 Pain in left shoulder: Secondary | ICD-10-CM | POA: Insufficient documentation

## 2020-11-14 DIAGNOSIS — I700 Atherosclerosis of aorta: Secondary | ICD-10-CM | POA: Diagnosis not present

## 2020-11-14 DIAGNOSIS — R7989 Other specified abnormal findings of blood chemistry: Secondary | ICD-10-CM

## 2020-11-14 DIAGNOSIS — K449 Diaphragmatic hernia without obstruction or gangrene: Secondary | ICD-10-CM | POA: Diagnosis not present

## 2020-11-14 DIAGNOSIS — R791 Abnormal coagulation profile: Secondary | ICD-10-CM | POA: Insufficient documentation

## 2020-11-14 DIAGNOSIS — Z20822 Contact with and (suspected) exposure to covid-19: Secondary | ICD-10-CM | POA: Insufficient documentation

## 2020-11-14 LAB — BASIC METABOLIC PANEL
Anion gap: 8 (ref 5–15)
BUN: 23 mg/dL (ref 8–23)
CO2: 23 mmol/L (ref 22–32)
Calcium: 9.4 mg/dL (ref 8.9–10.3)
Chloride: 106 mmol/L (ref 98–111)
Creatinine, Ser: 1.23 mg/dL — ABNORMAL HIGH (ref 0.44–1.00)
GFR, Estimated: 49 mL/min — ABNORMAL LOW (ref >60)
Glucose, Bld: 102 mg/dL — ABNORMAL HIGH (ref 70–99)
Potassium: 4.4 mmol/L (ref 3.5–5.1)
Sodium: 137 mmol/L (ref 135–145)

## 2020-11-14 LAB — HEPATIC FUNCTION PANEL
ALT: 14 U/L (ref 0–44)
AST: 22 U/L (ref 15–41)
Albumin: 4.4 g/dL (ref 3.5–5.0)
Alkaline Phosphatase: 59 U/L (ref 38–126)
Bilirubin, Direct: <0.1 mg/dL (ref 0.0–0.2)
Total Bilirubin: 0.6 mg/dL (ref 0.3–1.2)
Total Protein: 7.1 g/dL (ref 6.5–8.1)

## 2020-11-14 LAB — CBC
HCT: 42.6 % (ref 36.0–46.0)
Hemoglobin: 14.3 g/dL (ref 12.0–15.0)
MCH: 29.2 pg (ref 26.0–34.0)
MCHC: 33.6 g/dL (ref 30.0–36.0)
MCV: 87.1 fL (ref 80.0–100.0)
Platelets: 200 10*3/uL (ref 150–400)
RBC: 4.89 MIL/uL (ref 3.87–5.11)
RDW: 12.8 % (ref 11.5–15.5)
WBC: 5.3 10*3/uL (ref 4.0–10.5)
nRBC: 0 % (ref 0.0–0.2)

## 2020-11-14 LAB — RESP PANEL BY RT-PCR (FLU A&B, COVID) ARPGX2
Influenza A by PCR: NEGATIVE
Influenza B by PCR: NEGATIVE
SARS Coronavirus 2 by RT PCR: NEGATIVE

## 2020-11-14 LAB — D-DIMER, QUANTITATIVE: D-Dimer, Quant: 0.81 ug/mL-FEU — ABNORMAL HIGH (ref 0.00–0.50)

## 2020-11-14 LAB — TROPONIN I (HIGH SENSITIVITY): Troponin I (High Sensitivity): 3 ng/L (ref <18)

## 2020-11-14 MED ORDER — IOHEXOL 350 MG/ML SOLN
75.0000 mL | Freq: Once | INTRAVENOUS | Status: AC | PRN
  Administered 2020-11-14: 75 mL via INTRAVENOUS
  Filled 2020-11-14: qty 75
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
## 2020-11-14 NOTE — ED Notes (Signed)
Patient transported to CT 

## 2020-11-14 NOTE — ED Triage Notes (Signed)
Pt to ER with complaints of bilateral shoulder pain that came on suddenly yesterday, patient reports being out shopping and feeling so badly that she had to go lay down. Pain between shoulder blades radiates into mid back area. Reports that she started feeling winded/short of breath, and extremely fatigued. Some dizziness when ambulating.

## 2020-11-14 NOTE — ED Notes (Signed)
Patient returned to room. 

## 2020-11-21 DIAGNOSIS — H353124 Nonexudative age-related macular degeneration, left eye, advanced atrophic with subfoveal involvement: Secondary | ICD-10-CM | POA: Diagnosis not present

## 2020-11-21 DIAGNOSIS — H353113 Nonexudative age-related macular degeneration, right eye, advanced atrophic without subfoveal involvement: Secondary | ICD-10-CM | POA: Diagnosis not present

## 2020-12-05 ENCOUNTER — Inpatient Hospital Stay: Payer: Medicare HMO

## 2020-12-05 ENCOUNTER — Emergency Department: Payer: Medicare HMO

## 2020-12-05 ENCOUNTER — Other Ambulatory Visit: Payer: Self-pay

## 2020-12-05 ENCOUNTER — Inpatient Hospital Stay
Admission: EM | Admit: 2020-12-05 | Discharge: 2020-12-09 | DRG: 390 | Disposition: A | Discharge status: Home / Self Care | Payer: Medicare HMO | Attending: Internal Medicine | Admitting: Internal Medicine

## 2020-12-05 ENCOUNTER — Encounter: Payer: Self-pay | Admitting: Emergency Medicine

## 2020-12-05 DIAGNOSIS — E876 Hypokalemia: Secondary | ICD-10-CM | POA: Diagnosis not present

## 2020-12-05 DIAGNOSIS — I10 Essential (primary) hypertension: Secondary | ICD-10-CM | POA: Diagnosis not present

## 2020-12-05 DIAGNOSIS — K56699 Other intestinal obstruction unspecified as to partial versus complete obstruction: Secondary | ICD-10-CM | POA: Diagnosis not present

## 2020-12-05 DIAGNOSIS — K6389 Other specified diseases of intestine: Secondary | ICD-10-CM | POA: Diagnosis not present

## 2020-12-05 DIAGNOSIS — K449 Diaphragmatic hernia without obstruction or gangrene: Secondary | ICD-10-CM | POA: Diagnosis not present

## 2020-12-05 DIAGNOSIS — Z79891 Long term (current) use of opiate analgesic: Secondary | ICD-10-CM

## 2020-12-05 DIAGNOSIS — F3170 Bipolar disorder, currently in remission, most recent episode unspecified: Secondary | ICD-10-CM | POA: Diagnosis not present

## 2020-12-05 DIAGNOSIS — F419 Anxiety disorder, unspecified: Secondary | ICD-10-CM | POA: Diagnosis present

## 2020-12-05 DIAGNOSIS — Z90710 Acquired absence of both cervix and uterus: Secondary | ICD-10-CM

## 2020-12-05 DIAGNOSIS — Z972 Presence of dental prosthetic device (complete) (partial): Secondary | ICD-10-CM

## 2020-12-05 DIAGNOSIS — R197 Diarrhea, unspecified: Secondary | ICD-10-CM

## 2020-12-05 DIAGNOSIS — N281 Cyst of kidney, acquired: Secondary | ICD-10-CM | POA: Diagnosis present

## 2020-12-05 DIAGNOSIS — R1114 Bilious vomiting: Secondary | ICD-10-CM

## 2020-12-05 DIAGNOSIS — Z20822 Contact with and (suspected) exposure to covid-19: Secondary | ICD-10-CM | POA: Diagnosis present

## 2020-12-05 DIAGNOSIS — M797 Fibromyalgia: Secondary | ICD-10-CM | POA: Diagnosis present

## 2020-12-05 DIAGNOSIS — F418 Other specified anxiety disorders: Secondary | ICD-10-CM

## 2020-12-05 DIAGNOSIS — E559 Vitamin D deficiency, unspecified: Secondary | ICD-10-CM | POA: Diagnosis not present

## 2020-12-05 DIAGNOSIS — Z79899 Other long term (current) drug therapy: Secondary | ICD-10-CM

## 2020-12-05 DIAGNOSIS — H3530 Unspecified macular degeneration: Secondary | ICD-10-CM | POA: Diagnosis present

## 2020-12-05 DIAGNOSIS — E890 Postprocedural hypothyroidism: Secondary | ICD-10-CM | POA: Diagnosis present

## 2020-12-05 DIAGNOSIS — Z818 Family history of other mental and behavioral disorders: Secondary | ICD-10-CM

## 2020-12-05 DIAGNOSIS — K565 Intestinal adhesions [bands], unspecified as to partial versus complete obstruction: Principal | ICD-10-CM | POA: Diagnosis present

## 2020-12-05 DIAGNOSIS — G894 Chronic pain syndrome: Secondary | ICD-10-CM | POA: Diagnosis present

## 2020-12-05 DIAGNOSIS — K56600 Partial intestinal obstruction, unspecified as to cause: Secondary | ICD-10-CM | POA: Diagnosis not present

## 2020-12-05 DIAGNOSIS — K56609 Unspecified intestinal obstruction, unspecified as to partial versus complete obstruction: Secondary | ICD-10-CM | POA: Diagnosis not present

## 2020-12-05 DIAGNOSIS — R1084 Generalized abdominal pain: Secondary | ICD-10-CM

## 2020-12-05 DIAGNOSIS — Z888 Allergy status to other drugs, medicaments and biological substances status: Secondary | ICD-10-CM

## 2020-12-05 DIAGNOSIS — K8020 Calculus of gallbladder without cholecystitis without obstruction: Secondary | ICD-10-CM | POA: Diagnosis not present

## 2020-12-05 DIAGNOSIS — Z0189 Encounter for other specified special examinations: Secondary | ICD-10-CM

## 2020-12-05 DIAGNOSIS — Z4682 Encounter for fitting and adjustment of non-vascular catheter: Secondary | ICD-10-CM | POA: Diagnosis not present

## 2020-12-05 DIAGNOSIS — R319 Hematuria, unspecified: Secondary | ICD-10-CM

## 2020-12-05 DIAGNOSIS — F909 Attention-deficit hyperactivity disorder, unspecified type: Secondary | ICD-10-CM | POA: Diagnosis present

## 2020-12-05 DIAGNOSIS — R109 Unspecified abdominal pain: Secondary | ICD-10-CM | POA: Diagnosis not present

## 2020-12-05 DIAGNOSIS — Z87891 Personal history of nicotine dependence: Secondary | ICD-10-CM | POA: Diagnosis not present

## 2020-12-05 LAB — COMPREHENSIVE METABOLIC PANEL
ALT: 18 U/L (ref 0–44)
AST: 30 U/L (ref 15–41)
Albumin: 4.9 g/dL (ref 3.5–5.0)
Alkaline Phosphatase: 75 U/L (ref 38–126)
Anion gap: 13 (ref 5–15)
BUN: 14 mg/dL (ref 8–23)
CO2: 25 mmol/L (ref 22–32)
Calcium: 10.1 mg/dL (ref 8.9–10.3)
Chloride: 101 mmol/L (ref 98–111)
Creatinine, Ser: 1.05 mg/dL — ABNORMAL HIGH (ref 0.44–1.00)
GFR, Estimated: 59 mL/min — ABNORMAL LOW (ref >60)
Glucose, Bld: 138 mg/dL — ABNORMAL HIGH (ref 70–99)
Potassium: 3.9 mmol/L (ref 3.5–5.1)
Sodium: 139 mmol/L (ref 135–145)
Total Bilirubin: 1 mg/dL (ref 0.3–1.2)
Total Protein: 7.8 g/dL (ref 6.5–8.1)

## 2020-12-05 LAB — CBC WITH DIFFERENTIAL/PLATELET
Abs Immature Granulocytes: 0.02 10*3/uL (ref 0.00–0.07)
Basophils Absolute: 0 10*3/uL (ref 0.0–0.1)
Basophils Relative: 0 %
Eosinophils Absolute: 0 10*3/uL (ref 0.0–0.5)
Eosinophils Relative: 0 %
HCT: 42.9 % (ref 36.0–46.0)
Hemoglobin: 14.7 g/dL (ref 12.0–15.0)
Immature Granulocytes: 0 %
Lymphocytes Relative: 13 %
Lymphs Abs: 0.9 10*3/uL (ref 0.7–4.0)
MCH: 29.8 pg (ref 26.0–34.0)
MCHC: 34.3 g/dL (ref 30.0–36.0)
MCV: 87 fL (ref 80.0–100.0)
Monocytes Absolute: 0.4 10*3/uL (ref 0.1–1.0)
Monocytes Relative: 6 %
Neutro Abs: 5.9 10*3/uL (ref 1.7–7.7)
Neutrophils Relative %: 81 %
Platelets: 200 10*3/uL (ref 150–400)
RBC: 4.93 MIL/uL (ref 3.87–5.11)
RDW: 12.7 % (ref 11.5–15.5)
WBC: 7.3 10*3/uL (ref 4.0–10.5)
nRBC: 0 % (ref 0.0–0.2)

## 2020-12-05 LAB — SARS CORONAVIRUS 2 (TAT 6-24 HRS): SARS Coronavirus 2: NEGATIVE

## 2020-12-05 LAB — TROPONIN I (HIGH SENSITIVITY)
Troponin I (High Sensitivity): 4 ng/L (ref <18)
Troponin I (High Sensitivity): 3 ng/L (ref <18)

## 2020-12-05 LAB — HIV ANTIBODY (ROUTINE TESTING W REFLEX): HIV Screen 4th Generation wRfx: NONREACTIVE

## 2020-12-05 LAB — LIPASE, BLOOD: Lipase: 27 U/L (ref 11–51)

## 2020-12-05 MED ORDER — ONDANSETRON HCL 4 MG/2ML IJ SOLN
4.0000 mg | Freq: Four times a day (QID) | INTRAMUSCULAR | Status: DC | PRN
  Administered 2020-12-05 – 2020-12-06 (×2): 4 mg via INTRAVENOUS
  Filled 2020-12-05 (×3): qty 2

## 2020-12-05 MED ORDER — ONDANSETRON 4 MG PO TBDP: 4.0000 mg | ORAL_TABLET | Freq: Once | ORAL | Status: AC

## 2020-12-05 MED ORDER — LACTATED RINGERS IV BOLUS
1000.0000 mL | Freq: Once | INTRAVENOUS | Status: AC
  Administered 2020-12-05: 1000 mL via INTRAVENOUS

## 2020-12-05 MED ORDER — ONDANSETRON 4 MG PO TBDP
ORAL_TABLET | ORAL | Status: AC
  Administered 2020-12-05: 4 mg via ORAL
  Filled 2020-12-05: qty 1

## 2020-12-05 MED ORDER — PANTOPRAZOLE SODIUM 40 MG IV SOLR
40.0000 mg | INTRAVENOUS | Status: DC
  Administered 2020-12-05 – 2020-12-08 (×4): 40 mg via INTRAVENOUS
  Filled 2020-12-05 (×4): qty 40

## 2020-12-05 MED ORDER — DROPERIDOL 2.5 MG/ML IJ SOLN
2.5000 mg | Freq: Once | INTRAMUSCULAR | Status: AC
  Administered 2020-12-05: 2.5 mg via INTRAVENOUS
  Filled 2020-12-05: qty 2

## 2020-12-05 MED ORDER — MORPHINE SULFATE (PF) 2 MG/ML IV SOLN
2.0000 mg | INTRAVENOUS | Status: DC | PRN
Start: 2020-12-05 — End: 2020-12-06
  Administered 2020-12-05 – 2020-12-06 (×5): 2 mg via INTRAVENOUS
  Filled 2020-12-05 (×5): qty 1

## 2020-12-05 MED ORDER — ONDANSETRON HCL 4 MG PO TABS: 4.0000 mg | ORAL_TABLET | Freq: Four times a day (QID) | ORAL | Status: DC | PRN

## 2020-12-05 MED ORDER — SODIUM CHLORIDE 0.45 % IV SOLN: INTRAVENOUS | Status: DC

## 2020-12-05 MED ORDER — MORPHINE SULFATE (PF) 4 MG/ML IV SOLN
4.0000 mg | Freq: Once | INTRAVENOUS | Status: AC
  Administered 2020-12-05: 4 mg via INTRAVENOUS
  Filled 2020-12-05: qty 1

## 2020-12-05 MED ORDER — HYDRALAZINE HCL 20 MG/ML IJ SOLN: 10.0000 mg | Freq: Four times a day (QID) | INTRAMUSCULAR | Status: DC | PRN

## 2020-12-05 NOTE — H&P (Signed)
History and Physical    Gina Wallace KKX:[id number] DOB: [birth year] DOA: 12/05/2020  PCP: Kirk Ruths, MD   Patient coming from: Home  I have personally briefly reviewed patient's old medical records in [place]  Chief Complaint: Abdominal pain  HPI: Gina Wallace is a 65 y.o. female with medical history significant for anxiety disorder, bipolar disorder, fibromyalgia, hypertension, macular degeneration who presents to the ER for evaluation of abdominal pain which started about 3 AM on the day of her admission. Abdominal pain is mostly periumbilical associated with nausea and multiple episodes of emesis which she said is bilious and not containing any blood.  Her last bowel movement was 1 day prior to her admission. She rates her pain about an 8 x 10 in intensity at its worst and presented to the ER due to persistence of her pain. This pain feels similar to her episodes of bowel obstruction in the past. She denies having any fever, no chills, no urinary symptoms, no headache, no blurred vision, no dizziness, no lightheadedness, no palpitations, no diaphoresis, no focal deficits, no cough. Labs show sodium 139, potassium 3.9, chloride 101, bicarb 25, glucose 138, BUN 14, creatinine 1.05, calcium 10.9, alkaline phosphatase 75, albumin 4.9, lipase 27, AST 30, ALT 18, total protein 7.8, white count 7.3, hemoglobin 14.7, hematocrit 42.9, MCV 87, RDW 12.7, platelet count 200 CT scan of abdomen and pelvis without contrast shows distal small-bowel obstruction with transition point within the pelvis. Cholelithiasis. Aortic atherosclerosis.    ED Course: Patient is a 65 year old female who presents to the ER for evaluation of abdominal pain mostly periumbilical associated with nausea and multiple episodes of emesis.  CT scan of abdomen and pelvis without contrast shows distal small bowel obstruction.  She will be admitted to the hospital for further  evaluation.   Review of Systems: As per HPI otherwise all other systems reviewed and negative.    Past Medical History:  Diagnosis Date  . ADHD (attention deficit hyperactivity disorder)   . Anxiety   . Bipolar disorder ([place])   . Carpal tunnel syndrome   . Complication of anesthesia    pt requires larger doses of meds for effect  . Depression   . Fibromyalgia   . Hypertension   . Macular degeneration   . Vitamin D deficiency   . Wears dentures    full upper and lower    Past Surgical History:  Procedure Laterality Date  . ABDOMINAL HYSTERECTOMY    . ABLATION    . ANTERIOR CRUCIATE LIGAMENT REPAIR Right   . BREAST BIOPSY Bilateral    All negative  . CESAREAN SECTION    . EYE SURGERY    . HALLUX VALGUS LAPIDUS Left 02/02/2018   Procedure: HALLUX VALGUS LAPIDUS;  Surgeon: Samara Deist, DPM;  Location: [facility];  Service: Podiatry;  Laterality: Left;  general w popliteal block lapiplasty set  . HAMMER TOE SURGERY Left 02/02/2018   Procedure: HAMMER TOE CORRECTION - T1 & T2;  Surgeon: Samara Deist, DPM;  Location: [facility];  Service: Podiatry;  Laterality: Left;  . THYROIDECTOMY Right    left lobe remains  . TONSILLECTOMY AND ADENOIDECTOMY    . WEIL OSTEOTOMY Left 02/02/2018   Procedure: WEIL OSTEOTOMY X 2, REVERSE [place], AND Barbie Banner;  Surgeon: Samara Deist, DPM;  Location: [facility];  Service: Podiatry;  Laterality: Left;     reports that she quit smoking about 12 years ago. Her smoking use included cigarettes. She  has never used smokeless tobacco. She reports previous alcohol use. She reports that she does not use drugs.  Allergies  Allergen Reactions  . Butrans [Buprenorphine]     Family History  Problem Relation Age of Onset  . Anxiety disorder Mother   . Depression Mother   . Breast cancer Mother 61  . Alcohol abuse Maternal Uncle   . Breast cancer Maternal Aunt   . Breast cancer Maternal Grandmother       Prior to  Admission medications   Medication Sig Start Date End Date Taking? Authorizing Provider  amphetamine-dextroamphetamine (ADDERALL) 20 MG tablet Take 1 tablet (20 mg total) by mouth 3 (three) times daily. 12/01/20  Yes Thayer Headings, PMHNP  ARIPiprazole (ABILIFY) 10 MG tablet Take 1 tablet (10 mg total) by mouth daily. 11/13/20  Yes Thayer Headings, PMHNP  buPROPion (WELLBUTRIN XL) 300 MG 24 hr tablet Take 1 tablet (300 mg total) by mouth daily. 10/17/20  Yes Thayer Headings, PMHNP  lamoTRIgine (LAMICTAL) 150 MG tablet Take 150 mg by mouth daily.   Yes [provider]  oxyCODONE-acetaminophen (PERCOCET) 10-325 MG tablet Take 1 tablet by mouth every 8 (eight) hours as needed for pain. Must last 30 days. 11/14/20 12/14/20 Yes Gillis Santa, MD  polyethylene glycol (MIRALAX / GLYCOLAX) 17 g packet Take 17 g by mouth daily as needed for moderate constipation.   Yes [provider]  tiZANidine (ZANAFLEX) 4 MG capsule Take 4 mg by mouth 3 (three) times daily as needed (pain).    Yes [provider]  traZODone (DESYREL) 150 MG tablet TAKE TWO TO THREE TABLETS BY MOUTH AT [facility] BEDTIME Patient taking differently: Take 300-450 mg by mouth at bedtime. 08/29/20  Yes Thayer Headings, PMHNP  amphetamine-dextroamphetamine (ADDERALL) 20 MG tablet Take 1 tablet (20 mg total) by mouth 3 (three) times daily. Patient not taking: Reported on 12/05/2020 11/03/20   Thayer Headings, PMHNP  amphetamine-dextroamphetamine (ADDERALL) 20 MG tablet Take 1 tablet (20 mg total) by mouth 3 (three) times daily. 10/06/20 11/05/20  Thayer Headings, PMHNP  Cholecalciferol (VITAMIN D) 50 MCG (2000 UT) tablet Take 2,000 Units by mouth daily.    [provider]  Cyanocobalamin (VITAMIN B 12 PO) Take by mouth.    [provider]  lisinopril (ZESTRIL) 20 MG tablet Take 40 mg by mouth daily. 01/26/19   [provider]  oxyCODONE-acetaminophen (PERCOCET) 10-325 MG tablet Take 1 tablet by mouth every 8  (eight) hours as needed for pain. Must last 30 days. 12/14/20 01/13/21  Gillis Santa, MD  Probiotic Product (PROBIOTIC PO) Take by mouth.    [provider]    Physical Exam: Vitals:   12/05/20 0653 12/05/20 0658 12/05/20 0847 12/05/20 1000  BP:  (!) 153/77 (!) 141/64 (!) 155/80  Pulse:  86 75 76  Resp:  18 18 18   Temp:  97.9 F (36.6 C)    TempSrc:  Oral    SpO2:  99% 100% 100%  Weight: 103.4 kg     Height: 5\' 9"  (1.753 m)        Vitals:   12/05/20 0653 12/05/20 0658 12/05/20 0847 12/05/20 1000  BP:  (!) 153/77 (!) 141/64 (!) 155/80  Pulse:  86 75 76  Resp:  18 18 18   Temp:  97.9 F (36.6 C)    TempSrc:  Oral    SpO2:  99% 100% 100%  Weight: 103.4 kg     Height: 5\' 9"  (1.753 m)  Constitutional: Alert and oriented x 3 . Not in any apparent distress HEENT:      Head: Normocephalic and atraumatic.         Eyes: PERLA, EOMI, Conjunctivae are normal. Sclera is non-icteric.       Mouth/Throat: Mucous membranes are moist.       Neck: Supple with no signs of meningismus. Cardiovascular: Regular rate and rhythm. No murmurs, gallops, or rubs. 2+ symmetrical distal pulses are present . No JVD. No LE edema Respiratory: Respiratory effort normal .Lungs sounds clear bilaterally. No wheezes, crackles, or rhonchi.  Gastrointestinal: Soft, periumbilical tenderness, and non distended with hypoactive bowel sounds.  Genitourinary: No CVA tenderness. Musculoskeletal: Nontender with normal range of motion in all extremities. No cyanosis, or erythema of extremities. Neurologic:  Face is symmetric. Moving all extremities. No gross focal neurologic deficits . Skin: Skin is warm, dry.  No rash or ulcers Psychiatric: Mood and affect are normal   Labs on Admission: I have personally reviewed following labs and imaging studies  CBC: Recent Labs  Lab 12/05/20 0726  WBC 7.3  NEUTROABS 5.9  HGB 14.7  HCT 42.9  MCV 87.0  PLT 789   Basic Metabolic Panel: Recent Labs  Lab  12/05/20 0726  NA 139  K 3.9  CL 101  CO2 25  GLUCOSE 138*  BUN 14  CREATININE 1.05*  CALCIUM 10.1   GFR: Estimated Creatinine Clearance: 68.4 mL/min (A) (by C-G formula based on SCr of 1.05 mg/dL (H)). Liver Function Tests: Recent Labs  Lab 12/05/20 0726  AST 30  ALT 18  ALKPHOS 75  BILITOT 1.0  PROT 7.8  ALBUMIN 4.9   Recent Labs  Lab 12/05/20 0726  LIPASE 27   No results for input(s): AMMONIA in the last 168 hours. Coagulation Profile: No results for input(s): INR, PROTIME in the last 168 hours. Cardiac Enzymes: No results for input(s): CKTOTAL, CKMB, CKMBINDEX, TROPONINI in the last 168 hours. BNP (last 3 results) No results for input(s): PROBNP in the last 8760 hours. HbA1C: No results for input(s): HGBA1C in the last 72 hours. CBG: No results for input(s): GLUCAP in the last 168 hours. Lipid Profile: No results for input(s): CHOL, HDL, LDLCALC, TRIG, CHOLHDL, LDLDIRECT in the last 72 hours. Thyroid Function Tests: No results for input(s): TSH, T4TOTAL, FREET4, T3FREE, THYROIDAB in the last 72 hours. Anemia Panel: No results for input(s): VITAMINB12, FOLATE, FERRITIN, TIBC, IRON, RETICCTPCT in the last 72 hours. Urine analysis: No results found for: COLORURINE, APPEARANCEUR, LABSPEC, [place], GLUCOSEU, HGBUR, BILIRUBINUR, KETONESUR, PROTEINUR, UROBILINOGEN, NITRITE, LEUKOCYTESUR  Radiological Exams on Admission: CT ABDOMEN PELVIS WO CONTRAST  Result Date: 12/05/2020 CLINICAL DATA:  Epigastric pain, emesis EXAM: CT ABDOMEN AND PELVIS WITHOUT CONTRAST TECHNIQUE: Multidetector CT imaging of the abdomen and pelvis was performed following the standard protocol without IV contrast. COMPARISON:  None. FINDINGS: Lower chest: No acute abnormality. Hepatobiliary: No focal liver abnormality is seen. 1 cm gallstone in the neck. No wall thickening. No biliary dilatation. Pancreas: Unremarkable. Spleen: Unremarkable. Adrenals/Urinary Tract: Adrenals are unremarkable. Small  cyst of the lower pole the left kidney. Bladder is unremarkable. Stomach/Bowel: Small hiatal hernia. Stomach is otherwise unremarkable. Dilated, fluid-filled loops of mid to distal small bowel. Transition point is within the pelvis (series 5, image 81). Proximal to this point there is a fecalized segment of dilated ileum. Beyond this point, there is decompressed distal and terminal ileum. Stool is present throughout the colon. Vascular/Lymphatic: Aortic atherosclerosis.  No enlarged lymph nodes Reproductive: Status post  hysterectomy. No adnexal masses. Other: Small volume free fluid in the dependent pelvis. Mild mesenteric edema. Abdominal wall is unremarkable. Musculoskeletal: Degenerative changes of the lumbar spine. No acute osseous abnormality. IMPRESSION: Distal small-bowel obstruction with transition point within the pelvis. Cholelithiasis. Aortic atherosclerosis. Electronically Signed   By: Macy Mis M.D.   On: 12/05/2020 10:16     Assessment/Plan Principal Problem:   SBO (small bowel obstruction) (HCC) Active Problems:   Hypertension, essential   Anxiety   Bipolar disorder in remission ([place])      Small bowel obstruction Most likely secondary to adhesions from prior surgery, patient had a hysterectomy several years ago. Conservative management with n.p.o., IV fluid hydration, pain control, IV PPI and antiemetics We will consult surgery     Hypertension Blood pressure is uncontrolled due to pain Hold oral antihypertensives Place patient on IV hydralazine 10 mg every 6 hours for systolic blood pressure greater than 159mmHg    Bipolar disorder Hold anxiolytics and antidepressants for now  DVT prophylaxis: SCD Code Status: full code Family Communication: Greater than 50% of time was spent discussing patient's condition and plan of care with her at the bedside.  All questions and concerns have been addressed.  She verbalizes understanding and agrees to the plan. Disposition  Plan: Back to previous home environment Consults called: Surgery Status: At the time of admission, it appears that the appropriate admission status for this patient is inpatient. This discharge to be reasonable and necessary noted to provide the required intensity of service to ensure the patient's safety given the presenting symptoms, physical exam findings and initial radiographic and lab data in the context of the comorbid conditions. Patient requires inpatient status due to high intensity of service, high risk for further deterioration and high frequency of surveillance required.  Collier Bullock MD [facility]     12/05/2020, 12:11 PM

## 2020-12-05 NOTE — ED Notes (Signed)
Agbata, MD at bedside.

## 2020-12-05 NOTE — ED Notes (Signed)
Tamala Julian, MD at bedside assessing patient.

## 2020-12-05 NOTE — ED Notes (Signed)
Patient to Ct at this time

## 2020-12-05 NOTE — ED Notes (Signed)
Lindsay RN aware of assigned bed 
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
## 2020-12-05 NOTE — Progress Notes (Signed)
Mobility Specialist - Progress Note   12/05/20 1558  Mobility  Activity Ambulated to bathroom  Level of Assistance Standby assist, set-up cues, supervision of patient - no hands on  Assistive Device None  Distance Ambulated (ft) 20 ft  Mobility Out of bed for toileting  Mobility Response Tolerated well  Mobility performed by Mobility specialist  $Mobility charge 1 Mobility    Pt assisted to bathroom, no LOB. No AD, pushing IV pole. NGT clamped. Pt left on toilet, RN entered at exit.    Kathee Delton Mobility Specialist 12/05/20, 3:59 PM

## 2020-12-05 NOTE — ED Triage Notes (Signed)
Pt to triage via w/c, tearful; st awoke at 3am with N/V and mid abd radiating into lower abd; st hx hernia and is concerned because she "sprayed a lot of weed killer yesterday"

## 2020-12-06 ENCOUNTER — Inpatient Hospital Stay: Payer: Medicare HMO

## 2020-12-06 DIAGNOSIS — M797 Fibromyalgia: Secondary | ICD-10-CM

## 2020-12-06 DIAGNOSIS — F3170 Bipolar disorder, currently in remission, most recent episode unspecified: Secondary | ICD-10-CM

## 2020-12-06 DIAGNOSIS — F418 Other specified anxiety disorders: Secondary | ICD-10-CM | POA: Diagnosis not present

## 2020-12-06 DIAGNOSIS — I10 Essential (primary) hypertension: Secondary | ICD-10-CM

## 2020-12-06 DIAGNOSIS — K56609 Unspecified intestinal obstruction, unspecified as to partial versus complete obstruction: Secondary | ICD-10-CM | POA: Diagnosis not present

## 2020-12-06 LAB — BASIC METABOLIC PANEL
Anion gap: 12 (ref 5–15)
BUN: 18 mg/dL (ref 8–23)
CO2: 27 mmol/L (ref 22–32)
Calcium: 9.6 mg/dL (ref 8.9–10.3)
Chloride: 101 mmol/L (ref 98–111)
Creatinine, Ser: 0.89 mg/dL (ref 0.44–1.00)
GFR, Estimated: >60 mL/min (ref >60)
Glucose, Bld: 122 mg/dL — ABNORMAL HIGH (ref 70–99)
Potassium: 4 mmol/L (ref 3.5–5.1)
Sodium: 140 mmol/L (ref 135–145)

## 2020-12-06 LAB — CBC
HCT: 43.8 % (ref 36.0–46.0)
Hemoglobin: 14.6 g/dL (ref 12.0–15.0)
MCH: 29.1 pg (ref 26.0–34.0)
MCHC: 33.3 g/dL (ref 30.0–36.0)
MCV: 87.3 fL (ref 80.0–100.0)
Platelets: 201 10*3/uL (ref 150–400)
RBC: 5.02 MIL/uL (ref 3.87–5.11)
RDW: 13.1 % (ref 11.5–15.5)
WBC: 6.2 10*3/uL (ref 4.0–10.5)
nRBC: 0 % (ref 0.0–0.2)

## 2020-12-06 MED ORDER — HYDROMORPHONE HCL 1 MG/ML IJ SOLN
0.5000 mg | INTRAMUSCULAR | Status: DC | PRN
Start: 2020-12-06 — End: 2020-12-07
  Administered 2020-12-06 – 2020-12-07 (×4): 0.5 mg via INTRAVENOUS
  Filled 2020-12-06 (×5): qty 0.5

## 2020-12-06 MED ORDER — SODIUM CHLORIDE 0.9 % IV SOLN: INTRAVENOUS | Status: DC

## 2020-12-06 MED ORDER — HYDRALAZINE HCL 20 MG/ML IJ SOLN: 5.0000 mg | Freq: Four times a day (QID) | INTRAMUSCULAR | Status: DC | PRN

## 2020-12-06 MED ORDER — MORPHINE SULFATE (PF) 2 MG/ML IV SOLN
2.0000 mg | INTRAVENOUS | Status: DC | PRN
  Administered 2020-12-06 (×3): 2 mg via INTRAVENOUS
  Filled 2020-12-06 (×3): qty 1

## 2020-12-06 NOTE — Progress Notes (Signed)
Mobility Specialist - Progress Note   12/06/20 1300  Mobility  Activity Refused mobility  Mobility performed by Mobility specialist    Pt sleeping in bed upon arrival. Pt briefly awakens to voice and acknowledges mobility tech, then quickly falls back asleep, snoring lightly. Will attempt session another date/time.    Kathee Delton Mobility Specialist 12/06/20, 1:44 PM

## 2020-12-06 NOTE — Progress Notes (Signed)
   12/06/20 0700  Clinical Encounter Type  Visited With Patient  Visit Type Initial  Referral From Nurse  Consult/Referral To Chaplain  Spiritual Encounters  Spiritual Needs Brochure  Due to a high volume of AD's and OR's previously, I could not respond to an OR for an AD in room 2C-227 so I visited the Pt this morning. The pt. was resting, but she received the pamphlet and stated, "I will look at it later. I did not do an education and I advised the pt if she wants to complete the AD or wanted more information, she could give the on-call chaplain a call.

## 2020-12-06 NOTE — Progress Notes (Signed)
Patient ID: Gina Wallace, female   DOB: 14-Jul-1955, 65 y.o.   MRN: [id number] [facility] PROGRESS NOTE  Gina Wallace SNK:[id number] DOB: [birth year] DOA: 12/05/2020 PCP: Kirk Ruths, MD  HPI/Subjective: Patient in a lot of pain.  She stated she vomited last night despite having the NG tube in.  She states she only gets about an hour of relief with the morphine.  Pain is 10 out of 10 abdominal pain when I saw her this morning.  No diarrhea or bowel movements.  No passing gas.  Objective: Vitals:   12/06/20 0737 12/06/20 1114  BP: 139/78 138/72  Pulse: 89 85  Resp: 18 19  Temp: 99.3 F (37.4 C)   SpO2: 96% 98%    Intake/Output Summary (Last 24 hours) at 12/06/2020 1342 Last data filed at 12/06/2020 0840 Gross per 24 hour  Intake 165.42 ml  Output 2200 ml  Net -2034.58 ml   Filed Weights   12/05/20 0653  Weight: 103.4 kg    ROS: Review of Systems  Respiratory: Negative for shortness of breath.   Cardiovascular: Negative for chest pain.  Gastrointestinal: Positive for abdominal pain, nausea and vomiting.   Exam: Physical Exam HENT:     Head: Normocephalic.     Mouth/Throat:     Pharynx: No oropharyngeal exudate.  Eyes:     General: Lids are normal.     Conjunctiva/sclera: Conjunctivae normal.     Pupils: Pupils are equal, round, and reactive to light.  Cardiovascular:     Rate and Rhythm: Normal rate and regular rhythm.     Heart sounds: Normal heart sounds, S1 normal and S2 normal.  Pulmonary:     Breath sounds: No decreased breath sounds, wheezing, rhonchi or rales.  Abdominal:     Palpations: Abdomen is rigid.     Tenderness: There is abdominal tenderness.  Musculoskeletal:     Right ankle: No swelling.     Left ankle: No swelling.  Skin:    General: Skin is warm.     Findings: No rash.  Neurological:     Mental Status: She is alert and oriented to person, place, and time.       Data Reviewed: Basic Metabolic Panel: Recent  Labs  Lab 12/05/20 0726 12/06/20 0635  NA 139 140  K 3.9 4.0  CL 101 101  CO2 25 27  GLUCOSE 138* 122*  BUN 14 18  CREATININE 1.05* 0.89  CALCIUM 10.1 9.6   Liver Function Tests: Recent Labs  Lab 12/05/20 0726  AST 30  ALT 18  ALKPHOS 75  BILITOT 1.0  PROT 7.8  ALBUMIN 4.9   Recent Labs  Lab 12/05/20 0726  LIPASE 27   CBC: Recent Labs  Lab 12/05/20 0726 12/06/20 0635  WBC 7.3 6.2  NEUTROABS 5.9  --   HGB 14.7 14.6  HCT 42.9 43.8  MCV 87.0 87.3  PLT 200 201     Recent Results (from the past 240 hour(s))  SARS CORONAVIRUS 2 (TAT 6-24 HRS) Nasopharyngeal Nasopharyngeal Swab     Status: None   Collection Time: 12/05/20 10:35 AM   Specimen: Nasopharyngeal Swab  Result Value Ref Range Status   SARS Coronavirus 2 NEGATIVE NEGATIVE Final    Comment: (NOTE) SARS-CoV-2 target nucleic acids are NOT DETECTED.  The SARS-CoV-2 RNA is generally detectable in upper and lower respiratory specimens during the acute phase of infection. Negative results do not preclude SARS-CoV-2 infection, do not rule out co-infections with other pathogens, and  should not be used as the sole basis for treatment or other patient management decisions. Negative results must be combined with clinical observations, patient history, and epidemiological information. The expected result is Negative.  Fact Sheet for Patients: SugarRoll.be  Fact Sheet for Healthcare Providers: https://www.woods-mathews.com/  This test is not yet approved or cleared by the [place] FDA and  has been authorized for detection and/or diagnosis of SARS-CoV-2 by FDA under an Emergency Use Authorization (EUA). This EUA will remain  in effect (meaning this test can be used) for the duration of the COVID-19 declaration under Se ction 564(b)(1) of the Act, 21 U.S.C. section 360bbb-3(b)(1), unless the authorization is terminated or revoked sooner.  Performed at [facility], [place] [address] [place],  [address]      Studies: CT ABDOMEN PELVIS WO CONTRAST  Result Date: 12/05/2020 CLINICAL DATA:  Epigastric pain, emesis EXAM: CT ABDOMEN AND PELVIS WITHOUT CONTRAST TECHNIQUE: Multidetector CT imaging of the abdomen and pelvis was performed following the standard protocol without IV contrast. COMPARISON:  None. FINDINGS: Lower chest: No acute abnormality. Hepatobiliary: No focal liver abnormality is seen. 1 cm gallstone in the neck. No wall thickening. No biliary dilatation. Pancreas: Unremarkable. Spleen: Unremarkable. Adrenals/Urinary Tract: Adrenals are unremarkable. Small cyst of the lower pole the left kidney. Bladder is unremarkable. Stomach/Bowel: Small hiatal hernia. Stomach is otherwise unremarkable. Dilated, fluid-filled loops of mid to distal small bowel. Transition point is within the pelvis (series 5, image 81). Proximal to this point there is a fecalized segment of dilated ileum. Beyond this point, there is decompressed distal and terminal ileum. Stool is present throughout the colon. Vascular/Lymphatic: Aortic atherosclerosis.  No enlarged lymph nodes Reproductive: Status post hysterectomy. No adnexal masses. Other: Small volume free fluid in the dependent pelvis. Mild mesenteric edema. Abdominal wall is unremarkable. Musculoskeletal: Degenerative changes of the lumbar spine. No acute osseous abnormality. IMPRESSION: Distal small-bowel obstruction with transition point within the pelvis. Cholelithiasis. Aortic atherosclerosis. Electronically Signed   By: Macy Mis M.D.   On: 12/05/2020 10:16   DG Abd 1 View  Result Date: 12/05/2020 CLINICAL DATA:  NG tube placement. EXAM: ABDOMEN - 1 VIEW COMPARISON:  Earlier same day FINDINGS: 1657 hours. The tip of the NG tube is in the proximal to mid stomach. Proximal side port of the NG tube is at or just below the GE junction. Visualized upper abdomen has a nonspecific bowel gas pattern. IMPRESSION:  NG tube tip is in the proximal to mid stomach with proximal side port at or just below the GE junction. Tube could be advanced 3-4 cm to ensure proximal side port is below the GE junction. Electronically Signed   By: Misty Stanley M.D.   On: 12/05/2020 17:14   DG Abd 1 View  Result Date: 12/05/2020 CLINICAL DATA:  65 year old female status post NG placement. EXAM: ABDOMEN - 1 VIEW COMPARISON:  CT abdomen pelvis dated 12/05/2020. FINDINGS: Partially visualized enteric tube with side-port in the region of the GE junction. The tube fold back on itself at the level of the diaphragm with tip likely in the distal esophagus. Recommend retraction and repositioning. IMPRESSION: Partially visualized enteric tube folded in the region of the GE junction. Recommend retraction and repositioning. Electronically Signed   By: Anner Crete M.D.   On: 12/05/2020 15:42    Scheduled Meds: . pantoprazole (PROTONIX) IV  40 mg Intravenous Q24H   Continuous Infusions: . sodium chloride 75 mL/hr at 12/06/20 0735    Assessment/Plan:  1. Small bowel obstruction.  Patient having quite a bit of pain this morning when I saw her.  Change morphine over to IV Dilaudid.  With the patient having so much pain in her abdomen with more rigid and a lot of guarding I called the general surgeon. Case discussed with general surgery and patient will think about surgical versus conservative management.  Continue NG tube. 2. Depression, fibromyalgia, anxiety and bipolar disorder.  With NG tube have to hold medications at this point. 3. Essential hypertension.  With NG tube will have to do as needed hydralazine      Code Status:     Code Status Orders  (From admission, onward)         Start     Ordered   12/05/20 1118  Full code  Continuous        12/05/20 1119        Code Status History    This patient has a current code status but no historical code status.   Advance Care Planning Activity     Family Communication:  Patient declined me speaking with family Disposition Plan: Status is: Inpatient  Dispo: The patient is from: Home              Anticipated d/c is to: Home              Patient currently being treated for small bowel obstruction   Difficult to place patient.  No.  Consultants:  General surgery  Time spent: 28 minutes  [place]

## 2020-12-06 NOTE — Consult Note (Signed)
Subjective:   CC: SBO  HPI:  Gina Wallace is a 65 y.o. female who was consulted by [facility] for issue above.  Symptoms were first noted 1 day ago. Pain is sharp, intense, periumbilical.  Associated with n/v, exacerbated by nothing specific.  Last BM couple days ago.  Still denies any flatus or BM.   Past Medical History:  has a past medical history of ADHD (attention deficit hyperactivity disorder), Anxiety, Bipolar disorder ([place]), Carpal tunnel syndrome, Complication of anesthesia, Depression, Fibromyalgia, Hypertension, Macular degeneration, Vitamin D deficiency, and Wears dentures.  Past Surgical History:  Past Surgical History:  Procedure Laterality Date  . ABDOMINAL HYSTERECTOMY    . ABLATION    . ANTERIOR CRUCIATE LIGAMENT REPAIR Right   . BREAST BIOPSY Bilateral    All negative  . CESAREAN SECTION    . EYE SURGERY    . HALLUX VALGUS LAPIDUS Left 02/02/2018   Procedure: HALLUX VALGUS LAPIDUS;  Surgeon: Samara Deist, DPM;  Location: [place];  Service: Podiatry;  Laterality: Left;  general w popliteal block lapiplasty set  . HAMMER TOE SURGERY Left 02/02/2018   Procedure: HAMMER TOE CORRECTION - T1 & T2;  Surgeon: Samara Deist, DPM;  Location: [place];  Service: Podiatry;  Laterality: Left;  . THYROIDECTOMY Right    left lobe remains  . TONSILLECTOMY AND ADENOIDECTOMY    . WEIL OSTEOTOMY Left 02/02/2018   Procedure: WEIL OSTEOTOMY X 2, REVERSE [place], AND Barbie Banner;  Surgeon: Samara Deist, DPM;  Location: [place];  Service: Podiatry;  Laterality: Left;    Family History: family history includes Alcohol abuse in her maternal uncle; Anxiety disorder in her mother; Breast cancer in her maternal aunt and maternal grandmother; Breast cancer (age of onset: 96) in her mother; Depression in her mother.  Social History:  reports that she quit smoking about 12 years ago. Her smoking use included cigarettes. She has never used smokeless tobacco.  She reports previous alcohol use. She reports that she does not use drugs.  Current Medications:  Prior to Admission medications   Medication Sig Start Date End Date Taking? Authorizing Provider  amphetamine-dextroamphetamine (ADDERALL) 20 MG tablet Take 1 tablet (20 mg total) by mouth 3 (three) times daily. 12/01/20  Yes Thayer Headings, PMHNP  ARIPiprazole (ABILIFY) 10 MG tablet Take 1 tablet (10 mg total) by mouth daily. 11/13/20  Yes Thayer Headings, PMHNP  buPROPion (WELLBUTRIN XL) 300 MG 24 hr tablet Take 1 tablet (300 mg total) by mouth daily. 10/17/20  Yes Thayer Headings, PMHNP  Cholecalciferol (VITAMIN D) 50 MCG (2000 UT) tablet Take 2,000 Units by mouth daily.   Yes [provider]  lamoTRIgine (LAMICTAL) 150 MG tablet Take 150 mg by mouth daily.   Yes [provider]  lisinopril (ZESTRIL) 20 MG tablet Take 40 mg by mouth daily. 01/26/19  Yes [provider]  oxyCODONE-acetaminophen (PERCOCET) 10-325 MG tablet Take 1 tablet by mouth every 8 (eight) hours as needed for pain. Must last 30 days. 11/14/20 12/14/20 Yes Gillis Santa, MD  polyethylene glycol (MIRALAX / GLYCOLAX) 17 g packet Take 17 g by mouth daily as needed for moderate constipation.   Yes [provider]  Probiotic Product (PROBIOTIC PO) Take 2 tablets by mouth daily.   Yes [provider]  tiZANidine (ZANAFLEX) 4 MG tablet Take 4 mg by mouth at bedtime as needed for muscle spasms. 10/05/20  Yes [provider]  traZODone (DESYREL) 150 MG tablet TAKE TWO TO THREE TABLETS BY  MOUTH AT [facility] BEDTIME Patient taking differently: Take 450 mg by mouth at bedtime. 08/29/20  Yes Thayer Headings, PMHNP  amphetamine-dextroamphetamine (ADDERALL) 20 MG tablet Take 1 tablet (20 mg total) by mouth 3 (three) times daily. Patient not taking: No sig reported 11/03/20   Thayer Headings, PMHNP  amphetamine-dextroamphetamine (ADDERALL) 20 MG tablet Take 1 tablet (20 mg total) by mouth 3 (three) times  daily. 10/06/20 11/05/20  Thayer Headings, PMHNP  Cyanocobalamin (VITAMIN B 12 PO) Take by mouth. Patient not taking: No sig reported    [provider]  oxyCODONE-acetaminophen (PERCOCET) 10-325 MG tablet Take 1 tablet by mouth every 8 (eight) hours as needed for pain. Must last 30 days. 12/14/20 01/13/21  Gillis Santa, MD    Allergies:  Allergies as of 12/05/2020  . (No Known Allergies)    ROS:  General: Denies weight loss, weight gain, fatigue, fevers, chills, and night sweats. Eyes: Denies blurry vision, double vision, eye pain, itchy eyes, and tearing. Ears: Denies hearing loss, earache, and ringing in ears. Nose: Denies sinus pain, congestion, infections, runny nose, and nosebleeds. Mouth/throat: Denies hoarseness, sore throat, bleeding gums, and difficulty swallowing. Heart: Denies chest pain, palpitations, racing heart, irregular heartbeat, leg pain or swelling, and decreased activity tolerance. Respiratory: Denies breathing difficulty, shortness of breath, wheezing, cough, and sputum. GI: Denies change in appetite, heartburn, constipation, diarrhea, and blood in stool. GU: Denies difficulty urinating, pain with urinating, urgency, frequency, blood in urine. Musculoskeletal: Denies joint stiffness, pain, swelling, muscle weakness. Skin: Denies rash, itching, mass, tumors, sores, and boils Neurologic: Denies headache, fainting, dizziness, seizures, numbness, and tingling. Psychiatric: Denies depression, anxiety, difficulty sleeping, and memory loss. Endocrine: Denies heat or cold intolerance, and increased thirst or urination. Blood/lymph: Denies easy bruising, easy bruising, and swollen glands     Objective:     BP 138/72 (BP Location: Left Arm)   Pulse 85   Temp 99.3 F (37.4 C) (Oral)   Resp 19   Ht 5\' 9"  (1.753 m)   Wt 103.4 kg   SpO2 98%   BMI 33.67 kg/m   Constitutional :  alert, cooperative, appears stated age and no distress  Lymphatics/Throat:  no  asymmetry, masses, or scars  Respiratory:  clear to auscultation bilaterally  Cardiovascular:  regular rate and rhythm  Gastrointestinal: soft, some guarding and moderate TTP in periumbilical area.   Musculoskeletal: Steady movement  Skin: Cool and moist  Psychiatric: Normal affect, non-agitated, not confused       LABS:  CMP Latest Ref Rng & Units 12/06/2020 12/05/2020 11/14/2020  Glucose 70 - 99 mg/dL 122(H) 138(H) 102(H)  BUN 8 - 23 mg/dL 18 14 23   Creatinine 0.44 - 1.00 mg/dL 0.89 1.05(H) 1.23(H)  Sodium 135 - 145 mmol/L 140 139 137  Potassium 3.5 - 5.1 mmol/L 4.0 3.9 4.4  Chloride 98 - 111 mmol/L 101 101 106  CO2 22 - 32 mmol/L 27 25 23   Calcium 8.9 - 10.3 mg/dL 9.6 10.1 9.4  Total Protein 6.5 - 8.1 g/dL - 7.8 7.1  Total Bilirubin 0.3 - 1.2 mg/dL - 1.0 0.6  Alkaline Phos 38 - 126 U/L - 75 59  AST 15 - 41 U/L - 30 22  ALT 0 - 44 U/L - 18 14   CBC Latest Ref Rng & Units 12/06/2020 12/05/2020 11/14/2020  WBC 4.0 - 10.5 K/uL 6.2 7.3 5.3  Hemoglobin 12.0 - 15.0 g/dL 14.6 14.7 14.3  Hematocrit 36.0 - 46.0 % 43.8 42.9 42.6  Platelets 150 - 400  K/uL 201 200 200    RADS: CLINICAL DATA:  Epigastric pain, emesis  EXAM: CT ABDOMEN AND PELVIS WITHOUT CONTRAST  TECHNIQUE: Multidetector CT imaging of the abdomen and pelvis was performed following the standard protocol without IV contrast.  COMPARISON:  None.  FINDINGS: Lower chest: No acute abnormality.  Hepatobiliary: No focal liver abnormality is seen. 1 cm gallstone in the neck. No wall thickening. No biliary dilatation.  Pancreas: Unremarkable.  Spleen: Unremarkable.  Adrenals/Urinary Tract: Adrenals are unremarkable. Small cyst of the lower pole the left kidney. Bladder is unremarkable.  Stomach/Bowel: Small hiatal hernia. Stomach is otherwise unremarkable. Dilated, fluid-filled loops of mid to distal small bowel. Transition point is within the pelvis (series 5, image 81). Proximal to this point there is a  fecalized segment of dilated ileum. Beyond this point, there is decompressed distal and terminal ileum. Stool is present throughout the colon.  Vascular/Lymphatic: Aortic atherosclerosis.  No enlarged lymph nodes  Reproductive: Status post hysterectomy. No adnexal masses.  Other: Small volume free fluid in the dependent pelvis. Mild mesenteric edema. Abdominal wall is unremarkable.  Musculoskeletal: Degenerative changes of the lumbar spine. No acute osseous abnormality.  IMPRESSION: Distal small-bowel obstruction with transition point within the pelvis.  Cholelithiasis.  Aortic atherosclerosis.   Electronically Signed   By: Macy Mis M.D.   On: 12/05/2020 10:16  Assessment:   SBO, recurrent Likely adhesions from hysteretomy  Plan:   Discussed pathophisiology and treatment options including NG tube decompression and possible surgery for lysis of adhesions, laparoscopic or open.  The risk of surgery include, but not limited to, recurrence, bleeding, chronic pain, post-op infxn, post-op SBO or ileus, hernias, resection of bowel, re-anastamosis, possible ostomy placement and need for re-operation to address said risks. The risks of general anesthetic, if used, includes MI, CVA, sudden death or even reaction to anesthetic medications also discussed. Alternatives include continued observation and NG decompression.  Benefits include possible symptom relief, preventing further decline in health and possible death.  Typical post-op recovery time of additional days in hospital for observation afterwards also discussed.  The patient verbalized understanding and all questions were answered to the patient's satisfaction.  Due to intensity of pain reported as well as the CT images that I personally reviewed, my initial recommendation was to proceed with robotic assisted diagnostic laparoscopy, possible lysis of adhesions, possible small bowel resection. Pt stated that she  will like to discuss with husband first prior to making final decision.  I stated I will be available to proceed with the procedure if needed.  Recommend continuing NPO, pain control, NG tube decompression, and encouraged ambulation in the meantime.

## 2020-12-06 NOTE — Progress Notes (Signed)
   12/06/20 0930  Clinical Encounter Type  Visited With Patient  Visit Type Initial;Spiritual support;Social support  Referral From Nurse  Consult/Referral To Chaplain   Chaplain visited PT and attempted to do AD, as requested by PT. The PT stated this is not a good day, and she that she wanted to be left alone. Chaplain told PT that if she decides to complete an AD, to have her nurse contact the on-call chaplain.

## 2020-12-07 DIAGNOSIS — F418 Other specified anxiety disorders: Secondary | ICD-10-CM | POA: Diagnosis not present

## 2020-12-07 DIAGNOSIS — E876 Hypokalemia: Secondary | ICD-10-CM

## 2020-12-07 DIAGNOSIS — I10 Essential (primary) hypertension: Secondary | ICD-10-CM | POA: Diagnosis not present

## 2020-12-07 DIAGNOSIS — F3170 Bipolar disorder, currently in remission, most recent episode unspecified: Secondary | ICD-10-CM | POA: Diagnosis not present

## 2020-12-07 DIAGNOSIS — K56609 Unspecified intestinal obstruction, unspecified as to partial versus complete obstruction: Secondary | ICD-10-CM | POA: Diagnosis not present

## 2020-12-07 LAB — BASIC METABOLIC PANEL
Anion gap: 8 (ref 5–15)
BUN: 19 mg/dL (ref 8–23)
CO2: 26 mmol/L (ref 22–32)
Calcium: 8.7 mg/dL — ABNORMAL LOW (ref 8.9–10.3)
Chloride: 108 mmol/L (ref 98–111)
Creatinine, Ser: 0.79 mg/dL (ref 0.44–1.00)
GFR, Estimated: >60 mL/min (ref >60)
Glucose, Bld: 89 mg/dL (ref 70–99)
Potassium: 3.4 mmol/L — ABNORMAL LOW (ref 3.5–5.1)
Sodium: 142 mmol/L (ref 135–145)

## 2020-12-07 MED ORDER — OXYCODONE HCL 5 MG PO TABS
5.0000 mg | ORAL_TABLET | Freq: Four times a day (QID) | ORAL | Status: DC | PRN
  Administered 2020-12-07: 5 mg via ORAL
  Filled 2020-12-07: qty 1

## 2020-12-07 MED ORDER — LAMOTRIGINE 25 MG PO TABS
150.0000 mg | ORAL_TABLET | Freq: Every day | ORAL | Status: DC
  Administered 2020-12-07 – 2020-12-09 (×3): 150 mg via ORAL
  Filled 2020-12-07 (×4): qty 1

## 2020-12-07 MED ORDER — ARIPIPRAZOLE 10 MG PO TABS
10.0000 mg | ORAL_TABLET | Freq: Every day | ORAL | Status: DC
  Administered 2020-12-07 – 2020-12-09 (×3): 10 mg via ORAL
  Filled 2020-12-07 (×3): qty 1

## 2020-12-07 MED ORDER — TIZANIDINE HCL 4 MG PO TABS
4.0000 mg | ORAL_TABLET | Freq: Every evening | ORAL | Status: DC | PRN
  Administered 2020-12-08: 4 mg via ORAL
  Filled 2020-12-07 (×3): qty 1

## 2020-12-07 MED ORDER — TRAZODONE HCL 50 MG PO TABS
450.0000 mg | ORAL_TABLET | Freq: Every day | ORAL | Status: DC
  Administered 2020-12-07 – 2020-12-08 (×2): 450 mg via ORAL
  Filled 2020-12-07 (×3): qty 1

## 2020-12-07 MED ORDER — HYDROMORPHONE HCL 1 MG/ML IJ SOLN
0.5000 mg | INTRAMUSCULAR | Status: DC | PRN
  Administered 2020-12-07: 0.5 mg via INTRAVENOUS

## 2020-12-07 MED ORDER — POTASSIUM CHLORIDE IN NACL 20-0.9 MEQ/L-% IV SOLN
INTRAVENOUS | Status: DC
  Filled 2020-12-07 (×5): qty 1000

## 2020-12-07 MED ORDER — BUPROPION HCL ER (XL) 150 MG PO TB24
300.0000 mg | ORAL_TABLET | Freq: Every day | ORAL | Status: DC
  Administered 2020-12-07 – 2020-12-09 (×3): 300 mg via ORAL
  Filled 2020-12-07 (×3): qty 2

## 2020-12-07 NOTE — Progress Notes (Signed)
Mobility Specialist - Progress Note   12/07/20 1700  Mobility  Activity Refused mobility  Mobility performed by Mobility specialist    Pt declined mobility this date, no reason specified. States she's up to ambulating today, but would prefer to wait until after a shower. Will attempt session another date/time.   Kathee Delton Mobility Specialist 12/07/20, 5:02 PM

## 2020-12-07 NOTE — Progress Notes (Signed)
Patient ID: Gina Wallace, female   DOB: [birth year], 65 y.o.   MRN: [id number] [facility] PROGRESS NOTE  Gina Wallace [id number] DOB: 03/22/1956 DOA: 12/05/2020 PCP: Kirk Ruths, MD  HPI/Subjective: Patient feeling better today than yesterday.  Still having some abdominal soreness but a lot better than yesterday.  No nausea or vomiting.  Had a bunch of bowel movements last night.  She has a history of chronic constipation.  She went off her diet at a family reunion and had some issues.  Objective: Vitals:   12/07/20 0910 12/07/20 1126  BP: (!) 155/65 (!) 158/68  Pulse: 80 84  Resp: 16 18  Temp: 98.1 F (36.7 C) 98.3 F (36.8 C)  SpO2: 100% 99%    Intake/Output Summary (Last 24 hours) at 12/07/2020 1253 Last data filed at 12/07/2020 0910 Gross per 24 hour  Intake 1395.21 ml  Output 581 ml  Net 814.21 ml   Filed Weights   12/05/20 0653  Weight: 103.4 kg    ROS: Review of Systems  Respiratory: Negative for shortness of breath.   Cardiovascular: Negative for chest pain.  Gastrointestinal: Positive for abdominal pain. Negative for nausea and vomiting.   Exam: Physical Exam HENT:     Head: Normocephalic.     Mouth/Throat:     Pharynx: No oropharyngeal exudate.  Eyes:     General: Lids are normal.     Conjunctiva/sclera: Conjunctivae normal.     Pupils: Pupils are equal, round, and reactive to light.  Cardiovascular:     Rate and Rhythm: Normal rate and regular rhythm.     Heart sounds: Normal heart sounds, S1 normal and S2 normal.  Pulmonary:     Breath sounds: Normal breath sounds. No decreased breath sounds, wheezing, rhonchi or rales.  Abdominal:     Palpations: Abdomen is soft.     Tenderness: There is generalized abdominal tenderness.  Musculoskeletal:     Right ankle: No swelling.     Left ankle: No swelling.  Skin:    General: Skin is warm.     Findings: No rash.  Neurological:     Mental Status: She is alert and oriented  to person, place, and time.       Data Reviewed: Basic Metabolic Panel: Recent Labs  Lab 12/05/20 0726 12/06/20 0635 12/07/20 0551  NA 139 140 142  K 3.9 4.0 3.4*  CL 101 101 108  CO2 25 27 26   GLUCOSE 138* 122* 89  BUN 14 18 19   CREATININE 1.05* 0.89 0.79  CALCIUM 10.1 9.6 8.7*   Liver Function Tests: Recent Labs  Lab 12/05/20 0726  AST 30  ALT 18  ALKPHOS 75  BILITOT 1.0  PROT 7.8  ALBUMIN 4.9   Recent Labs  Lab 12/05/20 0726  LIPASE 27   CBC: Recent Labs  Lab 12/05/20 0726 12/06/20 0635  WBC 7.3 6.2  NEUTROABS 5.9  --   HGB 14.7 14.6  HCT 42.9 43.8  MCV 87.0 87.3  PLT 200 201     Recent Results (from the past 240 hour(s))  SARS CORONAVIRUS 2 (TAT 6-24 HRS) Nasopharyngeal Nasopharyngeal Swab     Status: None   Collection Time: 12/05/20 10:35 AM   Specimen: Nasopharyngeal Swab  Result Value Ref Range Status   SARS Coronavirus 2 NEGATIVE NEGATIVE Final    Comment: (NOTE) SARS-CoV-2 target nucleic acids are NOT DETECTED.  The SARS-CoV-2 RNA is generally detectable in upper and lower respiratory specimens during the acute phase of  infection. Negative results do not preclude SARS-CoV-2 infection, do not rule out co-infections with other pathogens, and should not be used as the sole basis for treatment or other patient management decisions. Negative results must be combined with clinical observations, patient history, and epidemiological information. The expected result is Negative.  Fact Sheet for Patients: SugarRoll.be  Fact Sheet for Healthcare Providers: https://www.woods-mathews.com/  This test is not yet approved or cleared by the [place] FDA and  has been authorized for detection and/or diagnosis of SARS-CoV-2 by FDA under an Emergency Use Authorization (EUA). This EUA will remain  in effect (meaning this test can be used) for the duration of the COVID-19 declaration under Se ction  564(b)(1) of the Act, 21 U.S.C. section 360bbb-3(b)(1), unless the authorization is terminated or revoked sooner.  Performed at [facility], [place] [address] [place], [place] [address]      Studies: DG Abd 1 View  Result Date: 12/05/2020 CLINICAL DATA:  NG tube placement. EXAM: ABDOMEN - 1 VIEW COMPARISON:  Earlier same day FINDINGS: 1657 hours. The tip of the NG tube is in the proximal to mid stomach. Proximal side port of the NG tube is at or just below the GE junction. Visualized upper abdomen has a nonspecific bowel gas pattern. IMPRESSION: NG tube tip is in the proximal to mid stomach with proximal side port at or just below the GE junction. Tube could be advanced 3-4 cm to ensure proximal side port is below the GE junction. Electronically Signed   By: Misty Stanley M.D.   On: 12/05/2020 17:14   DG Abd 1 View  Result Date: 12/05/2020 CLINICAL DATA:  65 year old female status post NG placement. EXAM: ABDOMEN - 1 VIEW COMPARISON:  CT abdomen pelvis dated 12/05/2020. FINDINGS: Partially visualized enteric tube with side-port in the region of the GE junction. The tube fold back on itself at the level of the diaphragm with tip likely in the distal esophagus. Recommend retraction and repositioning. IMPRESSION: Partially visualized enteric tube folded in the region of the GE junction. Recommend retraction and repositioning. Electronically Signed   By: Anner Crete M.D.   On: 12/05/2020 15:42   DG Abd Portable 2V  Result Date: 12/06/2020 CLINICAL DATA:  Abdominal pain EXAM: PORTABLE ABDOMEN - 2 VIEW COMPARISON:  12/05/2020 FINDINGS: Nonobstructive pattern of bowel gas, with gas and stool present to the rectum. Esophagogastric tube remains in position with tip below the diaphragm, side port at approximately the level of the gastroesophageal junction. No free air in the abdomen on supine radiographs. IMPRESSION: 1. Nonobstructive pattern of bowel gas, with gas and stool present to the  rectum. 2. Esophagogastric tube remains in position with tip below the diaphragm, side port at approximately the level of the gastroesophageal junction. As on prior examinations, this could be advanced several cm to ensure subdiaphragmatic positioning of both the tip and side port. Electronically Signed   By: Eddie Candle M.D.   On: 12/06/2020 14:21    Scheduled Meds: . ARIPiprazole  10 mg Oral Daily  . buPROPion  300 mg Oral Daily  . lamoTRIgine  150 mg Oral Daily  . pantoprazole (PROTONIX) IV  40 mg Intravenous Q24H  . traZODone  450 mg Oral QHS   Continuous Infusions: . 0.9 % NaCl with KCl 20 mEq / L 75 mL/hr at 12/07/20 0855    Assessment/Plan:  1. Small bowel obstruction.  Improved from yesterday.  Still having abdominal pain but less than yesterday.  Patient had  NG tube in this morning and did well with a clamping trial and likely will have NG tube removed soon and clear liquid started.  Continue as needed pain medications and nausea medications.  Patient had numerous bowel movements overnight. 2. Depression, fibromyalgia, anxiety and bipolar disorder.  Restart Abilify, Wellbutrin, lamotrigine and trazodone. 3. Essential hypertension.  We will continue to hold lisinopril at this point but if blood pressure trends higher than it is now can restart. 4. Hypokalemia added potassium to the IV fluids.     Code Status:     Code Status Orders  (From admission, onward)         Start     Ordered   12/05/20 1118  Full code  Continuous        12/05/20 1119        Code Status History    This patient has a current code status but no historical code status.   Advance Care Planning Activity     Family Communication: Deferred Disposition Plan: Status is: Inpatient  Dispo: The patient is from: Home              Anticipated d/c is to: Home              Patient currently being treated for small bowel   Difficult to place patient.  No.  Consultants:  General surgery  Time  spent: 27 minutes  [place]

## 2020-12-07 NOTE — Progress Notes (Signed)
Subjective:  CC: Gina Wallace is a 65 y.o. female  Hospital stay day 2,   SBO  HPI: Mult BMs overnight, pain is slowly improving now as well  ROS:  General: Denies weight loss, weight gain, fatigue, fevers, chills, and night sweats. Heart: Denies chest pain, palpitations, racing heart, irregular heartbeat, leg pain or swelling, and decreased activity tolerance. Respiratory: Denies breathing difficulty, shortness of breath, wheezing, cough, and sputum. GI: Denies change in appetite, heartburn, nausea, vomiting, constipation, diarrhea, and blood in stool. GU: Denies difficulty urinating, pain with urinating, urgency, frequency, blood in urine.   Objective:   Temp:  [97.7 F (36.5 C)-98.6 F (37 C)] 98.1 F (36.7 C) (05/28 0910) Pulse Rate:  [80-88] 80 (05/28 0910) Resp:  [16-20] 16 (05/28 0910) BP: (137-155)/(63-76) 155/65 (05/28 0910) SpO2:  [96 %-100 %] 100 % (05/28 0910)     Height: 5\' 9"  (175.3 cm) Weight: 103.4 kg BMI (Calculated): 33.65   Intake/Output this shift:   Intake/Output Summary (Last 24 hours) at 12/07/2020 1047 Last data filed at 12/07/2020 0602 Gross per 24 hour  Intake 1395.21 ml  Output 541 ml  Net 854.21 ml    Constitutional :  alert, cooperative, appears stated age and no distress  Respiratory:  clear to auscultation bilaterally  Cardiovascular:  regular rate and rhythm  Gastrointestinal: soft, no guarding, some TTP in suprapubic and LLQ, but improved since last exam.   Skin: Cool and moist.   Psychiatric: Normal affect, non-agitated, not confused       LABS:  CMP Latest Ref Rng & Units 12/07/2020 12/06/2020 12/05/2020  Glucose 70 - 99 mg/dL 89 122(H) 138(H)  BUN 8 - 23 mg/dL 19 18 14   Creatinine 0.44 - 1.00 mg/dL 0.79 0.89 1.05(H)  Sodium 135 - 145 mmol/L 142 140 139  Potassium 3.5 - 5.1 mmol/L 3.4(L) 4.0 3.9  Chloride 98 - 111 mmol/L 108 101 101  CO2 22 - 32 mmol/L 26 27 25   Calcium 8.9 - 10.3 mg/dL 8.7(L) 9.6 10.1  Total Protein 6.5 -  8.1 g/dL - - 7.8  Total Bilirubin 0.3 - 1.2 mg/dL - - 1.0  Alkaline Phos 38 - 126 U/L - - 75  AST 15 - 41 U/L - - 30  ALT 0 - 44 U/L - - 18   CBC Latest Ref Rng & Units 12/06/2020 12/05/2020 11/14/2020  WBC 4.0 - 10.5 K/uL 6.2 7.3 5.3  Hemoglobin 12.0 - 15.0 g/dL 14.6 14.7 14.3  Hematocrit 36.0 - 46.0 % 43.8 42.9 42.6  Platelets 150 - 400 K/uL 201 200 200    RADS: n/a Assessment:   SBO, clinically resolving.  Will proceed with clamp trial and ADAT if minimal residual.

## 2020-12-07 NOTE — Progress Notes (Signed)
Patient has had more than 5 bowel movements today, pain is relieved and feels less intense. Is passing some gas. Little NG tube gastric content output, bowel sounds active.

## 2020-12-07 NOTE — Progress Notes (Signed)
NG tube clamped x3 hours with scant residual. Order to d/c NG and start clear liquid diet.

## 2020-12-08 DIAGNOSIS — R197 Diarrhea, unspecified: Secondary | ICD-10-CM | POA: Diagnosis not present

## 2020-12-08 DIAGNOSIS — I10 Essential (primary) hypertension: Secondary | ICD-10-CM | POA: Diagnosis not present

## 2020-12-08 DIAGNOSIS — E876 Hypokalemia: Secondary | ICD-10-CM | POA: Diagnosis not present

## 2020-12-08 DIAGNOSIS — K56609 Unspecified intestinal obstruction, unspecified as to partial versus complete obstruction: Secondary | ICD-10-CM | POA: Diagnosis not present

## 2020-12-08 LAB — URINALYSIS, COMPLETE (UACMP) WITH MICROSCOPIC
Bacteria, UA: NONE SEEN
Bilirubin Urine: NEGATIVE
Glucose, UA: NEGATIVE mg/dL
Ketones, ur: NEGATIVE mg/dL
Leukocytes,Ua: NEGATIVE
Nitrite: NEGATIVE
Protein, ur: NEGATIVE mg/dL
Specific Gravity, Urine: 1.015 (ref 1.005–1.030)
pH: 5 (ref 5.0–8.0)

## 2020-12-08 LAB — BASIC METABOLIC PANEL
Anion gap: 5 (ref 5–15)
BUN: 11 mg/dL (ref 8–23)
CO2: 24 mmol/L (ref 22–32)
Calcium: 8.2 mg/dL — ABNORMAL LOW (ref 8.9–10.3)
Chloride: 114 mmol/L — ABNORMAL HIGH (ref 98–111)
Creatinine, Ser: 0.66 mg/dL (ref 0.44–1.00)
GFR, Estimated: >60 mL/min (ref >60)
Glucose, Bld: 85 mg/dL (ref 70–99)
Potassium: 4.4 mmol/L (ref 3.5–5.1)
Sodium: 143 mmol/L (ref 135–145)

## 2020-12-08 LAB — MAGNESIUM: Magnesium: 1.7 mg/dL (ref 1.7–2.4)

## 2020-12-08 MED ORDER — MAGNESIUM SULFATE 2 GM/50ML IV SOLN
2.0000 g | Freq: Once | INTRAVENOUS | Status: AC
  Administered 2020-12-08: 2 g via INTRAVENOUS
  Filled 2020-12-08: qty 50

## 2020-12-08 MED ORDER — LISINOPRIL 20 MG PO TABS
20.0000 mg | ORAL_TABLET | Freq: Every day | ORAL | Status: DC
  Administered 2020-12-08 – 2020-12-09 (×2): 20 mg via ORAL
  Filled 2020-12-08 (×2): qty 1

## 2020-12-08 NOTE — Progress Notes (Signed)
Patient ID: Gina Wallace, female   DOB: Oct 26, 1955, 65 y.o.   MRN: [id number] [facility] PROGRESS NOTE  Amesha Bailey Kassem VQM:[id number] DOB: November 08, 1955 DOA: 12/05/2020 PCP: Kirk Ruths, MD  HPI/Subjective: Patient states that she had uncontrollable diarrhea.  Still having some abdominal pain but nowhere near the level of pain that she had prior to coming in.  Admitted with small bowel obstruction.  Patient states that her urine has been a little dark.  Objective: Vitals:   12/08/20 0804 12/08/20 1115  BP: (!) 146/55 (!) 146/75  Pulse: 85 72  Resp: 18 18  Temp: 98.8 F (37.1 C) 98 F (36.7 C)  SpO2: 97% 100%    Intake/Output Summary (Last 24 hours) at 12/08/2020 1224 Last data filed at 12/08/2020 0408 Gross per 24 hour  Intake 1430.31 ml  Output --  Net 1430.31 ml   Filed Weights   12/05/20 0653  Weight: 103.4 kg    ROS: Review of Systems  Respiratory: Negative for cough and shortness of breath.   Cardiovascular: Negative for chest pain.  Gastrointestinal: Positive for abdominal pain and diarrhea. Negative for nausea and vomiting.   Exam: Physical Exam HENT:     Head: Normocephalic.     Mouth/Throat:     Pharynx: No oropharyngeal exudate.  Eyes:     General: Lids are normal.     Conjunctiva/sclera: Conjunctivae normal.     Pupils: Pupils are equal, round, and reactive to light.  Cardiovascular:     Rate and Rhythm: Normal rate and regular rhythm.     Heart sounds: Normal heart sounds, S1 normal and S2 normal.  Pulmonary:     Breath sounds: Normal breath sounds. No decreased breath sounds, wheezing, rhonchi or rales.  Abdominal:     Palpations: Abdomen is soft.     Tenderness: There is generalized abdominal tenderness.  Musculoskeletal:     Right lower leg: No swelling.     Left lower leg: No swelling.  Skin:    General: Skin is warm.     Findings: No rash.  Neurological:     Mental Status: She is alert and oriented to person, place,  and time.       Data Reviewed: Basic Metabolic Panel: Recent Labs  Lab 12/05/20 0726 12/06/20 0635 12/07/20 0551 12/08/20 0627  NA 139 140 142 143  K 3.9 4.0 3.4* 4.4  CL 101 101 108 114*  CO2 25 27 26 24   GLUCOSE 138* 122* 89 85  BUN 14 18 19 11   CREATININE 1.05* 0.89 0.79 0.66  CALCIUM 10.1 9.6 8.7* 8.2*  MG  --   --   --  1.7   Liver Function Tests: Recent Labs  Lab 12/05/20 0726  AST 30  ALT 18  ALKPHOS 75  BILITOT 1.0  PROT 7.8  ALBUMIN 4.9   Recent Labs  Lab 12/05/20 0726  LIPASE 27   CBC: Recent Labs  Lab 12/05/20 0726 12/06/20 0635  WBC 7.3 6.2  NEUTROABS 5.9  --   HGB 14.7 14.6  HCT 42.9 43.8  MCV 87.0 87.3  PLT 200 201     Recent Results (from the past 240 hour(s))  SARS CORONAVIRUS 2 (TAT 6-24 HRS) Nasopharyngeal Nasopharyngeal Swab     Status: None   Collection Time: 12/05/20 10:35 AM   Specimen: Nasopharyngeal Swab  Result Value Ref Range Status   SARS Coronavirus 2 NEGATIVE NEGATIVE Final    Comment: (NOTE) SARS-CoV-2 target nucleic acids are NOT DETECTED.  The SARS-CoV-2 RNA is generally detectable in upper and lower respiratory specimens during the acute phase of infection. Negative results do not preclude SARS-CoV-2 infection, do not rule out co-infections with other pathogens, and should not be used as the sole basis for treatment or other patient management decisions. Negative results must be combined with clinical observations, patient history, and epidemiological information. The expected result is Negative.  Fact Sheet for Patients: SugarRoll.be  Fact Sheet for Healthcare Providers: https://www.woods-mathews.com/  This test is not yet approved or cleared by the [place] FDA and  has been authorized for detection and/or diagnosis of SARS-CoV-2 by FDA under an Emergency Use Authorization (EUA). This EUA will remain  in effect (meaning this test can be used) for the  duration of the COVID-19 declaration under Se ction 564(b)(1) of the Act, 21 U.S.C. section 360bbb-3(b)(1), unless the authorization is terminated or revoked sooner.  Performed at [facility], [place] [address] [place], [place] [address]       Scheduled Meds: . ARIPiprazole  10 mg Oral Daily  . buPROPion  300 mg Oral Daily  . lamoTRIgine  150 mg Oral Daily  . pantoprazole (PROTONIX) IV  40 mg Intravenous Q24H  . traZODone  450 mg Oral QHS    Assessment/Plan:  1. Small bowel obstruction.  Still having some abdominal pain but not much improved since she is when she came in.  Patient had numerous episodes of uncontrolled diarrhea today.  Could not even make it to the bathroom in time.  Advancing diet to solid food.  We will monitor today and hopefully be able to go home tomorrow.  Encourage ambulation today. 2. Depression, anxiety, bipolar disorder and fibromyalgia.  Continue Abilify Wellbutrin lamotrigine and trazodone 3. Essential hypertension restart lisinopril 20 mg daily since blood pressure now little bit on the higher side. 4. Hypomagnesemia.  Replace magnesium 2 g IV once today. 5. Hypokalemia replaced from yesterday up into the normal range.  Patient wanted to stop IV fluids since she is now on solid food.  We will discontinue IV fluids 6. Dark urine.  Send off a urine analysis.        Code Status:     Code Status Orders  (From admission, onward)         Start     Ordered   12/05/20 1118  Full code  Continuous        12/05/20 1119        Code Status History    This patient has a current code status but no historical code status.   Advance Care Planning Activity     Disposition Plan: Status is: Inpatient  Dispo: The patient is from: Home              Anticipated d/c is to: Potentially home tomorrow if not having uncontrollable diarrhea.              Patient currently doing better with regards to small bowel obstruction but had uncontrollable diarrhea  today.   Difficult to place patient.  No.  Consultants:  General surgery  Time spent: 28 minutes  [place]

## 2020-12-08 NOTE — Progress Notes (Signed)
Subjective:  CC: Gina Wallace is a 65 y.o. female  Hospital stay day 3,   SBO  HPI: No new issues.  Continues to have mult BMs.  ROS:  General: Denies weight loss, weight gain, fatigue, fevers, chills, and night sweats. Heart: Denies chest pain, palpitations, racing heart, irregular heartbeat, leg pain or swelling, and decreased activity tolerance. Respiratory: Denies breathing difficulty, shortness of breath, wheezing, cough, and sputum. GI: Denies change in appetite, heartburn, nausea, vomiting, constipation, diarrhea, and blood in stool. GU: Denies difficulty urinating, pain with urinating, urgency, frequency, blood in urine.   Objective:   Temp:  [98 F (36.7 C)-98.8 F (37.1 C)] 98 F (36.7 C) (05/29 1115) Pulse Rate:  [72-85] 72 (05/29 1115) Resp:  [16-18] 18 (05/29 1115) BP: (146-151)/(55-77) 146/75 (05/29 1115) SpO2:  [97 %-100 %] 100 % (05/29 1115)     Height: 5\' 9"  (175.3 cm) Weight: 103.4 kg BMI (Calculated): 33.65   Intake/Output this shift:   Intake/Output Summary (Last 24 hours) at 12/08/2020 1200 Last data filed at 12/08/2020 0408 Gross per 24 hour  Intake 1430.31 ml  Output --  Net 1430.31 ml    Constitutional :  alert, cooperative, appears stated age and no distress  Respiratory:  clear to auscultation bilaterally  Cardiovascular:  regular rate and rhythm  Gastrointestinal: soft, non-tender; bowel sounds normal; no masses,  no organomegaly.   Skin: Cool and moist.   Psychiatric: Normal affect, non-agitated, not confused       LABS:  CMP Latest Ref Rng & Units 12/08/2020 12/07/2020 12/06/2020  Glucose 70 - 99 mg/dL 85 89 122(H)  BUN 8 - 23 mg/dL 11 19 18   Creatinine 0.44 - 1.00 mg/dL 0.66 0.79 0.89  Sodium 135 - 145 mmol/L 143 142 140  Potassium 3.5 - 5.1 mmol/L 4.4 3.4(L) 4.0  Chloride 98 - 111 mmol/L 114(H) 108 101  CO2 22 - 32 mmol/L 24 26 27   Calcium 8.9 - 10.3 mg/dL 8.2(L) 8.7(L) 9.6  Total Protein 6.5 - 8.1 g/dL - - -  Total Bilirubin  0.3 - 1.2 mg/dL - - -  Alkaline Phos 38 - 126 U/L - - -  AST 15 - 41 U/L - - -  ALT 0 - 44 U/L - - -   CBC Latest Ref Rng & Units 12/06/2020 12/05/2020 11/14/2020  WBC 4.0 - 10.5 K/uL 6.2 7.3 5.3  Hemoglobin 12.0 - 15.0 g/dL 14.6 14.7 14.3  Hematocrit 36.0 - 46.0 % 43.8 42.9 42.6  Platelets 150 - 400 K/uL 201 200 200    RADS: n/a Assessment:   SBO, clinically resolved.  Surgery to sign off.  Call with additional questions.  No f/u needed as an outpt

## 2020-12-09 DIAGNOSIS — R319 Hematuria, unspecified: Secondary | ICD-10-CM

## 2020-12-09 DIAGNOSIS — F3170 Bipolar disorder, currently in remission, most recent episode unspecified: Secondary | ICD-10-CM | POA: Diagnosis not present

## 2020-12-09 DIAGNOSIS — F418 Other specified anxiety disorders: Secondary | ICD-10-CM | POA: Diagnosis not present

## 2020-12-09 DIAGNOSIS — I10 Essential (primary) hypertension: Secondary | ICD-10-CM | POA: Diagnosis not present

## 2020-12-09 DIAGNOSIS — K56609 Unspecified intestinal obstruction, unspecified as to partial versus complete obstruction: Secondary | ICD-10-CM | POA: Diagnosis not present

## 2020-12-09 LAB — BASIC METABOLIC PANEL
Anion gap: 6 (ref 5–15)
BUN: 7 mg/dL — ABNORMAL LOW (ref 8–23)
CO2: 25 mmol/L (ref 22–32)
Calcium: 8.6 mg/dL — ABNORMAL LOW (ref 8.9–10.3)
Chloride: 111 mmol/L (ref 98–111)
Creatinine, Ser: 0.66 mg/dL (ref 0.44–1.00)
GFR, Estimated: >60 mL/min (ref >60)
Glucose, Bld: 105 mg/dL — ABNORMAL HIGH (ref 70–99)
Potassium: 3.1 mmol/L — ABNORMAL LOW (ref 3.5–5.1)
Sodium: 142 mmol/L (ref 135–145)

## 2020-12-09 LAB — CBC
HCT: 32.2 % — ABNORMAL LOW (ref 36.0–46.0)
Hemoglobin: 10.9 g/dL — ABNORMAL LOW (ref 12.0–15.0)
MCH: 29.7 pg (ref 26.0–34.0)
MCHC: 33.9 g/dL (ref 30.0–36.0)
MCV: 87.7 fL (ref 80.0–100.0)
Platelets: 164 10*3/uL (ref 150–400)
RBC: 3.67 MIL/uL — ABNORMAL LOW (ref 3.87–5.11)
RDW: 12.8 % (ref 11.5–15.5)
WBC: 3.7 10*3/uL — ABNORMAL LOW (ref 4.0–10.5)
nRBC: 0 % (ref 0.0–0.2)

## 2020-12-09 LAB — MAGNESIUM: Magnesium: 2 mg/dL (ref 1.7–2.4)

## 2020-12-09 MED ORDER — POTASSIUM CHLORIDE CRYS ER 20 MEQ PO TBCR
40.0000 meq | EXTENDED_RELEASE_TABLET | Freq: Once | ORAL | Status: AC
  Administered 2020-12-09: 40 meq via ORAL
  Filled 2020-12-09: qty 2

## 2020-12-09 NOTE — Discharge Summary (Signed)
[facility] [place] NAME: Gina Wallace    MR#:  [id number]  DATE OF BIRTH:  18-Mar-1956  DATE OF ADMISSION:  12/05/2020 ADMITTING PHYSICIAN: Collier Bullock, MD  DATE OF DISCHARGE: 12/09/2020 11:24 AM  PRIMARY CARE PHYSICIAN: Kirk Ruths, MD    ADMISSION DIAGNOSIS:  SBO (small bowel obstruction) ([facility]) [K56.609] Generalized abdominal pain [R10.84] Bilious vomiting with nausea [R11.14]  DISCHARGE DIAGNOSIS:  Principal Problem:   SBO (small bowel obstruction) ([facility]) Active Problems:   Hypertension, essential   Anxiety   Bipolar disorder in remission ([facility])   Depression with anxiety   Hypokalemia   Diarrhea   Hypomagnesemia   SECONDARY DIAGNOSIS:   Past Medical History:  Diagnosis Date  . ADHD (attention deficit hyperactivity disorder)   . Anxiety   . Bipolar disorder ([facility] [facility])   . Carpal tunnel syndrome   . Complication of anesthesia    pt requires larger doses of meds for effect  . Depression   . Fibromyalgia   . Hypertension   . Macular degeneration   . Vitamin D deficiency   . Wears dentures    full upper and lower    HOSPITAL COURSE:   1.  Small bowel obstruction.  Patient had a severe abdominal pain on presentation.  This is much improved.  Patient had numerous episodes of bowel movements and diarrhea.  Stool has started to form up.  She tolerated advance diet.  She was seen by general surgery.  This has improved. 2.  Depression, anxiety, bipolar disorder and fibromyalgia.  Continue the patient's Abilify, Wellbutrin, lamotrigine and trazodone. 3.  Essential hypertension.  Resume lisinopril. 4.  Hypomagnesemia.  Magnesium replaced into the normal range 5.  Hypokalemia little low today we will give an oral supplementation prior to discharge.  Now that she is on solid food this likely will not be an issue. 6.  Hematuria.  Will refer to urology as outpatient.  CT scan of the abdomen reviewed and no comments on  kidney stone but did comment on a small cyst of the lower pole of the left kidney.  Bladder unremarkable.  DISCHARGE CONDITIONS:   Satisfactory  CONSULTS OBTAINED:  Treatment Team:  Benjamine Sprague, DO  DRUG ALLERGIES:  No Known Allergies  DISCHARGE MEDICATIONS:   Allergies as of 12/09/2020   No Known Allergies     Medication List    TAKE these medications   amphetamine-dextroamphetamine 20 MG tablet Commonly known as: Adderall Take 1 tablet (20 mg total) by mouth 3 (three) times daily. What changed: Another medication with the same name was removed. Continue taking this medication, and follow the directions you see here.   ARIPiprazole 10 MG tablet Commonly known as: Abilify Take 1 tablet (10 mg total) by mouth daily.   buPROPion 300 MG 24 hr tablet Commonly known as: Wellbutrin XL Take 1 tablet (300 mg total) by mouth daily.   lamoTRIgine 150 MG tablet Commonly known as: LAMICTAL Take 150 mg by mouth daily.   lisinopril 20 MG tablet Commonly known as: ZESTRIL Take 40 mg by mouth daily.   oxyCODONE-acetaminophen 10-325 MG tablet Commonly known as: Percocet Take 1 tablet by mouth every 8 (eight) hours as needed for pain. Must last 30 days. What changed: Another medication with the same name was removed. Continue taking this medication, and follow the directions you see here.   polyethylene glycol 17 g packet Commonly known as: MIRALAX / GLYCOLAX Take 17 g by mouth daily as  needed for moderate constipation.   PROBIOTIC PO Take 2 tablets by mouth daily.   tiZANidine 4 MG tablet Commonly known as: ZANAFLEX Take 4 mg by mouth at bedtime as needed for muscle spasms.   traZODone 150 MG tablet Commonly known as: DESYREL TAKE TWO TO THREE TABLETS BY MOUTH AT EACH BEDTIME What changed:   how much to take  how to take this  when to take this  additional instructions   Vitamin D 50 MCG (2000 UT) tablet Take 2,000 Units by mouth daily.        DISCHARGE  INSTRUCTIONS:   Follow-up PMD 5 days Refer to urology as outpatient  If you experience worsening of your admission symptoms, develop shortness of breath, life threatening emergency, suicidal or homicidal thoughts you must seek medical attention immediately by calling 911 or calling your MD immediately  if symptoms less severe.  You Must read complete instructions/literature along with all the possible adverse reactions/side effects for all the Medicines you take and that have been prescribed to you. Take any new Medicines after you have completely understood and accept all the possible adverse reactions/side effects.   Please note  You were cared for by a hospitalist during your hospital stay. If you have any questions about your discharge medications or the care you received while you were in the hospital after you are discharged, you can call the unit and asked to speak with the hospitalist on call if the hospitalist that took care of you is not available. Once you are discharged, your primary care physician will handle any further medical issues. Please note that NO REFILLS for any discharge medications will be authorized once you are discharged, as it is imperative that you return to your primary care physician (or establish a relationship with a primary care physician if you do not have one) for your aftercare needs so that they can reassess your need for medications and monitor your lab values.    Today   CHIEF COMPLAINT:   Chief Complaint  Patient presents with  . Abdominal Pain    HISTORY OF PRESENT ILLNESS:  Gina Wallace  is a 65 y.o. female came in with abdominal pain and found to have a small bowel obstruction   VITAL SIGNS:  Blood pressure (!) 156/63, pulse 72, temperature 97.9 F (36.6 C), resp. rate 18, height 5\' 9"  (1.753 m), weight 103.4 kg, SpO2 100 %.  I/O:    Intake/Output Summary (Last 24 hours) at 12/09/2020 1606 Last data filed at 12/09/2020 0900 Gross per  24 hour  Intake 292.48 ml  Output --  Net 292.48 ml    PHYSICAL EXAMINATION:  GENERAL:  65 y.o.-year-old patient lying in the bed with no acute distress.  EYES: Pupils equal, round, reactive to light and accommodation. No scleral icterus. Extraocular muscles intact.  HEENT: Head atraumatic, normocephalic. Oropharynx and nasopharynx clear.   LUNGS: Normal breath sounds bilaterally, no wheezing, rales,rhonchi or crepitation. No use of accessory muscles of respiration.  CARDIOVASCULAR: S1, S2 normal. No murmurs, rubs, or gallops.  ABDOMEN: Soft, non-tender, non-distended. Bowel sounds present. No organomegaly or mass.  EXTREMITIES: No pedal edema.  NEUROLOGIC: Cranial nerves II through XII are intact. Muscle strength 5/5 in all extremities. Sensation intact. Gait not checked.  PSYCHIATRIC: The patient is alert and oriented x 3.  SKIN: No obvious rash, lesion, or ulcer.   DATA REVIEW:   CBC Recent Labs  Lab 12/09/20 0549  WBC 3.7*  HGB 10.9*  HCT 32.2*  PLT 164    Chemistries  Recent Labs  Lab 12/05/20 0726 12/06/20 0635 12/09/20 0549  NA 139   < > 142  K 3.9   < > 3.1*  CL 101   < > 111  CO2 25   < > 25  GLUCOSE 138*   < > 105*  BUN 14   < > 7*  CREATININE 1.05*   < > 0.66  CALCIUM 10.1   < > 8.6*  MG  --    < > 2.0  AST 30  --   --   ALT 18  --   --   ALKPHOS 75  --   --   BILITOT 1.0  --   --    < > = values in this interval not displayed.      Microbiology Results  Results for orders placed or performed during the hospital encounter of 12/05/20  SARS CORONAVIRUS 2 (TAT 6-24 HRS) Nasopharyngeal Nasopharyngeal Swab     Status: None   Collection Time: 12/05/20 10:35 AM   Specimen: Nasopharyngeal Swab  Result Value Ref Range Status   SARS Coronavirus 2 NEGATIVE NEGATIVE Final    Comment: (NOTE) SARS-CoV-2 target nucleic acids are NOT DETECTED.  The SARS-CoV-2 RNA is generally detectable in upper and lower respiratory specimens during the acute phase of  infection. Negative results do not preclude SARS-CoV-2 infection, do not rule out co-infections with other pathogens, and should not be used as the sole basis for treatment or other patient management decisions. Negative results must be combined with clinical observations, patient history, and epidemiological information. The expected result is Negative.  Fact Sheet for Patients: SugarRoll.be  Fact Sheet for Healthcare Providers: https://www.woods-mathews.com/  This test is not yet approved or cleared by the [place] FDA and  has been authorized for detection and/or diagnosis of SARS-CoV-2 by FDA under an Emergency Use Authorization (EUA). This EUA will remain  in effect (meaning this test can be used) for the duration of the COVID-19 declaration under Se ction 564(b)(1) of the Act, 21 U.S.C. section 360bbb-3(b)(1), unless the authorization is terminated or revoked sooner.  Performed at [facility], [place] [address] [place], [place] [address]      Management plans discussed with the patient, and she is agreement.  CODE STATUS:     Code Status Orders  (From admission, onward)         Start     Ordered   12/05/20 1118  Full code  Continuous        12/05/20 1119        Code Status History    This patient has a current code status but no historical code status.   Advance Care Planning Activity      TOTAL TIME TAKING CARE OF THIS PATIENT: 32 minutes.    Loletha Grayer M.D on 12/09/2020 at 4:06 PM  [facility]  CC: Primary care physician; Kirk Ruths, MD

## 2020-12-09 NOTE — Discharge Instructions (Signed)
Bowel Obstruction A bowel obstruction means that something is blocking the small or large bowel. The bowel is also called the intestine. It is the long tube that connects the stomach to the opening of the butt (anus). When something blocks the bowel, food and fluids cannot pass through like normal. This condition needs to be treated. Treatment depends on the cause of the problem and how bad the problem is. What are the causes? Common causes of this condition include:  Scar tissue (adhesions) from past surgery or from high-energy X-rays (radiation).  Recent surgery in the belly. This affects how food moves in the bowel.  Some diseases, such as: ? Irritation of the lining of the digestive tract (Crohn's disease). ? Irritation of small pouches in the bowel (diverticulitis).  Growths or tumors.  A bulging organ (hernia).  Twisting of the bowel (volvulus).  A foreign body.  Slipping of a part of the bowel into another part (intussusception).   What are the signs or symptoms? Symptoms of this condition include:  Pain in the belly.  Feeling sick to your stomach (nauseous).  Throwing up (vomiting).  Bloating in the belly.  Being unable to pass gas.  Trouble pooping (constipation).  Watery poop (diarrhea).  A lot of belching. How is this diagnosed? This condition may be diagnosed based on:  A physical exam.  Medical history.  Imaging tests, such as X-ray or CT scan.  Blood tests.  Urine tests. How is this treated? Treatment for this condition may include:  Fluids and pain medicines that are given through an IV tube. Your doctor may tell you not to eat or drink if you feel sick to your stomach and are throwing up.  Eating a clear liquid diet for a few days.  Putting a small tube (nasogastric tube) into the stomach. This will help with pain, discomfort, and nausea by removing blocked air and fluids from the stomach.  Surgery. This may be needed if other treatments do  not work. Follow these instructions at home: Medicines  Take over-the-counter and prescription medicines only as told by your doctor.  If you were prescribed an antibiotic medicine, take it as told by your doctor. Do not stop taking the antibiotic even if you start to feel better. General instructions  Follow your diet as told by your doctor. You may need to: ? Only drink clear liquids until you start to get better. ? Avoid solid foods.  Return to your normal activities as told by your doctor. Ask your doctor what activities are safe for you.  Do not sit for a long time without moving. Get up to take short walks every 1-2 hours. This is important. Ask for help if you feel weak or unsteady.  Keep all follow-up visits as told by your doctor. This is important. How is this prevented? After having a bowel obstruction, you may be more likely to have another. You can do some things to stop it from happening again.  If you have a long-term (chronic) disease, contact your doctor if you see changes or problems.  Take steps to prevent or treat trouble pooping. Your doctor may ask that you: ? Drink enough fluid to keep your pee (urine) pale yellow. ? Take over-the-counter or prescription medicines. ? Eat foods that are high in fiber. These include beans, whole grains, and fresh fruits and vegetables. ? Limit foods that are high in fat and sugar. These include fried or sweet foods.  Stay active. Ask your doctor which   exercises are safe for you.  Avoid stress.  Eat three small meals and three small snacks each day.  Work with a food expert (dietitian) to make a meal plan that works for you.  Do not use any products that contain nicotine or tobacco, such as cigarettes and e-cigarettes. If you need help quitting, ask your doctor.   Contact a doctor if:  You have a fever.  You have chills. Get help right away if:  You have pain or cramps that get worse.  You throw up blood.  You are  sick to your stomach.  You cannot stop throwing up.  You cannot drink fluids.  You feel mixed up (confused).  You feel very thirsty (dehydrated).  Your belly gets more bloated.  You feel weak or you pass out (faint). Summary  A bowel obstruction means that something is blocking the small or large bowel.  Treatment may include IV fluids and pain medicine. You may also have a clear liquid diet, a small tube in your stomach, or surgery.  Drink clear liquids and avoid solid foods until you get better. This information is not intended to replace advice given to you by your health care provider. Make sure you discuss any questions you have with your health care provider. Document Revised: 11/10/2017 Document Reviewed: 11/10/2017 Elsevier Patient Education  2021 [employer]vier Inc.  

## 2020-12-09 NOTE — Plan of Care (Signed)
VSS, patient alert and oriented.  No IV present, discharge instructions will be reviewed prior to discharge, husband will pick patient up.

## 2020-12-18 DIAGNOSIS — Z0100 Encounter for examination of eyes and vision without abnormal findings: Secondary | ICD-10-CM | POA: Diagnosis not present

## 2021-01-01 ENCOUNTER — Encounter: Payer: Self-pay | Admitting: Psychiatry

## 2021-01-01 ENCOUNTER — Telehealth (INDEPENDENT_AMBULATORY_CARE_PROVIDER_SITE_OTHER): Payer: Medicare HMO | Admitting: Psychiatry

## 2021-01-01 DIAGNOSIS — F901 Attention-deficit hyperactivity disorder, predominantly hyperactive type: Secondary | ICD-10-CM | POA: Diagnosis not present

## 2021-01-01 DIAGNOSIS — G4700 Insomnia, unspecified: Secondary | ICD-10-CM

## 2021-01-01 DIAGNOSIS — F3130 Bipolar disorder, current episode depressed, mild or moderate severity, unspecified: Secondary | ICD-10-CM

## 2021-01-01 MED ORDER — ARIPIPRAZOLE 10 MG PO TABS
10.0000 mg | ORAL_TABLET | Freq: Every day | ORAL | 1 refills | Status: DC
Start: 2021-01-01 — End: 2021-06-13

## 2021-01-01 MED ORDER — LAMOTRIGINE 150 MG PO TABS: 150.0000 mg | ORAL_TABLET | Freq: Every day | ORAL | 1 refills | Status: DC

## 2021-01-01 MED ORDER — TRAZODONE HCL 150 MG PO TABS: 450.0000 mg | ORAL_TABLET | Freq: Every day | ORAL | 2 refills | Status: DC

## 2021-01-01 MED ORDER — AMPHETAMINE-DEXTROAMPHETAMINE 20 MG PO TABS: 20.0000 mg | ORAL_TABLET | Freq: Three times a day (TID) | ORAL | 0 refills | Status: DC

## 2021-01-01 MED ORDER — BUPROPION HCL ER (XL) 300 MG PO TB24: 300.0000 mg | ORAL_TABLET | Freq: Every day | ORAL | 0 refills | Status: DC
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
## 2021-01-07 ENCOUNTER — Other Ambulatory Visit: Payer: Self-pay | Admitting: Student in an Organized Health Care Education/Training Program

## 2021-01-07 ENCOUNTER — Telehealth: Payer: Self-pay

## 2021-01-07 NOTE — Telephone Encounter (Signed)
error 

## 2021-01-09 ENCOUNTER — Encounter: Payer: Medicare HMO | Admitting: Student in an Organized Health Care Education/Training Program

## 2021-01-14 ENCOUNTER — Ambulatory Visit
Payer: Medicare HMO | Attending: Student in an Organized Health Care Education/Training Program | Admitting: Student in an Organized Health Care Education/Training Program

## 2021-01-14 ENCOUNTER — Encounter: Payer: Self-pay | Admitting: Student in an Organized Health Care Education/Training Program

## 2021-01-14 ENCOUNTER — Other Ambulatory Visit: Payer: Self-pay

## 2021-01-14 VITALS — BP 127/70 | HR 93 | Temp 96.7°F | Resp 16 | Ht 68.0 in | Wt 216.0 lb

## 2021-01-14 DIAGNOSIS — Z87891 Personal history of nicotine dependence: Secondary | ICD-10-CM | POA: Diagnosis not present

## 2021-01-14 DIAGNOSIS — G44321 Chronic post-traumatic headache, intractable: Secondary | ICD-10-CM | POA: Diagnosis not present

## 2021-01-14 DIAGNOSIS — M7918 Myalgia, other site: Secondary | ICD-10-CM

## 2021-01-14 DIAGNOSIS — G894 Chronic pain syndrome: Secondary | ICD-10-CM | POA: Diagnosis not present

## 2021-01-14 DIAGNOSIS — F325 Major depressive disorder, single episode, in full remission: Secondary | ICD-10-CM | POA: Insufficient documentation

## 2021-01-14 DIAGNOSIS — S12100G Unspecified displaced fracture of second cervical vertebra, subsequent encounter for fracture with delayed healing: Secondary | ICD-10-CM | POA: Diagnosis not present

## 2021-01-14 DIAGNOSIS — S12110S Anterior displaced Type II dens fracture, sequela: Secondary | ICD-10-CM | POA: Diagnosis not present

## 2021-01-14 DIAGNOSIS — Z79891 Long term (current) use of opiate analgesic: Secondary | ICD-10-CM | POA: Diagnosis not present

## 2021-01-14 DIAGNOSIS — M797 Fibromyalgia: Secondary | ICD-10-CM | POA: Diagnosis not present

## 2021-01-14 DIAGNOSIS — Y92410 Unspecified street and highway as the place of occurrence of the external cause: Secondary | ICD-10-CM | POA: Insufficient documentation

## 2021-01-14 DIAGNOSIS — M542 Cervicalgia: Secondary | ICD-10-CM

## 2021-01-14 DIAGNOSIS — Z79899 Other long term (current) drug therapy: Secondary | ICD-10-CM | POA: Insufficient documentation

## 2021-01-14 MED ORDER — OXYCODONE-ACETAMINOPHEN 10-325 MG PO TABS: 1.0000 | ORAL_TABLET | Freq: Three times a day (TID) | ORAL | 0 refills | Status: AC | PRN
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
## 2021-01-14 NOTE — Progress Notes (Signed)
Nursing Pain Medication Assessment:  Safety precautions to be maintained throughout the outpatient stay will include: orient to surroundings, keep bed in low position, maintain call bell within reach at all times, provide assistance with transfer out of bed and ambulation.  Medication Inspection Compliance: Gina Wallace did not comply with our request to bring her pills to be counted. She was reminded that bringing the medication bottles, even when empty, is a requirement.  Medication: None brought in. Pill/Patch Count: None available to be counted. Bottle Appearance: No container available. Did not bring bottle(s) to appointment. Filled Date: N/A Last Medication intake:  Today

## 2021-01-20 DIAGNOSIS — H353113 Nonexudative age-related macular degeneration, right eye, advanced atrophic without subfoveal involvement: Secondary | ICD-10-CM | POA: Diagnosis not present

## 2021-02-26 ENCOUNTER — Telehealth: Payer: Self-pay | Admitting: Psychiatry

## 2021-02-26 NOTE — Telephone Encounter (Signed)
She will need to wait 4 weeks before refilling. She can look on her current bottle to see which mfg is not working. Her pharmacy should also be able to tell her what was dispensed in the past.

## 2021-02-26 NOTE — Telephone Encounter (Signed)
When I spoke to her she said she does not have a old bottle to know which manufacturer she took.She was willing to pay out of pocket for a Rx and take med back to pharmacy if you agree.If not I will relay message

## 2021-02-26 NOTE — Telephone Encounter (Signed)
Lvm to return call

## 2021-02-26 NOTE — Telephone Encounter (Signed)
Next visit is 03/27/21. Gina Wallace picked up her Adderall RX and noticed that the color and shape of the pills looks different. The pharmacist told her that it is the manufacturer that changes the pills. She states that since she has been taking the pills they do not help her as much as the original pills did. Could someone please call her at [phone] regarding this.

## 2021-02-26 NOTE — Telephone Encounter (Signed)
Pt stated she picked up rx on 8/7.Because the manufacturer changed the med is not working for her.She has problems focusing and can't function.She has changed her pharmacy and wants to know if she could get a new Rx.

## 2021-02-26 NOTE — Telephone Encounter (Signed)
Sept 4th would be the earliest she could fill it since it was picked up on 02/16/21. The next script can be re-sent to specify which manufacturer to dispense or not to dispense if she can let us know which manufacturers are most effective for her.

## 2021-02-26 NOTE — Telephone Encounter (Signed)
Pt will call back with manufacturer

## 2021-03-13 ENCOUNTER — Telehealth: Payer: Self-pay | Admitting: Student in an Organized Health Care Education/Training Program

## 2021-03-13 ENCOUNTER — Telehealth: Payer: Self-pay | Admitting: Psychiatry

## 2021-03-13 NOTE — Telephone Encounter (Signed)
Due 9/7

## 2021-03-13 NOTE — Telephone Encounter (Signed)
Next visit is 03/27/21. Requesting refill on Adderall 20 mg called to her new pharmacy.   [facility], [address], [place], [place] [address]. Phone number is [phone]. She said 3 prescriptions are called in for her at one time.

## 2021-03-18 ENCOUNTER — Other Ambulatory Visit: Payer: Self-pay

## 2021-03-18 DIAGNOSIS — F901 Attention-deficit hyperactivity disorder, predominantly hyperactive type: Secondary | ICD-10-CM

## 2021-03-18 MED ORDER — AMPHETAMINE-DEXTROAMPHETAMINE 20 MG PO TABS: 20.0000 mg | ORAL_TABLET | Freq: Three times a day (TID) | ORAL | 0 refills | Status: DC

## 2021-03-18 NOTE — Telephone Encounter (Signed)
Pended.

## 2021-03-19 ENCOUNTER — Telehealth: Payer: Self-pay | Admitting: Psychiatry

## 2021-03-19 ENCOUNTER — Other Ambulatory Visit: Payer: Self-pay

## 2021-03-19 DIAGNOSIS — F901 Attention-deficit hyperactivity disorder, predominantly hyperactive type: Secondary | ICD-10-CM

## 2021-03-19 MED ORDER — AMPHETAMINE-DEXTROAMPHETAMINE 20 MG PO TABS: 20.0000 mg | ORAL_TABLET | Freq: Three times a day (TID) | ORAL | 0 refills | Status: DC

## 2021-03-19 NOTE — Telephone Encounter (Signed)
Gina Wallace called because the [facility] doesn't have the Adderall.  The [facility] on [address] in [place] does have Brand Adderall.  Please cancel her prescription at [facility] and send a new prescriptions to [facility] for Brand [facility]derall.  I did tell Gina Wallace [employer] may reject Brand.  But because of the shortage she wants to try.

## 2021-03-19 NOTE — Telephone Encounter (Signed)
Cancelled and pended  

## 2021-03-20 ENCOUNTER — Other Ambulatory Visit: Payer: Self-pay

## 2021-03-20 DIAGNOSIS — F901 Attention-deficit hyperactivity disorder, predominantly hyperactive type: Secondary | ICD-10-CM

## 2021-03-20 MED ORDER — AMPHETAMINE-DEXTROAMPHETAMINE 20 MG PO TABS: 20.0000 mg | ORAL_TABLET | Freq: Three times a day (TID) | ORAL | 0 refills | Status: DC

## 2021-03-20 NOTE — Telephone Encounter (Signed)
Cancelled and pended  

## 2021-03-20 NOTE — Telephone Encounter (Signed)
Vivien Rota, I hope you are following along on where it's suppose to be.

## 2021-03-20 NOTE — Telephone Encounter (Signed)
[facility] to [facility]  Pt's request. She will wait on back order there.

## 2021-03-20 NOTE — Telephone Encounter (Signed)
Duplicate message. 

## 2021-03-24 ENCOUNTER — Telehealth: Payer: Self-pay

## 2021-03-24 NOTE — Telephone Encounter (Signed)
Patient called.  She will pick her meds up from [facility].  She will let us know if the script is not there.

## 2021-03-24 NOTE — Telephone Encounter (Signed)
Pt have been out of oxycodone for 3 days please send to the new pharmacy

## 2021-03-27 ENCOUNTER — Telehealth (INDEPENDENT_AMBULATORY_CARE_PROVIDER_SITE_OTHER): Payer: Medicare HMO | Admitting: Psychiatry

## 2021-03-27 ENCOUNTER — Encounter: Payer: Self-pay | Admitting: Psychiatry

## 2021-03-27 DIAGNOSIS — F3130 Bipolar disorder, current episode depressed, mild or moderate severity, unspecified: Secondary | ICD-10-CM

## 2021-03-27 DIAGNOSIS — F901 Attention-deficit hyperactivity disorder, predominantly hyperactive type: Secondary | ICD-10-CM | POA: Diagnosis not present

## 2021-03-27 MED ORDER — BUPROPION HCL ER (XL) 300 MG PO TB24: 300.0000 mg | ORAL_TABLET | Freq: Every day | ORAL | 0 refills | Status: DC

## 2021-03-27 MED ORDER — AMPHETAMINE-DEXTROAMPHETAMINE 20 MG PO TABS: 20.0000 mg | ORAL_TABLET | Freq: Three times a day (TID) | ORAL | 0 refills | Status: DC

## 2021-03-27 MED ORDER — LAMOTRIGINE 100 MG PO TABS
150.0000 mg | ORAL_TABLET | Freq: Every day | ORAL | 2 refills | Status: DC
Start: 2021-03-27 — End: 2021-04-22
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
## 2021-04-01 DIAGNOSIS — I10 Essential (primary) hypertension: Secondary | ICD-10-CM | POA: Diagnosis not present

## 2021-04-01 DIAGNOSIS — F909 Attention-deficit hyperactivity disorder, unspecified type: Secondary | ICD-10-CM | POA: Diagnosis not present

## 2021-04-01 DIAGNOSIS — Z0000 Encounter for general adult medical examination without abnormal findings: Secondary | ICD-10-CM | POA: Diagnosis not present

## 2021-04-01 DIAGNOSIS — F319 Bipolar disorder, unspecified: Secondary | ICD-10-CM | POA: Diagnosis not present

## 2021-04-01 DIAGNOSIS — Z1231 Encounter for screening mammogram for malignant neoplasm of breast: Secondary | ICD-10-CM | POA: Diagnosis not present

## 2021-04-01 DIAGNOSIS — Z1389 Encounter for screening for other disorder: Secondary | ICD-10-CM | POA: Diagnosis not present

## 2021-04-02 ENCOUNTER — Other Ambulatory Visit: Payer: Self-pay | Admitting: Internal Medicine

## 2021-04-02 DIAGNOSIS — Z1231 Encounter for screening mammogram for malignant neoplasm of breast: Secondary | ICD-10-CM

## 2021-04-03 ENCOUNTER — Encounter: Payer: Medicare HMO | Admitting: Student in an Organized Health Care Education/Training Program

## 2021-04-22 ENCOUNTER — Other Ambulatory Visit: Payer: Self-pay

## 2021-04-22 ENCOUNTER — Ambulatory Visit
Payer: Medicare HMO | Attending: Student in an Organized Health Care Education/Training Program | Admitting: Student in an Organized Health Care Education/Training Program

## 2021-04-22 ENCOUNTER — Encounter: Payer: Self-pay | Admitting: Student in an Organized Health Care Education/Training Program

## 2021-04-22 VITALS — BP 128/69 | HR 70 | Temp 97.0°F | Resp 15 | Ht 69.0 in | Wt 218.0 lb

## 2021-04-22 DIAGNOSIS — S12110D Anterior displaced Type II dens fracture, subsequent encounter for fracture with routine healing: Secondary | ICD-10-CM | POA: Insufficient documentation

## 2021-04-22 DIAGNOSIS — M542 Cervicalgia: Secondary | ICD-10-CM | POA: Diagnosis not present

## 2021-04-22 DIAGNOSIS — M7918 Myalgia, other site: Secondary | ICD-10-CM

## 2021-04-22 DIAGNOSIS — Y92410 Unspecified street and highway as the place of occurrence of the external cause: Secondary | ICD-10-CM | POA: Diagnosis not present

## 2021-04-22 DIAGNOSIS — G894 Chronic pain syndrome: Secondary | ICD-10-CM

## 2021-04-22 DIAGNOSIS — Z87891 Personal history of nicotine dependence: Secondary | ICD-10-CM | POA: Diagnosis not present

## 2021-04-22 DIAGNOSIS — S12100G Unspecified displaced fracture of second cervical vertebra, subsequent encounter for fracture with delayed healing: Secondary | ICD-10-CM

## 2021-04-22 DIAGNOSIS — Z79891 Long term (current) use of opiate analgesic: Secondary | ICD-10-CM | POA: Insufficient documentation

## 2021-04-22 DIAGNOSIS — M797 Fibromyalgia: Secondary | ICD-10-CM | POA: Diagnosis not present

## 2021-04-22 MED ORDER — OXYCODONE-ACETAMINOPHEN 10-325 MG PO TABS: 1.0000 | ORAL_TABLET | Freq: Three times a day (TID) | ORAL | 0 refills | Status: AC | PRN
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
## 2021-04-22 NOTE — Progress Notes (Signed)
Nursing Pain Medication Assessment:  Safety precautions to be maintained throughout the outpatient stay will include: orient to surroundings, keep bed in low position, maintain call bell within reach at all times, provide assistance with transfer out of bed and ambulation.  Medication Inspection Compliance: Pill count conducted under aseptic conditions, in front of the patient. Neither the pills nor the bottle was removed from the patient's sight at any time. Once count was completed pills were immediately returned to the patient in their original bottle.  Medication: Oxycodone/APAP Pill/Patch Count:  6 of 90 pills remain Pill/Patch Appearance: Markings consistent with prescribed medication Bottle Appearance: Standard pharmacy container. Clearly labeled. Filled Date: 09 / 12 / 2022 Last Medication intake:  Yesterday

## 2021-04-28 LAB — TOXASSURE SELECT 13 (MW), URINE

## 2021-05-16 DIAGNOSIS — I10 Essential (primary) hypertension: Secondary | ICD-10-CM | POA: Diagnosis not present

## 2021-06-12 ENCOUNTER — Ambulatory Visit (INDEPENDENT_AMBULATORY_CARE_PROVIDER_SITE_OTHER): Payer: Medicare HMO | Admitting: Psychiatry

## 2021-06-12 DIAGNOSIS — F901 Attention-deficit hyperactivity disorder, predominantly hyperactive type: Secondary | ICD-10-CM

## 2021-06-12 DIAGNOSIS — F3130 Bipolar disorder, current episode depressed, mild or moderate severity, unspecified: Secondary | ICD-10-CM
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
## 2021-06-13 ENCOUNTER — Encounter: Payer: Self-pay | Admitting: Psychiatry

## 2021-06-13 MED ORDER — AMPHETAMINE-DEXTROAMPHETAMINE 20 MG PO TABS: 20.0000 mg | ORAL_TABLET | Freq: Three times a day (TID) | ORAL | 0 refills | Status: DC

## 2021-06-13 MED ORDER — BUPROPION HCL ER (XL) 300 MG PO TB24: 300.0000 mg | ORAL_TABLET | Freq: Every day | ORAL | 0 refills | Status: DC

## 2021-06-13 MED ORDER — ARIPIPRAZOLE 10 MG PO TABS: 10.0000 mg | ORAL_TABLET | Freq: Every day | ORAL | 1 refills | Status: DC

## 2021-07-17 ENCOUNTER — Encounter: Payer: Medicare HMO | Admitting: Student in an Organized Health Care Education/Training Program

## 2021-07-18 ENCOUNTER — Other Ambulatory Visit: Payer: Self-pay

## 2021-07-18 ENCOUNTER — Encounter: Payer: Self-pay | Admitting: Emergency Medicine

## 2021-07-18 ENCOUNTER — Emergency Department
Admission: EM | Admit: 2021-07-18 | Discharge: 2021-07-18 | Disposition: A | Discharge status: Home / Self Care | Payer: Medicare HMO | Source: Home / Self Care

## 2021-07-18 DIAGNOSIS — R1084 Generalized abdominal pain: Secondary | ICD-10-CM | POA: Diagnosis not present

## 2021-07-18 DIAGNOSIS — Z5321 Procedure and treatment not carried out due to patient leaving prior to being seen by health care provider: Secondary | ICD-10-CM | POA: Insufficient documentation

## 2021-07-18 DIAGNOSIS — F418 Other specified anxiety disorders: Secondary | ICD-10-CM | POA: Diagnosis not present

## 2021-07-18 DIAGNOSIS — Z6834 Body mass index (BMI) 34.0-34.9, adult: Secondary | ICD-10-CM | POA: Diagnosis not present

## 2021-07-18 DIAGNOSIS — Z87891 Personal history of nicotine dependence: Secondary | ICD-10-CM | POA: Diagnosis not present

## 2021-07-18 DIAGNOSIS — R112 Nausea with vomiting, unspecified: Secondary | ICD-10-CM | POA: Diagnosis not present

## 2021-07-18 DIAGNOSIS — K56609 Unspecified intestinal obstruction, unspecified as to partial versus complete obstruction: Secondary | ICD-10-CM | POA: Diagnosis not present

## 2021-07-18 DIAGNOSIS — R739 Hyperglycemia, unspecified: Secondary | ICD-10-CM | POA: Diagnosis not present

## 2021-07-18 DIAGNOSIS — I700 Atherosclerosis of aorta: Secondary | ICD-10-CM | POA: Diagnosis not present

## 2021-07-18 DIAGNOSIS — E890 Postprocedural hypothyroidism: Secondary | ICD-10-CM | POA: Diagnosis not present

## 2021-07-18 DIAGNOSIS — R109 Unspecified abdominal pain: Secondary | ICD-10-CM | POA: Insufficient documentation

## 2021-07-18 DIAGNOSIS — I10 Essential (primary) hypertension: Secondary | ICD-10-CM | POA: Diagnosis not present

## 2021-07-18 DIAGNOSIS — E669 Obesity, unspecified: Secondary | ICD-10-CM | POA: Diagnosis not present

## 2021-07-18 DIAGNOSIS — Z803 Family history of malignant neoplasm of breast: Secondary | ICD-10-CM | POA: Diagnosis not present

## 2021-07-18 DIAGNOSIS — M797 Fibromyalgia: Secondary | ICD-10-CM | POA: Diagnosis not present

## 2021-07-18 DIAGNOSIS — F319 Bipolar disorder, unspecified: Secondary | ICD-10-CM | POA: Diagnosis not present

## 2021-07-18 DIAGNOSIS — Z20822 Contact with and (suspected) exposure to covid-19: Secondary | ICD-10-CM | POA: Diagnosis not present

## 2021-07-18 DIAGNOSIS — F419 Anxiety disorder, unspecified: Secondary | ICD-10-CM | POA: Diagnosis not present

## 2021-07-18 DIAGNOSIS — D72829 Elevated white blood cell count, unspecified: Secondary | ICD-10-CM | POA: Diagnosis not present

## 2021-07-18 DIAGNOSIS — Z4682 Encounter for fitting and adjustment of non-vascular catheter: Secondary | ICD-10-CM | POA: Diagnosis not present

## 2021-07-18 LAB — COMPREHENSIVE METABOLIC PANEL
ALT: 17 U/L (ref 0–44)
AST: 22 U/L (ref 15–41)
Albumin: 4.6 g/dL (ref 3.5–5.0)
Alkaline Phosphatase: 71 U/L (ref 38–126)
Anion gap: 9 (ref 5–15)
BUN: 18 mg/dL (ref 8–23)
CO2: 25 mmol/L (ref 22–32)
Calcium: 10.1 mg/dL (ref 8.9–10.3)
Chloride: 105 mmol/L (ref 98–111)
Creatinine, Ser: 1.05 mg/dL — ABNORMAL HIGH (ref 0.44–1.00)
GFR, Estimated: 59 mL/min — ABNORMAL LOW (ref >60)
Glucose, Bld: 123 mg/dL — ABNORMAL HIGH (ref 70–99)
Potassium: 5.2 mmol/L — ABNORMAL HIGH (ref 3.5–5.1)
Sodium: 139 mmol/L (ref 135–145)
Total Bilirubin: 0.8 mg/dL (ref 0.3–1.2)
Total Protein: 7.5 g/dL (ref 6.5–8.1)

## 2021-07-18 LAB — URINALYSIS, ROUTINE W REFLEX MICROSCOPIC
Bilirubin Urine: NEGATIVE
Glucose, UA: NEGATIVE mg/dL
Hgb urine dipstick: NEGATIVE
Ketones, ur: NEGATIVE mg/dL
Nitrite: NEGATIVE
Protein, ur: NEGATIVE mg/dL
Specific Gravity, Urine: 1.017 (ref 1.005–1.030)
pH: 5 (ref 5.0–8.0)

## 2021-07-18 LAB — CBC
HCT: 48.8 % — ABNORMAL HIGH (ref 36.0–46.0)
Hemoglobin: 16 g/dL — ABNORMAL HIGH (ref 12.0–15.0)
MCH: 29.6 pg (ref 26.0–34.0)
MCHC: 32.8 g/dL (ref 30.0–36.0)
MCV: 90.4 fL (ref 80.0–100.0)
Platelets: 259 10*3/uL (ref 150–400)
RBC: 5.4 MIL/uL — ABNORMAL HIGH (ref 3.87–5.11)
RDW: 12.9 % (ref 11.5–15.5)
WBC: 11 10*3/uL — ABNORMAL HIGH (ref 4.0–10.5)
nRBC: 0 % (ref 0.0–0.2)

## 2021-07-18 LAB — LIPASE, BLOOD: Lipase: 30 U/L (ref 11–51)

## 2021-07-18 NOTE — ED Notes (Signed)
Pt to front desk stating that she is going to go home because she cannot keep waiting. Pt states that she will have her husband bring her back if she gets worse in the middle of the night. Pt ambulatory out of ED.

## 2021-07-18 NOTE — ED Triage Notes (Signed)
CO abdominal pain since yesterday.  States has history of bowel obstruction.  C/O pain to mid abdomen.

## 2021-07-19 ENCOUNTER — Emergency Department: Payer: Medicare HMO

## 2021-07-19 ENCOUNTER — Other Ambulatory Visit: Payer: Self-pay

## 2021-07-19 ENCOUNTER — Inpatient Hospital Stay: Payer: Medicare HMO

## 2021-07-19 ENCOUNTER — Inpatient Hospital Stay
Admission: EM | Admit: 2021-07-19 | Discharge: 2021-07-19 | DRG: 390 | Discharge status: Against Medical Advice | Payer: Medicare HMO | Attending: Internal Medicine | Admitting: Internal Medicine

## 2021-07-19 DIAGNOSIS — Z803 Family history of malignant neoplasm of breast: Secondary | ICD-10-CM

## 2021-07-19 DIAGNOSIS — K56609 Unspecified intestinal obstruction, unspecified as to partial versus complete obstruction: Secondary | ICD-10-CM | POA: Diagnosis not present

## 2021-07-19 DIAGNOSIS — F419 Anxiety disorder, unspecified: Secondary | ICD-10-CM | POA: Diagnosis present

## 2021-07-19 DIAGNOSIS — R112 Nausea with vomiting, unspecified: Secondary | ICD-10-CM | POA: Diagnosis not present

## 2021-07-19 DIAGNOSIS — E669 Obesity, unspecified: Secondary | ICD-10-CM

## 2021-07-19 DIAGNOSIS — D72829 Elevated white blood cell count, unspecified: Secondary | ICD-10-CM

## 2021-07-19 DIAGNOSIS — R739 Hyperglycemia, unspecified: Secondary | ICD-10-CM

## 2021-07-19 DIAGNOSIS — R109 Unspecified abdominal pain: Secondary | ICD-10-CM | POA: Diagnosis not present

## 2021-07-19 DIAGNOSIS — Z87891 Personal history of nicotine dependence: Secondary | ICD-10-CM | POA: Diagnosis not present

## 2021-07-19 DIAGNOSIS — E890 Postprocedural hypothyroidism: Secondary | ICD-10-CM | POA: Diagnosis present

## 2021-07-19 DIAGNOSIS — Z20822 Contact with and (suspected) exposure to covid-19: Secondary | ICD-10-CM | POA: Diagnosis present

## 2021-07-19 DIAGNOSIS — R1084 Generalized abdominal pain: Secondary | ICD-10-CM | POA: Diagnosis not present

## 2021-07-19 DIAGNOSIS — I10 Essential (primary) hypertension: Secondary | ICD-10-CM | POA: Diagnosis present

## 2021-07-19 DIAGNOSIS — I700 Atherosclerosis of aorta: Secondary | ICD-10-CM | POA: Diagnosis not present

## 2021-07-19 DIAGNOSIS — F418 Other specified anxiety disorders: Secondary | ICD-10-CM | POA: Diagnosis present

## 2021-07-19 DIAGNOSIS — Z6834 Body mass index (BMI) 34.0-34.9, adult: Secondary | ICD-10-CM

## 2021-07-19 DIAGNOSIS — M797 Fibromyalgia: Secondary | ICD-10-CM | POA: Diagnosis present

## 2021-07-19 DIAGNOSIS — E66811 Obesity, class 1: Secondary | ICD-10-CM

## 2021-07-19 DIAGNOSIS — F319 Bipolar disorder, unspecified: Secondary | ICD-10-CM | POA: Diagnosis present

## 2021-07-19 DIAGNOSIS — Z4682 Encounter for fitting and adjustment of non-vascular catheter: Secondary | ICD-10-CM | POA: Diagnosis not present

## 2021-07-19 LAB — COMPREHENSIVE METABOLIC PANEL
ALT: 15 U/L (ref 0–44)
AST: 27 U/L (ref 15–41)
Albumin: 4.4 g/dL (ref 3.5–5.0)
Alkaline Phosphatase: 70 U/L (ref 38–126)
Anion gap: 10 (ref 5–15)
BUN: 19 mg/dL (ref 8–23)
CO2: 24 mmol/L (ref 22–32)
Calcium: 9.8 mg/dL (ref 8.9–10.3)
Chloride: 103 mmol/L (ref 98–111)
Creatinine, Ser: 0.93 mg/dL (ref 0.44–1.00)
GFR, Estimated: >60 mL/min (ref >60)
Glucose, Bld: 172 mg/dL — ABNORMAL HIGH (ref 70–99)
Potassium: 4.4 mmol/L (ref 3.5–5.1)
Sodium: 137 mmol/L (ref 135–145)
Total Bilirubin: 1 mg/dL (ref 0.3–1.2)
Total Protein: 7.4 g/dL (ref 6.5–8.1)

## 2021-07-19 LAB — CBC
HCT: 48.3 % — ABNORMAL HIGH (ref 36.0–46.0)
Hemoglobin: 16.2 g/dL — ABNORMAL HIGH (ref 12.0–15.0)
MCH: 29.9 pg (ref 26.0–34.0)
MCHC: 33.5 g/dL (ref 30.0–36.0)
MCV: 89.1 fL (ref 80.0–100.0)
Platelets: 285 10*3/uL (ref 150–400)
RBC: 5.42 MIL/uL — ABNORMAL HIGH (ref 3.87–5.11)
RDW: 12.9 % (ref 11.5–15.5)
WBC: 13.6 10*3/uL — ABNORMAL HIGH (ref 4.0–10.5)
nRBC: 0 % (ref 0.0–0.2)

## 2021-07-19 LAB — RESP PANEL BY RT-PCR (FLU A&B, COVID) ARPGX2
Influenza A by PCR: NEGATIVE
Influenza B by PCR: NEGATIVE
SARS Coronavirus 2 by RT PCR: NEGATIVE

## 2021-07-19 LAB — PROCALCITONIN: Procalcitonin: 0.29 ng/mL

## 2021-07-19 LAB — LIPASE, BLOOD: Lipase: 29 U/L (ref 11–51)

## 2021-07-19 IMAGING — CT CT ABD-PELV W/ CM
2 of 5 series · 15 of 46 positions shown, 17 images · IV contrast (omnipaque)
Comparison: CT Abdomen and Pelvis [DATE].

CLINICAL DATA: 65-year-old female with lower abdominal pain.
History of prior small bowel obstruction.

EXAM:
CT ABDOMEN AND PELVIS WITH CONTRAST
TECHNIQUE: Multidetector CT imaging of the abdomen and pelvis was performed
using the standard protocol following bolus administration of
intravenous contrast.
CONTRAST:  100mL OMNIPAQUE IOHEXOL 300 MG/ML  SOLN

[Series 2: routine abd/pel with · axial · 0.98mm/px · z∈[-857,-392]mm · 12 of 105 slices shown, 14 images]
[im 6/105  soft-tissue]
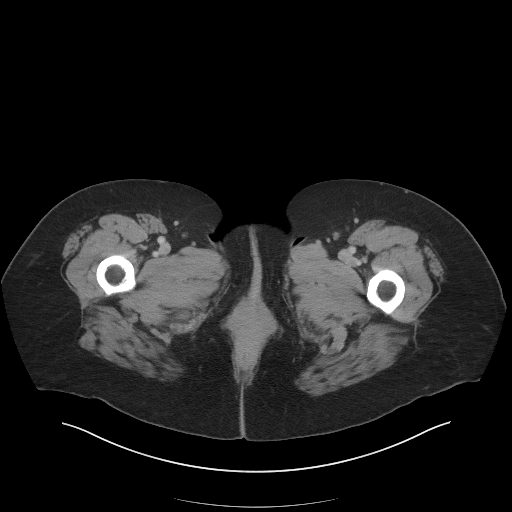
[im 6/105  bone]
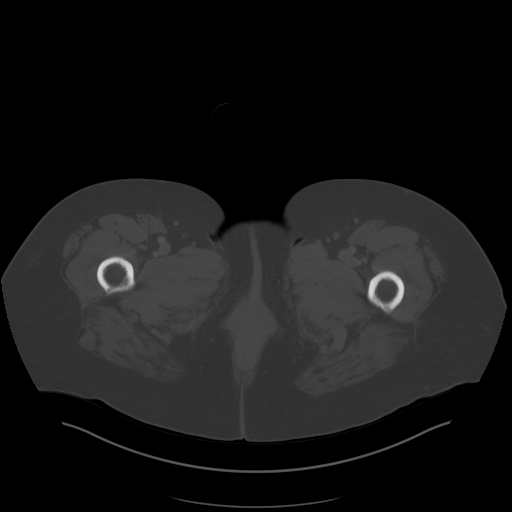
[im 17/105  soft-tissue]
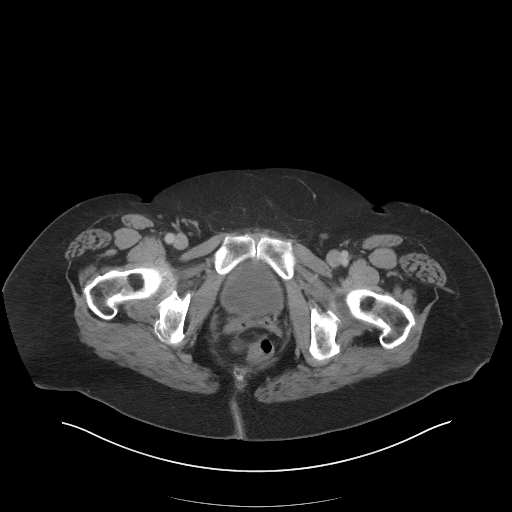
[im 22/105  soft-tissue]
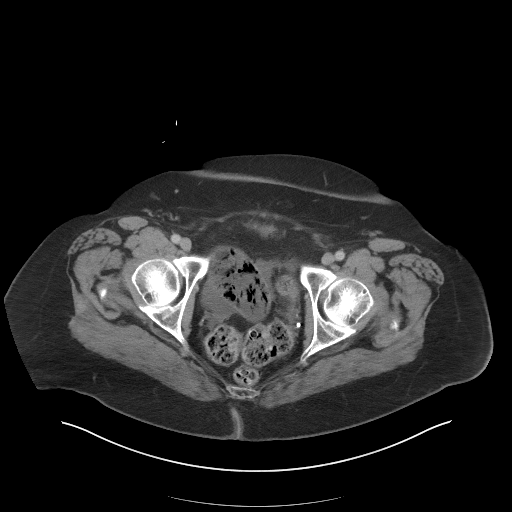
[im 33/105  soft-tissue]
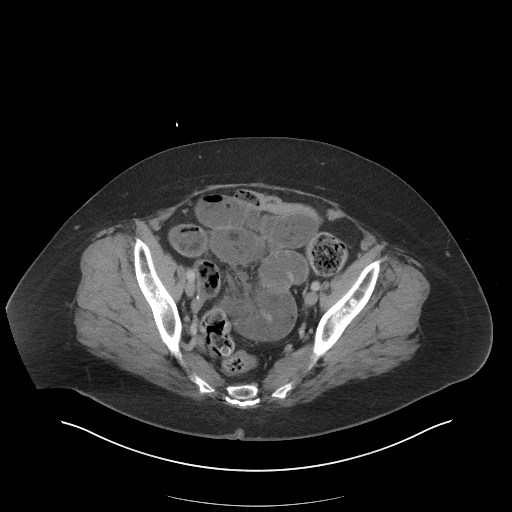
[im 39/105  soft-tissue]
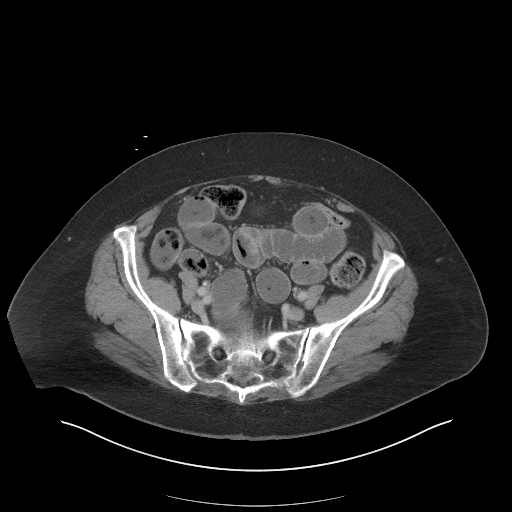
[im 50/105  soft-tissue]
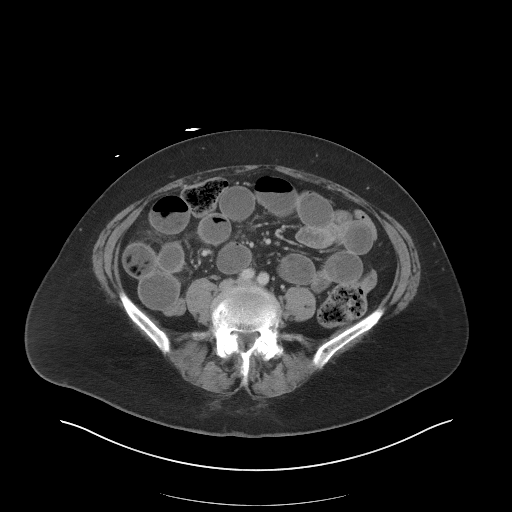
[im 55/105  soft-tissue]
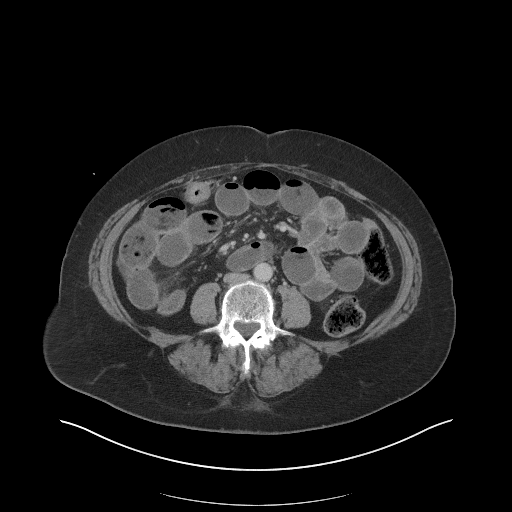
[im 66/105  soft-tissue]
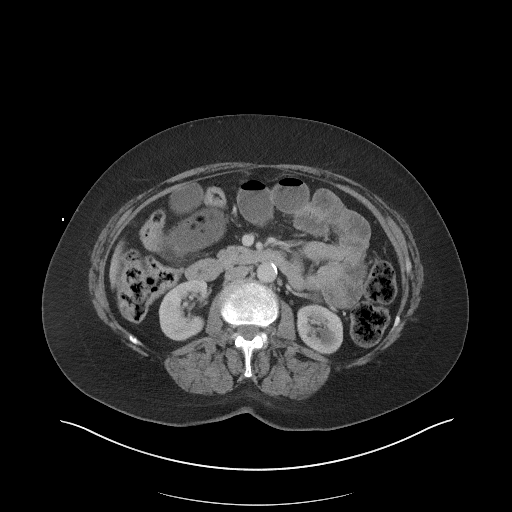
[im 72/105  soft-tissue]
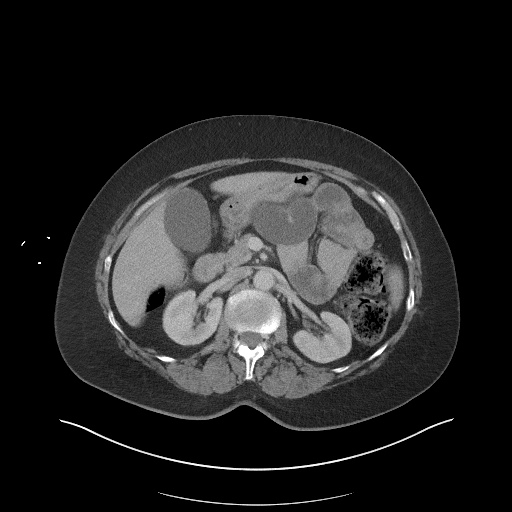
[im 72/105  bone]
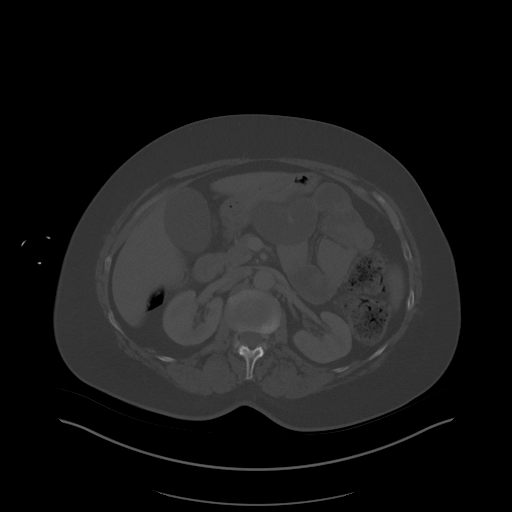
[im 83/105  soft-tissue]
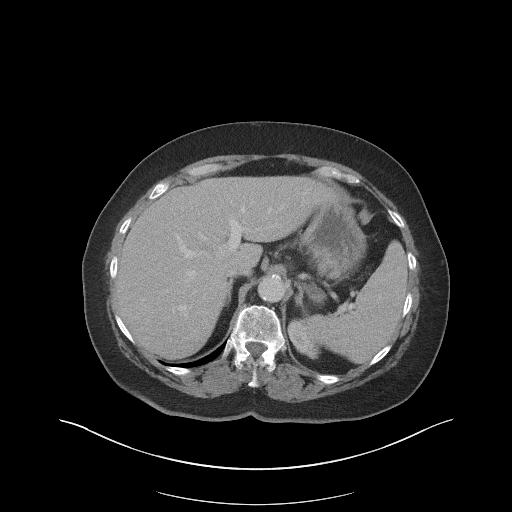
[im 88/105  soft-tissue]
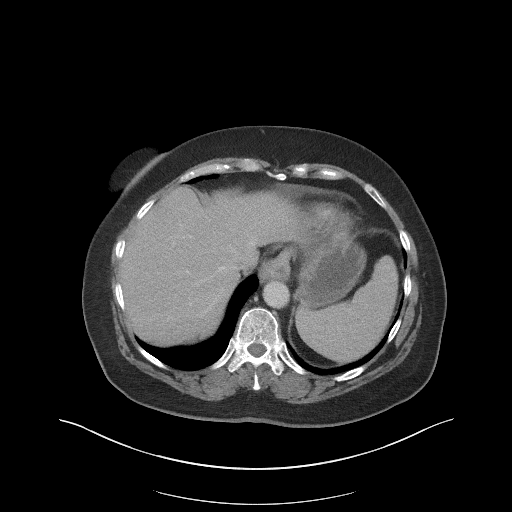
[im 99/105  soft-tissue]
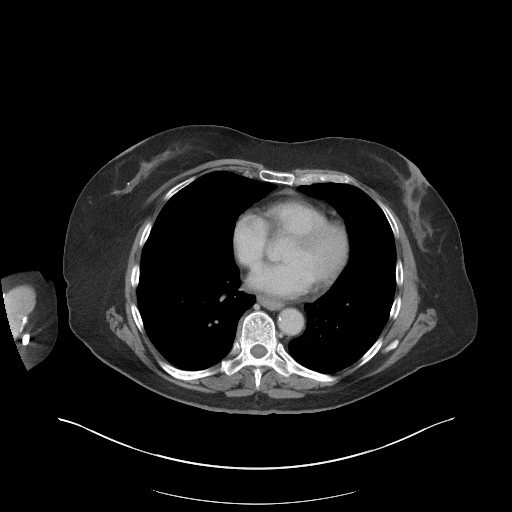

[Series 5: coronal st · coronal · 0.90mm/px · 3 of 108 slices shown]
[im 36/108  soft-tissue]
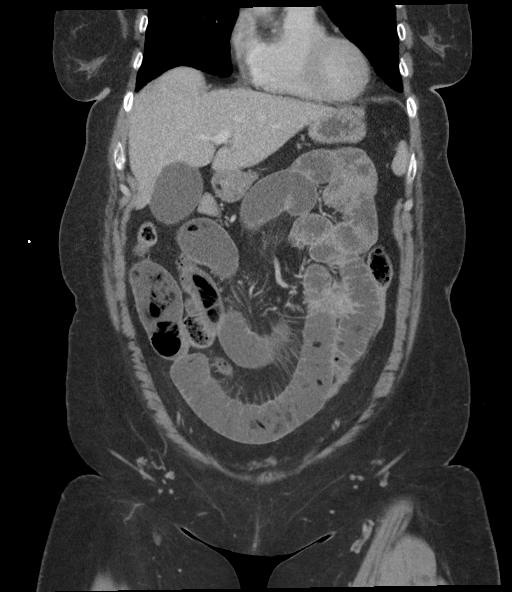
[im 48/108  soft-tissue]
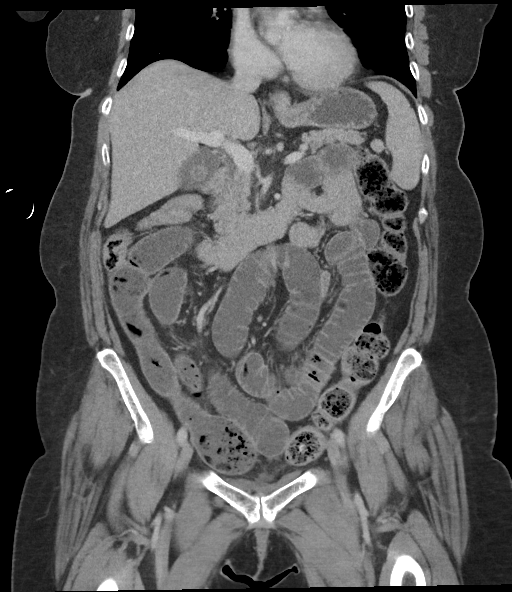
[im 60/108  soft-tissue]
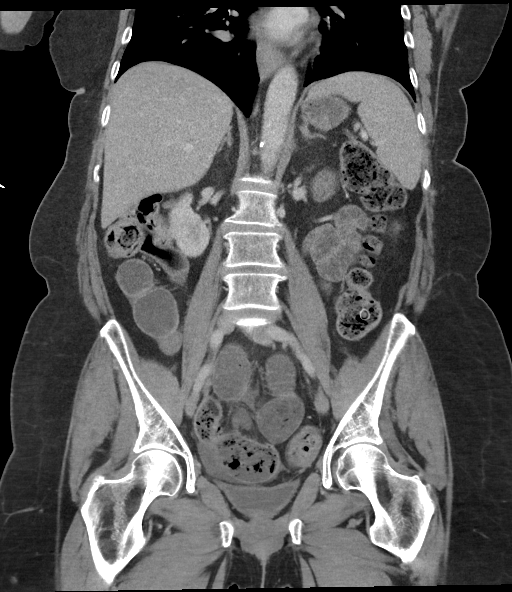

[15 of 46 positions shown; findings below may reference images not displayed]

FINDINGS: Lower chest: Negative.

Hepatobiliary: Gallstone is 16 mm on series 2, image 31. Gallbladder
otherwise within normal limits. Negative liver. No bile duct
enlargement.

Pancreas: Negative.

Spleen: Negative. Normal splenule.

Adrenals/Urinary Tract: Normal adrenal glands. Nonobstructed
kidneys. Benign appearing left renal lower pole cyst. Symmetric
renal enhancement and contrast excretion. Decompressed ureters.
Completely decompressed bladder, although there is a single dot of
gas within the decompressed bladder dome on sagittal image 75.

Stomach/Bowel: Redundant large bowel in the pelvis with retained
stool increased from last year. Moderate retained stool in the large
bowel elsewhere is similar. Redundant transverse colon. Appendix is
normal in the right upper quadrant on series 2, image 35, cecum is
on a lax mesentery.

Terminal ileum is gas-filled, but just upstream of the TI the small
bowel appears indistinct and mildly inflamed on series 2, image 51.
Fairly decompressed loop immediately upstream of that with
flocculated contents (small bowel stool sign, and then gradual
transition (series 2, image 79) to dilated small bowel loops which
are mostly fluid-filled throughout the abdomen and pelvis. Similar
appearance to [REDACTED] CT.

Small gastric hiatal hernia. Stomach and duodenum decompressed.
Ligament of Treitz decompressed. No free air. Small volume of
scattered small bowel mesentery free fluid in the lower abdomen.
Some mesenteric inflammation, but no focal or confluent inflamed
small bowel.

Vascular/Lymphatic: Calcified aortic atherosclerosis. Major arterial
structures are patent. Portal venous system is patent. No
lymphadenopathy identified.

Reproductive: Surgically absent uterus. Diminutive or absent
ovaries.

Other: Small volume free fluid in the pelvis with simple fluid
density, similar to the prior exam.

Musculoskeletal: Mild lumbar spondylolisthesis with facet
degeneration. L1 superior endplate deformity is new [REDACTED], but
most resembles a Schmorl's node (series 6, image 68). No other No
acute osseous abnormality identified.
IMPRESSION: 1. Recurrent small bowel obstruction, although with no high-grade
transition point, and abnormal bowel extending to the terminal ileum
which appears mildly inflamed.
Abundant small bowel feces sign upstream of the TI including in some
of the dilated loops.
Perhaps then this reflects bowel obstruction secondary to
Inflammatory Bowel Disease (such as Crohn disease). There is a small
volume of free fluid in the small bowel mesentery in the pelvis, but
no free air.

2. Redundant large bowel with increased retained stool compared to
the CT last year.

3. Trace gas with[REDACTED]ompressed urinary bladder, cannot exclude
gas-forming UTI.

4. Cholelithiasis.

5. L1 superior endplate deformity is new since last year, but most
resembles a Schmorl's node.

6. Aortic Atherosclerosis ([GV]-[GV]).

## 2021-07-19 MED ORDER — SODIUM CHLORIDE 0.9 % IV SOLN: INTRAVENOUS | Status: DC

## 2021-07-19 MED ORDER — DROPERIDOL 2.5 MG/ML IJ SOLN
2.5000 mg | Freq: Once | INTRAMUSCULAR | Status: AC
  Administered 2021-07-19: 2.5 mg via INTRAVENOUS
  Filled 2021-07-19: qty 2

## 2021-07-19 MED ORDER — LIDOCAINE VISCOUS HCL 2 % MT SOLN
15.0000 mL | Freq: Once | OROMUCOSAL | Status: AC
  Administered 2021-07-19: 15 mL via OROMUCOSAL
  Filled 2021-07-19: qty 15

## 2021-07-19 MED ORDER — ONDANSETRON HCL 4 MG/2ML IJ SOLN
4.0000 mg | Freq: Once | INTRAMUSCULAR | Status: AC | PRN
  Administered 2021-07-19: 4 mg via INTRAVENOUS
  Filled 2021-07-19: qty 2

## 2021-07-19 MED ORDER — IOHEXOL 300 MG/ML  SOLN
100.0000 mL | Freq: Once | INTRAMUSCULAR | Status: AC | PRN
  Administered 2021-07-19: 100 mL via INTRAVENOUS

## 2021-07-19 MED ORDER — FENTANYL CITRATE PF 50 MCG/ML IJ SOSY
50.0000 ug | PREFILLED_SYRINGE | INTRAMUSCULAR | Status: DC | PRN
  Administered 2021-07-19: 50 ug via INTRAVENOUS
  Filled 2021-07-19: qty 1

## 2021-07-19 MED ORDER — ONDANSETRON HCL 4 MG/2ML IJ SOLN
4.0000 mg | INTRAMUSCULAR | Status: AC
  Administered 2021-07-19: 4 mg via INTRAVENOUS
  Filled 2021-07-19: qty 2

## 2021-07-19 MED ORDER — MORPHINE SULFATE (PF) 4 MG/ML IV SOLN: 4.0000 mg | INTRAVENOUS | Status: DC | PRN

## 2021-07-19 NOTE — ED Triage Notes (Signed)
Pt states lower abd pain, sulfur taste in mouth, vomiting. Pt states history of SBO x2 in past with same symptoms. Pt with nausea

## 2021-07-19 NOTE — ED Provider Notes (Signed)
[facility] Provider Note    Event Date/Time   First MD Initiated Contact with Patient 07/19/21 [phone]     (approximate)   History   Abdominal Pain   HPI  Gina Wallace is a 66 y.o. female with past medical history that includes, but is not limited to, 2 prior episodes of small bowel obstruction with no clear cause, a prior hysterectomy but no other abdominal surgeries, and various pain syndromes and prior electrolyte abnormalities.  She presents tonight for acute onset abdominal distention and pain.  She said that she believes the symptoms actually started the day before yesterday because she was not feeling well in general.  However over the last 24 hours she developed to the upper and then generalized abdominal pain that she associates with her prior small bowel obstructions.  It is a generalized aching pain associated with numerous episodes of nausea and vomiting.  She had normal bowel movements prior to the onset of the pain but now is not passing bowel movements or gas.  Moving around makes the pain worse and nothing in particular makes it better.  She denies fever, chest pain, and shortness of breath.  She has never been diagnosed with inflammatory bowel disease and has no members of her family with IBD.     Physical Exam   Triage Vital Signs: ED Triage Vitals  Enc Vitals Group     BP 07/19/21 0344 140/81     Pulse Rate 07/19/21 0344 100     Resp 07/19/21 0344 18     Temp 07/19/21 0344 99 F (37.2 C)     Temp Source 07/19/21 0344 Oral     SpO2 07/19/21 0344 98 %     Weight 07/19/21 0345 105.7 kg (233 lb)     Height 07/19/21 0345 1.753 m (5\' 9" )     Head Circumference --      Peak Flow --      Pain Score 07/19/21 0345 10     Pain Loc --      Pain Edu? --      Excl. in [place]? --     Most recent vital signs: Vitals:   07/19/21 0344 07/19/21 0635  BP: 140/81 (!) 162/79  Pulse: 100 100  Resp: 18 18  Temp: 99 F (37.2 C)   SpO2: 98%  100%     General: Awake and alert, appears very uncomfortable. CV:  Good peripheral perfusion.  Normal heart rate. Resp:  Normal effort.  No accessory muscle usage. Abd:  Mild abdominal distention.  Tenderness to palpation throughout the abdomen with no specific localized peritonitis but the tenderness is moderate to severe. Other:  No focal neurological deficits   ED Results / Procedures / Treatments   Labs (all labs ordered are listed, but only abnormal results are displayed) Labs Reviewed  COMPREHENSIVE METABOLIC PANEL - Abnormal; Notable for the following components:      Result Value   Glucose, Bld 172 (*)    All other components within normal limits  CBC - Abnormal; Notable for the following components:   WBC 13.6 (*)    RBC 5.42 (*)    Hemoglobin 16.2 (*)    HCT 48.3 (*)    All other components within normal limits  RESP PANEL BY RT-PCR (FLU A&B, COVID) ARPGX2  LIPASE, BLOOD  URINALYSIS, ROUTINE W REFLEX MICROSCOPIC    RADIOLOGY I personally reviewed the patient's CT scan and can visualize areas of small bowel obstruction.  This was supported by the radiologist report which  confirms recurrent small bowel obstruction with no obvious high-grade transition point but with abnormal bowel extending to the terminal ileum which appears inflamed.  There is a question of possible inflammatory bowel disease given some of the findings.     PROCEDURES:  Critical Care performed: No  Procedures   MEDICATIONS ORDERED IN ED: Medications  fentaNYL (SUBLIMAZE) injection 50 mcg (50 mcg Intravenous Given 07/19/21 0355)  morphine 4 MG/ML injection 4 mg (has no administration in time range)  ondansetron (ZOFRAN) injection 4 mg (4 mg Intravenous Given 07/19/21 0354)  iohexol (OMNIPAQUE) 300 MG/ML solution 100 mL (100 mLs Intravenous Contrast Given 07/19/21 0406)  ondansetron (ZOFRAN) injection 4 mg (4 mg Intravenous Given 07/19/21 0616)  lidocaine (XYLOCAINE) 2 % viscous mouth solution 15  mL (15 mLs Mouth/Throat Given 07/19/21 0616)  droperidol (INAPSINE) 2.5 MG/ML injection 2.5 mg (2.5 mg Intravenous Given 07/19/21 0634)     IMPRESSION / MDM / ASSESSMENT AND PLAN / ED COURSE  I reviewed the triage vital signs and the nursing notes.                              Differential diagnosis includes, but is not limited to, SBO, volvulus, IBD, enteritis/colitis.  Labs/studies ordered include comprehensive metabolic panel, CBC, lipase, and CT of the abdomen pelvis with IV contrast.  I personally reviewed the lab results and her CMP is unremarkable, lipase is normal, and CBC is notable for hemoconcentration with a hemoglobin of 16.2 and a mild leukocytosis of 13.6.  I believe the leukocytosis is reactive to her acute condition and her hemoconcentration, not reflective of acute infection.  As documented above I also personally reviewed the CT scan results and agree with the radiologist's interpretation that she has recurrent SBO.  This is very consistent with her symptoms.  She has nontoxic in appearance but very uncomfortable.  She received fentanyl and Zofran in triage but her symptoms are starting to return.  I ordered another dose of Zofran 4 mg IV and morphine 4 mg IV with another dose to be administered as needed for moderate to severe pain.  I updated the patient and husband regarding her findings.  I reviewed the discharge summary written by Dr. Leslye Peer with the hospitalist service when the patient was discharged on 12/09/2020, the last time she was admitted for SBO, and verify that she did not require surgery at that time.  Patient said that she felt better previously with an NG tube, and given that she is having persistent nausea and relatively recent vomiting, I ordered the placement of another NG tube to low intermittent suction this morning.  I will contact the hospitalist service for admission.       Clinical Course as of 07/19/21 0653  Sat Jul 19, 2021  0629 NG tube  placed, but patient still vomiting.  Patient has already received 2 doses of Zofran 4 mg IV.  Given her persistent symptoms, I ordered droperidol 2.5 mg IV for the nausea and vomiting relief but also as an adjunct to her pain medication.  I reviewed an old EKG to verify she has no history of prolonged QTC. [CF]  8588 Consulted the hospitalist service (Dr. Damita Dunnings) for admission.  She agrees with the plan and is putting in admission orders. [CF]    Clinical Course User Index [CF] Hinda Kehr, MD     FINAL CLINICAL IMPRESSION(S) / ED  DIAGNOSES   Final diagnoses:  Small bowel obstruction (HCC)  Intractable nausea and vomiting     Rx / DC Orders   ED Discharge Orders     None        Note:  This document was prepared using Dragon voice recognition software and may include unintentional dictation errors.   Hinda Kehr, MD 07/19/21 [phone]

## 2021-07-19 NOTE — H&P (Signed)
History and Physical  Gina Wallace FGH:[id number] DOB: 07-08-1956 DOA: 07/19/2021  Referring physician: Hinda Kehr, MD PCP: Kirk Ruths, MD  Patient coming from: Home  Chief Complaint: Abdominal pain  HPI: Gina Wallace is a 66 y.o. female with medical history significant for recurrent small bowel obstruction, hypertension, bipolar disorder, anxiety disorder, depression, fibromyalgia, prior hysterectomy who presents to the emergency department due to 1 day onset of abdominal pain.  Patient complained of abdominal pain and distention, abdominal pain was initially within the upper quadrants, but this has since progressed to generalized abdominal pain which was achy in nature and was associated with most episodes of nausea and nonbloody vomiting.  There was no known alleviating factor, but moving around aggravates the pain.  Last bowel movement was about 4 days ago.  She denies fever, chills, chest pain, shortness of breath, headache, personal history or family history of inflammatory bowel disease. Patient was admitted from 5/26 to 12/09/2020 due to small bowel obstruction during which patient was managed conservatively without surgery.   ED Course:  In the emergency department, she was hemodynamically stable.  Work-up in the ED showed leukocytosis, elevated H/H at 16.2/40.3.  Normal BMP except for hyperglycemia, lipase 30 >29, urinalysis was unimpressive for UTI.  Influenza A, B, SARS coronavirus 2 was negative. Abdominal x-ray showed Enteric tube placed into the stomach, side hole the level of the gastric body. CT abdomen and pelvis with contrast showed recurrent small bowel obstruction, although with no high-grade transition point, and abnormal bowel extending to the terminal ileum which appears mildly inflamed. Patient was treated with Zofran, droperidol, fentanyl, morphine and NGT was placed.  Hospitalist was asked to admit patient for further evaluation and  management.   Review of Systems: A full 10 point Review of Systems was Wallace, except as stated above, all other Review of systems were negative.   Past Medical History:  Diagnosis Date   ADHD (attention deficit hyperactivity disorder)    Anxiety    Bipolar disorder (HCC)    Carpal tunnel syndrome    Complication of anesthesia    pt requires larger doses of meds for effect   Depression    Fibromyalgia    Hypertension    Macular degeneration    Vitamin D deficiency    Wears dentures    full upper and lower   Past Surgical History:  Procedure Laterality Date   ABDOMINAL HYSTERECTOMY     ABLATION     ANTERIOR CRUCIATE LIGAMENT REPAIR Right    BREAST BIOPSY Bilateral    All negative   CESAREAN SECTION     EYE SURGERY     HALLUX VALGUS LAPIDUS Left 02/02/2018   Procedure: HALLUX VALGUS LAPIDUS;  Surgeon: Samara Deist, DPM;  Location: [place];  Service: Podiatry;  Laterality: Left;  general w popliteal block lapiplasty set   HAMMER TOE SURGERY Left 02/02/2018   Procedure: HAMMER TOE CORRECTION - T1 & T2;  Surgeon: Samara Deist, DPM;  Location: [place];  Service: Podiatry;  Laterality: Left;   THYROIDECTOMY Right    left lobe remains   TONSILLECTOMY AND ADENOIDECTOMY     WEIL OSTEOTOMY Left 02/02/2018   Procedure: WEIL OSTEOTOMY X 2, REVERSE [place], AND Barbie Banner;  Surgeon: Samara Deist, DPM;  Location: [place];  Service: Podiatry;  Laterality: Left;    Social History:  reports that she quit smoking about 13 years ago. Her smoking use included cigarettes. She has never used smokeless tobacco. She reports  that she does not currently use alcohol. She reports that she does not use drugs.   No Known Allergies  Family History  Problem Relation Age of Onset   Anxiety disorder Mother    Depression Mother    Breast cancer Mother 43   Alcohol abuse Maternal Uncle    Breast cancer Maternal Aunt    Breast cancer Maternal Grandmother      Prior  to Admission medications   Medication Sig Start Date End Date Taking? Authorizing Provider  amphetamine-dextroamphetamine (ADDERALL) 20 MG tablet Take 1 tablet (20 mg total) by mouth 3 (three) times daily. 08/28/21   Thayer Headings, PMHNP  amphetamine-dextroamphetamine (ADDERALL) 20 MG tablet Take 1 tablet (20 mg total) by mouth in the morning, at noon, and at bedtime. 07/31/21 08/30/21  Thayer Headings, PMHNP  amphetamine-dextroamphetamine (ADDERALL) 20 MG tablet Take 1 tablet (20 mg total) by mouth 3 (three) times daily. 06/23/21 07/23/21  Thayer Headings, PMHNP  ARIPiprazole (ABILIFY) 10 MG tablet Take 1 tablet (10 mg total) by mouth daily. 06/13/21 09/11/21  Thayer Headings, PMHNP  buPROPion (WELLBUTRIN XL) 300 MG 24 hr tablet Take 1 tablet (300 mg total) by mouth daily. 06/13/21   Thayer Headings, PMHNP  Cholecalciferol (VITAMIN D) 50 MCG (2000 UT) tablet Take 2,000 Units by mouth daily.    [provider]  oxyCODONE-acetaminophen (PERCOCET) 10-325 MG tablet Take 1 tablet by mouth every 8 (eight) hours as needed for pain. Must last 30 days. 06/21/21 07/21/21  Gillis Santa, MD  oxyCODONE-acetaminophen (PERCOCET) 10-325 MG tablet Take 1 tablet by mouth every 4 (four) hours as needed for pain.    [provider]  polyethylene glycol (MIRALAX / GLYCOLAX) 17 g packet Take 17 g by mouth daily as needed for moderate constipation.    [provider]  Probiotic Product (PROBIOTIC PO) Take 2 tablets by mouth daily.    [provider]  tiZANidine (ZANAFLEX) 4 MG tablet Take 4 mg by mouth at bedtime as needed for muscle spasms. 10/05/20   [provider]  traZODone (DESYREL) 150 MG tablet Take 3 tablets (450 mg total) by mouth at bedtime. 01/01/21 04/22/21  Thayer Headings, PMHNP    Physical Exam: BP (!) 162/79    Pulse 100    Temp 99 F (37.2 C) (Oral)    Resp 18    Ht 5\' 9"  (1.753 m)    Wt 105.7 kg    SpO2 100%    BMI 34.41 kg/m   General: 66 y.o. year-old female  well developed well nourished in no acute distress.  Alert and oriented x3. HEENT: NCAT, EOMI Neck: Supple, trachea medial Cardiovascular: Regular rate and rhythm with no rubs or gallops.  No thyromegaly or JVD noted.  No lower extremity edema. 2/4 pulses in all 4 extremities. Respiratory: Clear to auscultation with no wheezes or rales. Good inspiratory effort. Abdomen: Soft, nontender nondistended with normal bowel sounds x4 quadrants. Muskuloskeletal: No cyanosis, clubbing or edema noted bilaterally Neuro: CN II-XII intact, strength 5/5 x 4, sensation, reflexes intact Skin: No ulcerative lesions noted or rashes Psychiatry: Judgement and insight appear normal. Mood is appropriate for condition and setting          Labs on Admission:  Basic Metabolic Panel: Recent Labs  Lab 07/18/21 1842 07/19/21 0350  NA 139 137  K 5.2* 4.4  CL 105 103  CO2 25 24  GLUCOSE 123* 172*  BUN 18 19  CREATININE 1.05* 0.93  CALCIUM 10.1 9.8   Liver Function  Tests: Recent Labs  Lab 07/18/21 1842 07/19/21 0350  AST 22 27  ALT 17 15  ALKPHOS 71 70  BILITOT 0.8 1.0  PROT 7.5 7.4  ALBUMIN 4.6 4.4   Recent Labs  Lab 07/18/21 1842 07/19/21 0350  LIPASE 30 29   No results for input(s): AMMONIA in the last 168 hours. CBC: Recent Labs  Lab 07/18/21 1842 07/19/21 0350  WBC 11.0* 13.6*  HGB 16.0* 16.2*  HCT 48.8* 48.3*  MCV 90.4 89.1  PLT 259 285   Cardiac Enzymes: No results for input(s): CKTOTAL, CKMB, CKMBINDEX, TROPONINI in the last 168 hours.  BNP (last 3 results) No results for input(s): BNP in the last 8760 hours.  ProBNP (last 3 results) No results for input(s): PROBNP in the last 8760 hours.  CBG: No results for input(s): GLUCAP in the last 168 hours.  Radiological Exams on Admission: DG Abdomen 1 View  Result Date: 07/19/2021 CLINICAL DATA:  66 year old female NG tube placement. EXAM: ABDOMEN - 1 VIEW COMPARISON:  CT Abdomen and Pelvis 0412 hours today. FINDINGS: Supine  AP views at 0648 hours. Enteric tube placed into the stomach, side hole the level of the gastric body. Excreted IV contrast in renal collecting systems and urinary bladder. Stable bowel gas pattern from the CT scout view earlier today. Negative lung bases. No acute osseous abnormality identified. IMPRESSION: Enteric tube placed into the stomach, side hole the level of the gastric body. Electronically Signed   By: Genevie Ann M.D.   On: 07/19/2021 07:12   CT ABDOMEN PELVIS W CONTRAST  Result Date: 07/19/2021 CLINICAL DATA:  66 year old female with lower abdominal pain. History of prior small bowel obstruction. EXAM: CT ABDOMEN AND PELVIS WITH CONTRAST TECHNIQUE: Multidetector CT imaging of the abdomen and pelvis was performed using the standard protocol following bolus administration of intravenous contrast. CONTRAST:  174mL OMNIPAQUE IOHEXOL 300 MG/ML  SOLN COMPARISON:  CT Abdomen and Pelvis 12/05/2020. FINDINGS: Lower chest: Negative. Hepatobiliary: Gallstone is 16 mm on series 2, image 31. Gallbladder otherwise within normal limits. Negative liver. No bile duct enlargement. Pancreas: Negative. Spleen: Negative. Normal splenule. Adrenals/Urinary Tract: Normal adrenal glands. Nonobstructed kidneys. Benign appearing left renal lower pole cyst. Symmetric renal enhancement and contrast excretion. Decompressed ureters. Completely decompressed bladder, although there is a single dot of gas within the decompressed bladder dome on sagittal image 75. Stomach/Bowel: Redundant large bowel in the pelvis with retained stool increased from last year. Moderate retained stool in the large bowel elsewhere is similar. Redundant transverse colon. Appendix is normal in the right upper quadrant on series 2, image 35, cecum is on a lax mesentery. Terminal ileum is gas-filled, but just upstream of the TI the small bowel appears indistinct and mildly inflamed on series 2, image 51. Fairly decompressed loop immediately upstream of that with  flocculated contents (small bowel stool sign, and then gradual transition (series 2, image 79) to dilated small bowel loops which are mostly fluid-filled throughout the abdomen and pelvis. Similar appearance to the May CT. Small gastric hiatal hernia. Stomach and duodenum decompressed. Ligament of Treitz decompressed. No free air. Small volume of scattered small bowel mesentery free fluid in the lower abdomen. Some mesenteric inflammation, but no focal or confluent inflamed small bowel. Vascular/Lymphatic: Calcified aortic atherosclerosis. Major arterial structures are patent. Portal venous system is patent. No lymphadenopathy identified. Reproductive: Surgically absent uterus. Diminutive or absent ovaries. Other: Small volume free fluid in the pelvis with simple fluid density, similar to the prior exam. Musculoskeletal: Mild  lumbar spondylolisthesis with facet degeneration. L1 superior endplate deformity is new since May, but most resembles a Schmorl's node (series 6, image 68). No other No acute osseous abnormality identified. IMPRESSION: 1. Recurrent small bowel obstruction, although with no high-grade transition point, and abnormal bowel extending to the terminal ileum which appears mildly inflamed. Abundant small bowel feces sign upstream of the TI including in some of the dilated loops. Perhaps then this reflects bowel obstruction secondary to Inflammatory Bowel Disease (such as Crohn disease). There is a small volume of free fluid in the small bowel mesentery in the pelvis, but no free air. 2. Redundant large bowel with increased retained stool compared to the CT last year. 3. Trace gas within decompressed urinary bladder, cannot exclude gas-forming UTI. 4. Cholelithiasis. 5. L1 superior endplate deformity is new since last year, but most resembles a Schmorl's node. 6. Aortic Atherosclerosis (ICD10-I70.0). Electronically Signed   By: Genevie Ann M.D.   On: 07/19/2021 04:36    EKG: I independently viewed the EKG  Wallace and my findings are as followed: EKG was not Wallace in the ED  Assessment/Plan Present on Admission:  SBO (small bowel obstruction) ([facility])  Depression with anxiety  Fibromyalgia  Principal Problem:   SBO (small bowel obstruction) (HCC) Active Problems:   Fibromyalgia   Depression with anxiety   Nausea & vomiting   Abdominal pain   Leukocytosis   Hyperglycemia   Obesity (BMI 30.0-34.9)   Abdominal pain, nausea and vomiting secondary to recurrent small bowel obstruction Continue NG tube  Continue NPO at this time with plan to advanced diet as tolerated Continue IV hydration Continue IV morphine for moderate/severe pain Continue zofran p.r.n. for nausea/vomiting General surgery will be consulted and we shall await further recommendation  Leukocytosis possible secondary to reactive process WBC 13.6, continue management as described above Procalcitonin will be checked to ensure no acute infectious process  Hyperglycemia possibly secondary to reactive process CBG 172, patient has no history of T2DM, hemoglobin A1c will be checked  Fibromyalgia, depression with anxiety Patient is currently n.p.o., hold all meds at this time  Obesity (34.41 kg/m) Patient was counseled on diet and lifestyle modification   DVT prophylaxis: SCDs  Code Status: Full code  Family Communication: None at bedside  Disposition Plan:  Patient is from:                        home Anticipated DC to:                   SNF or family members home Anticipated DC date:               2-3 days Anticipated DC barriers:          Patient requires inpatient management due to small bowel obstruction associated with nausea and vomiting and requiring NG tube placement   Consults called: General surgery  Admission status: Inpatient    Bernadette Hoit MD [facility]  07/19/2021, 7:45 AM

## 2021-07-19 NOTE — ED Notes (Signed)
Patient yelling out d/t just wanting to move in bed. Try to go in and reposition her. Call light within reach and explained for her to use it.

## 2021-07-19 NOTE — ED Notes (Signed)
MD came and saw patient will sign out against medical advice. Pulled pulled out NG tubed.

## 2021-07-19 NOTE — ED Notes (Addendum)
Helped to BR. Changed into a hospital; gown. Face washed. Position in bed for comfort. Belongings placed in patient bag. NG intact drainage light brown liquid.

## 2021-07-19 NOTE — ED Notes (Signed)
Patient yelling out. Pulled off leads off and took arm out of gown. States I am leaving. Doctor texted.

## 2021-07-19 NOTE — ED Notes (Signed)
Yelling out d/t IV pump beeping. Stating I am not staying. I want to leave. Provided comfort measures and repositioned in bed and tapped IV.

## 2021-07-19 NOTE — Consult Note (Signed)
Subjective:   CC: Small bowel obstruction  HPI:  Gina Wallace is a 66 y.o. female who was consulted by [facility] for issue above.   Patient has history of obstructions in the past.  This episode started 1 day ago, with pain associated nausea and emesis.  No alleviating factors, no worsening factors.   Past Medical History:  has a past medical history of ADHD (attention deficit hyperactivity disorder), Anxiety, Bipolar disorder ([place]), Carpal tunnel syndrome, Complication of anesthesia, Depression, Fibromyalgia, Hypertension, Macular degeneration, Vitamin D deficiency, and Wears dentures.  Past Surgical History:  Past Surgical History:  Procedure Laterality Date   ABDOMINAL HYSTERECTOMY     ABLATION     ANTERIOR CRUCIATE LIGAMENT REPAIR Right    BREAST BIOPSY Bilateral    All negative   CESAREAN SECTION     EYE SURGERY     HALLUX VALGUS LAPIDUS Left 02/02/2018   Procedure: HALLUX VALGUS LAPIDUS;  Surgeon: Samara Deist, DPM;  Location: [place];  Service: Podiatry;  Laterality: Left;  general w popliteal block lapiplasty set   HAMMER TOE SURGERY Left 02/02/2018   Procedure: HAMMER TOE CORRECTION - T1 & T2;  Surgeon: Samara Deist, DPM;  Location: [place];  Service: Podiatry;  Laterality: Left;   THYROIDECTOMY Right    left lobe remains   TONSILLECTOMY AND ADENOIDECTOMY     WEIL OSTEOTOMY Left 02/02/2018   Procedure: WEIL OSTEOTOMY X 2, REVERSE [place], AND Barbie Banner;  Surgeon: Samara Deist, DPM;  Location: [place];  Service: Podiatry;  Laterality: Left;    Family History: family history includes Alcohol abuse in her maternal uncle; Anxiety disorder in her mother; Breast cancer in her maternal aunt and maternal grandmother; Breast cancer (age of onset: 62) in her mother; Depression in her mother.  Social History:  reports that she quit smoking about 13 years ago. Her smoking use included cigarettes. She has never used smokeless tobacco. She reports  that she does not currently use alcohol. She reports that she does not use drugs.  Current Medications:  Prior to Admission medications   Medication Sig Start Date End Date Taking? Authorizing Provider  amphetamine-dextroamphetamine (ADDERALL) 20 MG tablet Take 1 tablet (20 mg total) by mouth 3 (three) times daily. 08/28/21   Thayer Headings, PMHNP  amphetamine-dextroamphetamine (ADDERALL) 20 MG tablet Take 1 tablet (20 mg total) by mouth in the morning, at noon, and at bedtime. 07/31/21 08/30/21  Thayer Headings, PMHNP  amphetamine-dextroamphetamine (ADDERALL) 20 MG tablet Take 1 tablet (20 mg total) by mouth 3 (three) times daily. 06/23/21 07/23/21  Thayer Headings, PMHNP  ARIPiprazole (ABILIFY) 10 MG tablet Take 1 tablet (10 mg total) by mouth daily. 06/13/21 09/11/21  Thayer Headings, PMHNP  buPROPion (WELLBUTRIN XL) 300 MG 24 hr tablet Take 1 tablet (300 mg total) by mouth daily. 06/13/21   Thayer Headings, PMHNP  Cholecalciferol (VITAMIN D) 50 MCG (2000 UT) tablet Take 2,000 Units by mouth daily.    [provider]  oxyCODONE-acetaminophen (PERCOCET) 10-325 MG tablet Take 1 tablet by mouth every 8 (eight) hours as needed for pain. Must last 30 days. 06/21/21 07/21/21  Gillis Santa, MD  oxyCODONE-acetaminophen (PERCOCET) 10-325 MG tablet Take 1 tablet by mouth every 4 (four) hours as needed for pain.    [provider]  polyethylene glycol (MIRALAX / GLYCOLAX) 17 g packet Take 17 g by mouth daily as needed for moderate constipation.    [provider]  Probiotic Product (PROBIOTIC PO) Take 2 tablets by mouth  daily.    [provider]  tiZANidine (ZANAFLEX) 4 MG tablet Take 4 mg by mouth at bedtime as needed for muscle spasms. 10/05/20   [provider]  traZODone (DESYREL) 150 MG tablet Take 3 tablets (450 mg total) by mouth at bedtime. 01/01/21 04/22/21  Thayer Headings, PMHNP    Allergies:  Allergies as of 07/19/2021   (No Known Allergies)    ROS:   Unable to obtain secondary to patient refusal   Objective:     BP (!) 159/84 (BP Location: Left Arm)    Pulse (!) 101    Temp 98 F (36.7 C) (Oral)    Resp 20    Ht 5\' 9"  (1.753 m)    Wt 105.7 kg    SpO2 99%    BMI 34.41 kg/m   Physical exam not completed secondary to patient refusal  LABS:  CMP Latest Ref Rng & Units 07/19/2021 07/18/2021 12/09/2020  Glucose 70 - 99 mg/dL 172(H) 123(H) 105(H)  BUN 8 - 23 mg/dL 19 18 7(L)  Creatinine 0.44 - 1.00 mg/dL 0.93 1.05(H) 0.66  Sodium 135 - 145 mmol/L 137 139 142  Potassium 3.5 - 5.1 mmol/L 4.4 5.2(H) 3.1(L)  Chloride 98 - 111 mmol/L 103 105 111  CO2 22 - 32 mmol/L 24 25 25   Calcium 8.9 - 10.3 mg/dL 9.8 10.1 8.6(L)  Total Protein 6.5 - 8.1 g/dL 7.4 7.5 -  Total Bilirubin 0.3 - 1.2 mg/dL 1.0 0.8 -  Alkaline Phos 38 - 126 U/L 70 71 -  AST 15 - 41 U/L 27 22 -  ALT 0 - 44 U/L 15 17 -   CBC Latest Ref Rng & Units 07/19/2021 07/18/2021 12/09/2020  WBC 4.0 - 10.5 K/uL 13.6(H) 11.0(H) 3.7(L)  Hemoglobin 12.0 - 15.0 g/dL 16.2(H) 16.0(H) 10.9(L)  Hematocrit 36.0 - 46.0 % 48.3(H) 48.8(H) 32.2(L)  Platelets 150 - 400 K/uL 285 259 164    RADS: CLINICAL DATA:  66 year old female with lower abdominal pain. History of prior small bowel obstruction.   EXAM: CT ABDOMEN AND PELVIS WITH CONTRAST   TECHNIQUE: Multidetector CT imaging of the abdomen and pelvis was performed using the standard protocol following bolus administration of intravenous contrast.   CONTRAST:  165mL OMNIPAQUE IOHEXOL 300 MG/ML  SOLN   COMPARISON:  CT Abdomen and Pelvis 12/05/2020.   FINDINGS: Lower chest: Negative.   Hepatobiliary: Gallstone is 16 mm on series 2, image 31. Gallbladder otherwise within normal limits. Negative liver. No bile duct enlargement.   Pancreas: Negative.   Spleen: Negative. Normal splenule.   Adrenals/Urinary Tract: Normal adrenal glands. Nonobstructed kidneys. Benign appearing left renal lower pole cyst. Symmetric renal enhancement and  contrast excretion. Decompressed ureters. Completely decompressed bladder, although there is a single dot of gas within the decompressed bladder dome on sagittal image 75.   Stomach/Bowel: Redundant large bowel in the pelvis with retained stool increased from last year. Moderate retained stool in the large bowel elsewhere is similar. Redundant transverse colon. Appendix is normal in the right upper quadrant on series 2, image 35, cecum is on a lax mesentery.   Terminal ileum is gas-filled, but just upstream of the TI the small bowel appears indistinct and mildly inflamed on series 2, image 51. Fairly decompressed loop immediately upstream of that with flocculated contents (small bowel stool sign, and then gradual transition (series 2, image 79) to dilated small bowel loops which are mostly fluid-filled throughout the abdomen and pelvis. Similar appearance to the May CT.  Small gastric hiatal hernia. Stomach and duodenum decompressed. Ligament of Treitz decompressed. No free air. Small volume of scattered small bowel mesentery free fluid in the lower abdomen. Some mesenteric inflammation, but no focal or confluent inflamed small bowel.   Vascular/Lymphatic: Calcified aortic atherosclerosis. Major arterial structures are patent. Portal venous system is patent. No lymphadenopathy identified.   Reproductive: Surgically absent uterus. Diminutive or absent ovaries.   Other: Small volume free fluid in the pelvis with simple fluid density, similar to the prior exam.   Musculoskeletal: Mild lumbar spondylolisthesis with facet degeneration. L1 superior endplate deformity is new since May, but most resembles a Schmorl's node (series 6, image 68). No other No acute osseous abnormality identified.   IMPRESSION: 1. Recurrent small bowel obstruction, although with no high-grade transition point, and abnormal bowel extending to the terminal ileum which appears mildly inflamed. Abundant  small bowel feces sign upstream of the TI including in some of the dilated loops. Perhaps then this reflects bowel obstruction secondary to Inflammatory Bowel Disease (such as Crohn disease). There is a small volume of free fluid in the small bowel mesentery in the pelvis, but no free air.   2. Redundant large bowel with increased retained stool compared to the CT last year.   3. Trace gas within decompressed urinary bladder, cannot exclude gas-forming UTI.   4. Cholelithiasis.   5. L1 superior endplate deformity is new since last year, but most resembles a Schmorl's node.   6. Aortic Atherosclerosis (ICD10-I70.0).     Electronically Signed   By: Genevie Ann M.D.   On: 07/19/2021 04:36   Assessment:   Recurrent small bowel obstruction  Plan:    Recommended continue NG decompression and n.p.o. bowel rest.  Patient at the time is refusing to stay in the hospital, stating she is tired of being on the stretcher and the NG tube that was placed is constantly giving her discomfort.  I explained to her the possible consequences of not continuing further care including worsening symptoms and that there is no guarantee that her current situation will improve by going to a different hospital.  Patient verbalized understanding and still wishes to leave [place].  Primary hospitalist at bedside as well during this conversation and she again requested AMA.  Surgery will sign off.

## 2021-07-19 NOTE — ED Notes (Signed)
Patient yelling out pulled off BP cuff states I am not leaving it on. Tried to explain why she needed it on. Retaped NG so to be pulled out.

## 2021-07-21 ENCOUNTER — Encounter: Payer: Medicare HMO | Admitting: Student in an Organized Health Care Education/Training Program

## 2021-07-22 ENCOUNTER — Encounter: Payer: Self-pay | Admitting: Student in an Organized Health Care Education/Training Program

## 2021-07-22 ENCOUNTER — Ambulatory Visit
Payer: Medicare HMO | Attending: Student in an Organized Health Care Education/Training Program | Admitting: Student in an Organized Health Care Education/Training Program

## 2021-07-22 ENCOUNTER — Other Ambulatory Visit: Payer: Self-pay

## 2021-07-22 VITALS — BP 151/90 | HR 102 | Temp 97.3°F | Resp 16 | Ht 68.0 in | Wt 219.0 lb

## 2021-07-22 DIAGNOSIS — F325 Major depressive disorder, single episode, in full remission: Secondary | ICD-10-CM | POA: Diagnosis not present

## 2021-07-22 DIAGNOSIS — F5104 Psychophysiologic insomnia: Secondary | ICD-10-CM | POA: Diagnosis not present

## 2021-07-22 DIAGNOSIS — M549 Dorsalgia, unspecified: Secondary | ICD-10-CM | POA: Diagnosis not present

## 2021-07-22 DIAGNOSIS — M797 Fibromyalgia: Secondary | ICD-10-CM | POA: Diagnosis not present

## 2021-07-22 DIAGNOSIS — G894 Chronic pain syndrome: Secondary | ICD-10-CM | POA: Insufficient documentation

## 2021-07-22 DIAGNOSIS — M545 Low back pain, unspecified: Secondary | ICD-10-CM | POA: Diagnosis not present

## 2021-07-22 DIAGNOSIS — G44321 Chronic post-traumatic headache, intractable: Secondary | ICD-10-CM | POA: Diagnosis not present

## 2021-07-22 DIAGNOSIS — M7918 Myalgia, other site: Secondary | ICD-10-CM | POA: Diagnosis not present

## 2021-07-22 DIAGNOSIS — M25552 Pain in left hip: Secondary | ICD-10-CM | POA: Diagnosis not present

## 2021-07-22 DIAGNOSIS — Y92410 Unspecified street and highway as the place of occurrence of the external cause: Secondary | ICD-10-CM | POA: Insufficient documentation

## 2021-07-22 DIAGNOSIS — S12110G Anterior displaced Type II dens fracture, subsequent encounter for fracture with delayed healing: Secondary | ICD-10-CM | POA: Diagnosis not present

## 2021-07-22 DIAGNOSIS — S12100G Unspecified displaced fracture of second cervical vertebra, subsequent encounter for fracture with delayed healing: Secondary | ICD-10-CM | POA: Insufficient documentation

## 2021-07-22 DIAGNOSIS — M542 Cervicalgia: Secondary | ICD-10-CM | POA: Diagnosis not present

## 2021-07-22 DIAGNOSIS — M25551 Pain in right hip: Secondary | ICD-10-CM | POA: Diagnosis not present

## 2021-07-22 MED ORDER — OXYCODONE-ACETAMINOPHEN 10-325 MG PO TABS: 1.0000 | ORAL_TABLET | Freq: Three times a day (TID) | ORAL | 0 refills | Status: DC | PRN

## 2021-07-22 MED ORDER — OXYCODONE-ACETAMINOPHEN 10-325 MG PO TABS: 1.0000 | ORAL_TABLET | Freq: Three times a day (TID) | ORAL | 0 refills | Status: AC | PRN

## 2021-07-22 NOTE — Progress Notes (Signed)
Nursing Pain Medication Assessment:  Safety precautions to be maintained throughout the outpatient stay will include: orient to surroundings, keep bed in low position, maintain call bell within reach at all times, provide assistance with transfer out of bed and ambulation.  Medication Inspection Compliance: Gina Wallace did not comply with our request to bring her pills to be counted. She was reminded that bringing the medication bottles, even when empty, is a requirement.  Medication: None brought in. Pill/Patch Count: None available to be counted. Bottle Appearance: No container available. Did not bring bottle(s) to appointment. Filled Date: N/A Last Medication intake:  Day before yesterday
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
## 2021-08-05 ENCOUNTER — Telehealth: Payer: Self-pay | Admitting: Psychiatry

## 2021-08-05 NOTE — Telephone Encounter (Signed)
Would you be able to help with this? We have not needed to do a direct referral to them in the past.

## 2021-08-05 NOTE — Telephone Encounter (Signed)
Pt LVM stating J.Eulas Post referred her to [facility] for therapy. They are only taking referrals and need paperwork sent to them for referral. Pt contact # [phone]

## 2021-08-06 NOTE — Telephone Encounter (Signed)
Yes, will do

## 2021-08-07 NOTE — Telephone Encounter (Signed)
Ok, thanks.

## 2021-08-07 NOTE — Telephone Encounter (Signed)
LVM for Pt to RTC.   All I see in notes on last apt 06/12/21 was ref to [facility]. Is that [facility]? Don't see anything about [facility]. Not sure where to refer.

## 2021-08-07 NOTE — Telephone Encounter (Signed)
We had discussed at last visit a referral to a therapist with [facility] since they have providers that accept Medicare and they offer virtual visits, which would be helpful since she sometimes has difficulty with long drives.

## 2021-09-15 ENCOUNTER — Ambulatory Visit: Payer: Medicare HMO | Admitting: Psychologist

## 2021-09-29 DIAGNOSIS — F319 Bipolar disorder, unspecified: Secondary | ICD-10-CM | POA: Diagnosis not present

## 2021-09-29 DIAGNOSIS — G8929 Other chronic pain: Secondary | ICD-10-CM | POA: Diagnosis not present

## 2021-09-29 DIAGNOSIS — I10 Essential (primary) hypertension: Secondary | ICD-10-CM | POA: Diagnosis not present

## 2021-10-01 ENCOUNTER — Ambulatory Visit: Payer: Medicare HMO | Admitting: Psychologist

## 2021-10-16 ENCOUNTER — Encounter: Payer: Self-pay | Admitting: Student in an Organized Health Care Education/Training Program

## 2021-10-16 ENCOUNTER — Other Ambulatory Visit: Payer: Self-pay

## 2021-10-16 ENCOUNTER — Ambulatory Visit
Payer: Medicare HMO | Attending: Student in an Organized Health Care Education/Training Program | Admitting: Student in an Organized Health Care Education/Training Program

## 2021-10-16 VITALS — BP 143/82 | HR 72 | Temp 97.4°F | Resp 16 | Ht 68.0 in | Wt 214.0 lb

## 2021-10-16 DIAGNOSIS — M7918 Myalgia, other site: Secondary | ICD-10-CM | POA: Diagnosis not present

## 2021-10-16 DIAGNOSIS — G894 Chronic pain syndrome: Secondary | ICD-10-CM | POA: Insufficient documentation

## 2021-10-16 DIAGNOSIS — M797 Fibromyalgia: Secondary | ICD-10-CM | POA: Insufficient documentation

## 2021-10-16 DIAGNOSIS — M542 Cervicalgia: Secondary | ICD-10-CM | POA: Diagnosis not present

## 2021-10-16 MED ORDER — OXYCODONE-ACETAMINOPHEN 10-325 MG PO TABS: 1.0000 | ORAL_TABLET | Freq: Three times a day (TID) | ORAL | 0 refills | Status: AC | PRN

## 2021-10-16 MED ORDER — OXYCODONE-ACETAMINOPHEN 10-325 MG PO TABS: 1.0000 | ORAL_TABLET | Freq: Three times a day (TID) | ORAL | 0 refills | Status: DC | PRN

## 2021-10-16 NOTE — Progress Notes (Signed)
Nursing Pain Medication Assessment:  ?Safety precautions to be maintained throughout the outpatient stay will include: orient to surroundings, keep bed in low position, maintain call bell within reach at all times, provide assistance with transfer out of bed and ambulation.  ?Medication Inspection Compliance: Gina Wallace did not comply with our request to bring her pills to be counted. She was reminded that bringing the medication bottles, even when empty, is a requirement. ? ?Medication: None brought in. ?Pill/Patch Count: None available to be counted. ?Bottle Appearance: No container available. Did not bring bottle(s) to appointment. ?Filled Date: N/A ?Last Medication intake:   approx  1 1/2 weeks ago  ?
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
## 2021-11-06 ENCOUNTER — Encounter: Payer: Self-pay | Admitting: Psychiatry

## 2021-11-06 ENCOUNTER — Ambulatory Visit (INDEPENDENT_AMBULATORY_CARE_PROVIDER_SITE_OTHER): Payer: Medicare HMO | Admitting: Psychiatry

## 2021-11-06 DIAGNOSIS — F901 Attention-deficit hyperactivity disorder, predominantly hyperactive type: Secondary | ICD-10-CM

## 2021-11-06 DIAGNOSIS — G4700 Insomnia, unspecified: Secondary | ICD-10-CM | POA: Diagnosis not present

## 2021-11-06 DIAGNOSIS — F3130 Bipolar disorder, current episode depressed, mild or moderate severity, unspecified: Secondary | ICD-10-CM | POA: Diagnosis not present

## 2021-11-06 MED ORDER — AMPHETAMINE-DEXTROAMPHETAMINE 20 MG PO TABS: 20.0000 mg | ORAL_TABLET | Freq: Three times a day (TID) | ORAL | 0 refills | Status: DC

## 2021-11-06 MED ORDER — LURASIDONE HCL 40 MG PO TABS: ORAL_TABLET | ORAL | 1 refills | Status: DC

## 2021-11-06 MED ORDER — TRAZODONE HCL 150 MG PO TABS: 450.0000 mg | ORAL_TABLET | Freq: Every day | ORAL | 2 refills | Status: DC
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
## 2021-12-04 ENCOUNTER — Other Ambulatory Visit: Payer: Self-pay | Admitting: Psychiatry

## 2021-12-04 DIAGNOSIS — G4700 Insomnia, unspecified: Secondary | ICD-10-CM

## 2021-12-10 ENCOUNTER — Telehealth: Payer: Self-pay | Admitting: Psychiatry

## 2021-12-10 NOTE — Telephone Encounter (Signed)
Referral has been sent to [facility] in [place], [place] for therapy.

## 2021-12-19 ENCOUNTER — Ambulatory Visit: Payer: Medicare HMO | Admitting: Psychiatry

## 2022-01-07 ENCOUNTER — Other Ambulatory Visit: Payer: Self-pay | Admitting: Psychiatry

## 2022-01-07 DIAGNOSIS — F3130 Bipolar disorder, current episode depressed, mild or moderate severity, unspecified: Secondary | ICD-10-CM

## 2022-01-07 NOTE — Telephone Encounter (Signed)
Pt would like refill of Lurasadone sent to [facility] in [place].  She is taking last pill today.  She thought it had been sent in earlier.  Next appt 7/12

## 2022-01-09 ENCOUNTER — Other Ambulatory Visit: Payer: Self-pay | Admitting: Internal Medicine

## 2022-01-09 DIAGNOSIS — Z1231 Encounter for screening mammogram for malignant neoplasm of breast: Secondary | ICD-10-CM

## 2022-01-15 ENCOUNTER — Ambulatory Visit
Payer: Medicare HMO | Attending: Student in an Organized Health Care Education/Training Program | Admitting: Student in an Organized Health Care Education/Training Program

## 2022-01-15 ENCOUNTER — Encounter: Payer: Self-pay | Admitting: Student in an Organized Health Care Education/Training Program

## 2022-01-15 VITALS — BP 136/69 | HR 62 | Temp 97.2°F | Resp 16 | Ht 68.0 in | Wt 222.0 lb

## 2022-01-15 DIAGNOSIS — M7918 Myalgia, other site: Secondary | ICD-10-CM | POA: Diagnosis not present

## 2022-01-15 DIAGNOSIS — M542 Cervicalgia: Secondary | ICD-10-CM | POA: Diagnosis not present

## 2022-01-15 DIAGNOSIS — M797 Fibromyalgia: Secondary | ICD-10-CM | POA: Insufficient documentation

## 2022-01-15 DIAGNOSIS — G894 Chronic pain syndrome: Secondary | ICD-10-CM | POA: Insufficient documentation

## 2022-01-15 MED ORDER — OXYCODONE-ACETAMINOPHEN 10-325 MG PO TABS: 1.0000 | ORAL_TABLET | Freq: Three times a day (TID) | ORAL | 0 refills | Status: AC | PRN

## 2022-01-15 MED ORDER — TIZANIDINE HCL 4 MG PO TABS: 4.0000 mg | ORAL_TABLET | Freq: Every evening | ORAL | 2 refills | Status: DC | PRN

## 2022-01-15 NOTE — Progress Notes (Signed)
Nursing Pain Medication Assessment:  Safety precautions to be maintained throughout the outpatient stay will include: orient to surroundings, keep bed in low position, maintain call bell within reach at all times, provide assistance with transfer out of bed and ambulation.  Medication Inspection Compliance: Pill count conducted under aseptic conditions, in front of the patient. Neither the pills nor the bottle was removed from the patient's sight at any time. Once count was completed pills were immediately returned to the patient in their original bottle.  Medication: Oxycodone/APAP Pill/Patch Count:  07 of 90 pills remain Pill/Patch Appearance: Markings consistent with prescribed medication Bottle Appearance: Standard pharmacy container. Clearly labeled. Filled Date: 06 / 02 / 2023 Last Medication intake:  Yesterday
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
## 2022-01-21 ENCOUNTER — Ambulatory Visit (INDEPENDENT_AMBULATORY_CARE_PROVIDER_SITE_OTHER): Payer: Medicare HMO | Admitting: Psychiatry

## 2022-01-21 ENCOUNTER — Encounter: Payer: Self-pay | Admitting: Psychiatry

## 2022-01-21 DIAGNOSIS — F901 Attention-deficit hyperactivity disorder, predominantly hyperactive type: Secondary | ICD-10-CM | POA: Diagnosis not present

## 2022-01-21 DIAGNOSIS — F3130 Bipolar disorder, current episode depressed, mild or moderate severity, unspecified: Secondary | ICD-10-CM | POA: Diagnosis not present

## 2022-01-21 MED ORDER — LURASIDONE HCL 60 MG PO TABS: 60.0000 mg | ORAL_TABLET | Freq: Every day | ORAL | 0 refills | Status: DC

## 2022-01-21 MED ORDER — AMPHETAMINE-DEXTROAMPHETAMINE 20 MG PO TABS: 20.0000 mg | ORAL_TABLET | Freq: Three times a day (TID) | ORAL | 0 refills | Status: DC

## 2022-01-21 NOTE — Progress Notes (Signed)
Gina Wallace Near [id number] 12-31-1955 66 y.o.  Virtual Visit via Telephone Note  I connected with pt on 01/21/22 at 10:30 AM EDT by telephone and verified that I am speaking with the correct person using two identifiers.   I discussed the limitations, risks, security and privacy concerns of performing an evaluation and management service by telephone and the availability of in person appointments. I also discussed with the patient that there may be a patient responsible charge related to this service. The patient expressed understanding and agreed to proceed.   I discussed the assessment and treatment plan with the patient. The patient was provided an opportunity to ask questions and all were answered. The patient agreed with the plan and demonstrated an understanding of the instructions.   The patient was advised to call back or seek an in-person evaluation if the symptoms worsen or if the condition fails to improve as anticipated.  I provided 17 minutes of non-face-to-face time during this encounter.  The patient was located at home.  The provider was located at [place].   Thayer Headings, PMHNP   [name] is a 66 y.o. (DOB July 19, 1955) female.  Chief Complaint:  Chief Complaint  Patient presents with   Depression   Anxiety   ADHD    HPI Gina Wallace presents for follow-up of depression, anxiety, insomnia, and ADHD. She reports that [place] has been "really, really well... an overall awakening... I just feel more normal." She reports that depression has improved and continues to have some residual depression. She reports that she is completing tasks and not procrastinating as much. Concentration has been improved. Improved energy and motivation. She denies excessive irritability. She reports that her anxiety has been improved. She reports that she tends to stay at home and avoid social situations. She reports that she  went somewhere a couple of weeks ago that she did not want to go and ended up enjoying herself. She reports that she is going out of the house slightly more often. Denies panic attacks. She has not noticed any recent PTSD s/s. Denies any recent nightmares or night terrors. Sleeping well. Appetite has been "really good." She reports some emotional eating in response to stressors. Denies impulsive or risky behavior. She reports that she has chronic over-spending. Denies SI.   Raising her 55 yo daughters.   She reports that referral to [facility] did not work out due to organization having difficulty following through.   Review of Systems:  Review of Systems  Musculoskeletal:  Negative for gait problem.  Neurological:  Negative for tremors.       Denies involuntary movements  Psychiatric/Behavioral:         Please refer to HPI    Medications: I have reviewed the patient's current medications.  Current Outpatient Medications  Medication Sig Dispense Refill   amLODipine (NORVASC) 5 MG tablet Take 5 mg by mouth daily.     Cholecalciferol (VITAMIN D) 50 MCG (2000 UT) tablet Take 2,000 Units by mouth daily.     Multiple Vitamins-Minerals (PRESERVISION AREDS PO) Take by mouth.     oxyCODONE-acetaminophen (PERCOCET) 10-325 MG tablet Take 1 tablet by mouth every 8 (eight) hours as needed for pain. 90 tablet 0   polyethylene glycol (MIRALAX / GLYCOLAX) 17 g packet Take 17 g by mouth daily as needed for moderate constipation.     tiZANidine (ZANAFLEX) 4 MG tablet Take 1 tablet (4 mg total) by mouth at  bedtime as needed for muscle spasms. 90 tablet 2   traZODone (DESYREL) 150 MG tablet Take 3 tablets (450 mg total) by mouth at bedtime. 270 tablet 2   [START ON 04/09/2022] amphetamine-dextroamphetamine (ADDERALL) 20 MG tablet Take 1 tablet (20 mg total) by mouth 3 (three) times daily. 90 tablet 0   [START ON 03/12/2022] amphetamine-dextroamphetamine (ADDERALL) 20 MG tablet Take 1 tablet (20 mg total)  by mouth in the morning, at noon, and at bedtime. 90 tablet 0   [START ON 02/12/2022] amphetamine-dextroamphetamine (ADDERALL) 20 MG tablet Take 1 tablet (20 mg total) by mouth 3 (three) times daily. 90 tablet 0   lurasidone 60 MG TABS Take 1 tablet (60 mg total) by mouth daily with supper. 90 tablet 0   [START ON 02/14/2022] oxyCODONE-acetaminophen (PERCOCET) 10-325 MG tablet Take 1 tablet by mouth every 8 (eight) hours as needed for pain. 90 tablet 0   [START ON 03/16/2022] oxyCODONE-acetaminophen (PERCOCET) 10-325 MG tablet Take 1 tablet by mouth every 8 (eight) hours as needed for pain. 90 tablet 0   Probiotic Product (PROBIOTIC PO) Take 2 tablets by mouth daily. (Patient not taking: Reported on 11/06/2021)     No current facility-administered medications for this visit.    Medication Side Effects: None  Allergies: No Known Allergies  Past Medical History:  Diagnosis Date   ADHD (attention deficit hyperactivity disorder)    Anxiety    Bipolar disorder (HCC)    Carpal tunnel syndrome    Complication of anesthesia    pt requires larger doses of meds for effect   Depression    Fibromyalgia    Hypertension    Macular degeneration    Vitamin D deficiency    Wears dentures    full upper and lower    Family History  Problem Relation Age of Onset   Anxiety disorder Mother    Depression Mother    Breast cancer Mother 21   Alcohol abuse Maternal Uncle    Breast cancer Maternal Aunt    Breast cancer Maternal Grandmother     Social History   Socioeconomic History   Marital status: Married    Spouse name: sam   Number of children: 2   Years of education: Not on file   Highest education level: Associate degree: occupational, Hotel manager, or vocational program  Occupational History   Not on file  Tobacco Use   Smoking status: Former    Types: Cigarettes    Quit date: 12/14/2007    Years since quitting: 14.1   Smokeless tobacco: Never  Vaping Use   Vaping Use: Never used   Substance and Sexual Activity   Alcohol use: Not Currently    Comment: may have drink on Holidays   Drug use: Never   Sexual activity: Yes    Partners: Male    Birth control/protection: None  Other Topics Concern   Not on file  Social History Narrative   Not on file   Social Determinants of Health   Financial Resource Strain: Low Risk  (10/18/2017)   Overall Financial Resource Strain (CARDIA)    Difficulty of Paying Living Expenses: Not hard at all  Food Insecurity: No Food Insecurity (10/18/2017)   Hunger Vital Sign    Worried About Running Out of Food in the Last Year: Never true    Ran Out of Food in the Last Year: Never true  Transportation Needs: No Transportation Needs (10/18/2017)   PRAPARE - Hydrologist (Medical): No  Lack of Transportation (Non-Medical): No  Physical Activity: Inactive (10/18/2017)   Exercise Vital Sign    Days of Exercise per Week: 0 days    Minutes of Exercise per Session: 0 min  Stress: Stress Concern Present (10/18/2017)   [place]    Feeling of Stress : Very much  Social Connections: Unknown (10/18/2017)   Social Connection and Isolation Panel [NHANES]    Frequency of Communication with Friends and Family: Not on file    Frequency of Social Gatherings with Friends and Family: Not on file    Attends Religious Services: Never    Active Member of Clubs or Organizations: No    Attends Archivist Meetings: Never    Marital Status: Married  Human resources officer Violence: Not At Risk (10/18/2017)   Humiliation, Afraid, Rape, and Kick questionnaire    Fear of Current or Ex-Partner: No    Emotionally Abused: No    Physically Abused: No    Sexually Abused: No    Past Medical History, Surgical history, Social history, and Family history were reviewed and updated as appropriate.   Please see review of systems for further details on the patient's review from  today.   Objective:   Physical Exam:  BP 136/69   Pulse 62   Physical Exam Constitutional:      General: She is not in acute distress. Musculoskeletal:        General: No deformity.  Neurological:     Mental Status: She is alert and oriented to person, place, and time.     Coordination: Coordination normal.  Psychiatric:        Attention and Perception: Attention and perception normal. She does not perceive auditory or visual hallucinations.        Mood and Affect: Mood is depressed. Mood is not anxious. Affect is not labile, blunt, angry or inappropriate.        Speech: Speech normal.        Behavior: Behavior normal.        Thought Content: Thought content normal. Thought content is not paranoid or delusional. Thought content does not include homicidal or suicidal ideation. Thought content does not include homicidal or suicidal plan.        Cognition and Memory: Cognition and memory normal.        Judgment: Judgment normal.     Comments: Insight intact     Lab Review:     Component Value Date/Time   NA 137 07/19/2021 0350   K 4.4 07/19/2021 0350   CL 103 07/19/2021 0350   CO2 24 07/19/2021 0350   GLUCOSE 172 (H) 07/19/2021 0350   BUN 19 07/19/2021 0350   CREATININE 0.93 07/19/2021 0350   CALCIUM 9.8 07/19/2021 0350   PROT 7.4 07/19/2021 0350   ALBUMIN 4.4 07/19/2021 0350   AST 27 07/19/2021 0350   ALT 15 07/19/2021 0350   ALKPHOS 70 07/19/2021 0350   BILITOT 1.0 07/19/2021 0350   GFRNONAA >60 07/19/2021 0350       Component Value Date/Time   WBC 13.6 (H) 07/19/2021 0350   RBC 5.42 (H) 07/19/2021 0350   HGB 16.2 (H) 07/19/2021 0350   HCT 48.3 (H) 07/19/2021 0350   PLT 285 07/19/2021 0350   MCV 89.1 07/19/2021 0350   MCH 29.9 07/19/2021 0350   MCHC 33.5 07/19/2021 0350   RDW 12.9 07/19/2021 0350   LYMPHSABS 0.9 12/05/2020 0726   MONOABS 0.4 12/05/2020 0726  EOSABS 0.0 12/05/2020 0726   BASOSABS 0.0 12/05/2020 0726    No results found for: "POCLITH",  "LITHIUM"   No results found for: "PHENYTOIN", "PHENOBARB", "VALPROATE", "CBMZ"   .res Assessment: Plan:   Will increase Latuda to 60 mg po qd to target residual depressive s/s since pt reports improved mood and anxiety with 40 mg dose.  Will continue Adderall 20 mg three times daily for ADHD.  Continue Trazodone 450 mg po QHS for insomnia and depression.  Pt to follow-up in 3 months or sooner if clinically indicated.  Patient advised to contact office with any questions, adverse effects, or acute worsening in signs and symptoms.  Gina Wallace was seen today for depression, anxiety and adhd.  Diagnoses and all orders for this visit:  Bipolar affective disorder, current episode depressed, current episode severity unspecified (HCC) -     lurasidone 60 MG TABS; Take 1 tablet (60 mg total) by mouth daily with supper.  Attention deficit hyperactivity disorder (ADHD), predominantly hyperactive type -     amphetamine-dextroamphetamine (ADDERALL) 20 MG tablet; Take 1 tablet (20 mg total) by mouth 3 (three) times daily. -     amphetamine-dextroamphetamine (ADDERALL) 20 MG tablet; Take 1 tablet (20 mg total) by mouth in the morning, at noon, and at bedtime. -     amphetamine-dextroamphetamine (ADDERALL) 20 MG tablet; Take 1 tablet (20 mg total) by mouth 3 (three) times daily.    Please see After Visit Summary for patient specific instructions.  Future Appointments  Date Time Provider [place]  02/05/2022  9:00 AM [facility]  04/09/2022 10:40 AM Gillis Santa, MD [facility] None  04/23/2022  1:00 PM Thayer Headings, PMHNP [facility] None    No orders of the defined types were placed in this encounter.     -------------------------------

## 2022-04-02 DIAGNOSIS — M19041 Primary osteoarthritis, right hand: Secondary | ICD-10-CM | POA: Diagnosis not present

## 2022-04-02 DIAGNOSIS — Z0000 Encounter for general adult medical examination without abnormal findings: Secondary | ICD-10-CM | POA: Diagnosis not present

## 2022-04-02 DIAGNOSIS — Z1331 Encounter for screening for depression: Secondary | ICD-10-CM | POA: Diagnosis not present

## 2022-04-02 DIAGNOSIS — I10 Essential (primary) hypertension: Secondary | ICD-10-CM | POA: Diagnosis not present

## 2022-04-02 DIAGNOSIS — F319 Bipolar disorder, unspecified: Secondary | ICD-10-CM | POA: Diagnosis not present

## 2022-04-02 DIAGNOSIS — R0789 Other chest pain: Secondary | ICD-10-CM | POA: Diagnosis not present

## 2022-04-02 DIAGNOSIS — Z1231 Encounter for screening mammogram for malignant neoplasm of breast: Secondary | ICD-10-CM | POA: Diagnosis not present

## 2022-04-07 DIAGNOSIS — M19041 Primary osteoarthritis, right hand: Secondary | ICD-10-CM | POA: Diagnosis not present

## 2022-04-07 DIAGNOSIS — M778 Other enthesopathies, not elsewhere classified: Secondary | ICD-10-CM | POA: Diagnosis not present

## 2022-04-07 DIAGNOSIS — M1811 Unilateral primary osteoarthritis of first carpometacarpal joint, right hand: Secondary | ICD-10-CM | POA: Diagnosis not present

## 2022-04-09 ENCOUNTER — Encounter: Payer: Medicare HMO | Admitting: Student in an Organized Health Care Education/Training Program

## 2022-04-16 ENCOUNTER — Encounter: Payer: Self-pay | Admitting: Student in an Organized Health Care Education/Training Program

## 2022-04-16 ENCOUNTER — Ambulatory Visit
Payer: Medicare HMO | Attending: Student in an Organized Health Care Education/Training Program | Admitting: Student in an Organized Health Care Education/Training Program

## 2022-04-16 VITALS — BP 116/62 | HR 71 | Temp 97.0°F | Resp 17 | Ht 68.0 in | Wt 220.0 lb

## 2022-04-16 DIAGNOSIS — M542 Cervicalgia: Secondary | ICD-10-CM | POA: Insufficient documentation

## 2022-04-16 DIAGNOSIS — M797 Fibromyalgia: Secondary | ICD-10-CM | POA: Diagnosis not present

## 2022-04-16 DIAGNOSIS — G894 Chronic pain syndrome: Secondary | ICD-10-CM | POA: Diagnosis not present

## 2022-04-16 MED ORDER — OXYCODONE-ACETAMINOPHEN 10-325 MG PO TABS: 1.0000 | ORAL_TABLET | Freq: Three times a day (TID) | ORAL | 0 refills | Status: AC | PRN

## 2022-04-16 MED ORDER — OXYCODONE-ACETAMINOPHEN 10-325 MG PO TABS: 1.0000 | ORAL_TABLET | Freq: Three times a day (TID) | ORAL | 0 refills | Status: DC | PRN
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
## 2022-04-16 NOTE — Progress Notes (Signed)
Nursing Pain Medication Assessment:  Safety precautions to be maintained throughout the outpatient stay will include: orient to surroundings, keep bed in low position, maintain call bell within reach at all times, provide assistance with transfer out of bed and ambulation.  Medication Inspection Compliance: Pill count conducted under aseptic conditions, in front of the patient. Neither the pills nor the bottle was removed from the patient's sight at any time. Once count was completed pills were immediately returned to the patient in their original bottle.  Medication: Oxycodone/APAP Pill/Patch Count:  31 of 90 pills remain Pill/Patch Appearance: Markings consistent with prescribed medication Bottle Appearance: Standard pharmacy container. Clearly labeled. Filled Date: 09 / 11 / 2023 Last Medication intake:  Yesterday

## 2022-04-20 LAB — TOXASSURE SELECT 13 (MW), URINE

## 2022-04-21 ENCOUNTER — Telehealth: Payer: Self-pay | Admitting: *Deleted

## 2022-04-21 NOTE — Telephone Encounter (Signed)
Patient returned our call. I informed her of the UDS results, instructed her to return to our office this week for repeat UDS.

## 2022-04-21 NOTE — Telephone Encounter (Signed)
-----   Message from Gillis Santa, MD sent at 04/21/2022 10:52 AM EDT ----- Regarding: please call pt to discuss UDS Please call patient and inform her of her UDS She needs to come in for a repeat this week If still positive for [facility] then will need to wean or transition to butrans ----- Message ----- From: Interface, [facility] Lab Results In Sent: 04/20/2022   4:36 PM EDT To: Gillis Santa, MD

## 2022-04-21 NOTE — Telephone Encounter (Signed)
Attempted to call patient regarding UDS results. Message left asking patient to return our call.

## 2022-04-23 ENCOUNTER — Encounter: Payer: Self-pay | Admitting: Psychiatry

## 2022-04-23 ENCOUNTER — Ambulatory Visit (INDEPENDENT_AMBULATORY_CARE_PROVIDER_SITE_OTHER): Payer: Medicare HMO | Admitting: Psychiatry

## 2022-04-23 DIAGNOSIS — F901 Attention-deficit hyperactivity disorder, predominantly hyperactive type: Secondary | ICD-10-CM | POA: Diagnosis not present

## 2022-04-23 DIAGNOSIS — F3130 Bipolar disorder, current episode depressed, mild or moderate severity, unspecified: Secondary | ICD-10-CM | POA: Diagnosis not present

## 2022-04-23 DIAGNOSIS — G4700 Insomnia, unspecified: Secondary | ICD-10-CM | POA: Diagnosis not present

## 2022-04-23 MED ORDER — AMPHETAMINE-DEXTROAMPHETAMINE 20 MG PO TABS
20.0000 mg | ORAL_TABLET | Freq: Three times a day (TID) | ORAL | 0 refills | Status: DC
Start: 2022-07-16 — End: 2022-12-14

## 2022-04-23 MED ORDER — AMPHETAMINE-DEXTROAMPHETAMINE 20 MG PO TABS: 20.0000 mg | ORAL_TABLET | Freq: Three times a day (TID) | ORAL | 0 refills | Status: DC

## 2022-04-23 MED ORDER — TRAZODONE HCL 150 MG PO TABS: 450.0000 mg | ORAL_TABLET | Freq: Every day | ORAL | 1 refills | Status: DC

## 2022-04-23 MED ORDER — LURASIDONE HCL 80 MG PO TABS: 80.0000 mg | ORAL_TABLET | Freq: Every day | ORAL | 1 refills | Status: DC

## 2022-04-23 NOTE — Progress Notes (Signed)
Gina Wallace [id number] 02-28-1956 66 y.o.  Virtual Visit via Telephone Note  I connected with pt on 04/23/22 at  1:00 PM EDT by telephone and verified that I am speaking with the correct person using two identifiers.   I discussed the limitations, risks, security and privacy concerns of performing an evaluation and management service by telephone and the availability of in person appointments. I also discussed with the patient that there may be a patient responsible charge related to this service. The patient expressed understanding and agreed to proceed.   I discussed the assessment and treatment plan with the patient. The patient was provided an opportunity to ask questions and all were answered. The patient agreed with the plan and demonstrated an understanding of the instructions.   The patient was advised to call back or seek an in-person evaluation if the symptoms worsen or if the condition fails to improve as anticipated.  I provided 15 minutes of non-face-to-face time during this encounter.  The patient was located at home.  The provider was located at [place].   Thayer Headings, PMHNP   [name] is a 66 y.o. (DOB 1955-09-12) female.  Chief Complaint:  Chief Complaint  Patient presents with   Depression    Depression        Gina Wallace presents for follow-up of anxiety, depression, insomnia, and ADHD. She reports that she is doing well overall. She reports that she has noticed some decrease in energy and motivation over the last month. "I feel like I am slipping backwards." She reports Latuda has been helpful overall and asks about increasing dose. She reports some mild irritability and gets "frustrated very easily." She reports that her anxiety is "about the same, maybe a little better." She notices more anxious thoughts if she is more sedentary. She reports that about once a week she will experience middle  of the night awakenings. Concentration is somewhat improved with Latuda. She reports that she has always been "a little scatterbrained." Appetite has been lower. She reports losing 10 lbs unintentionally. She reports early satiety with hiatal hernia. Reports eating an adequate amount. Denies SI.   "Ever since I started [place] I have been upbeat."   Adderall last filled 03/24/22 and getting script filled today.   Review of Systems:  Review of Systems  Cardiovascular:        She reports that she has noticed racing heart on 3 occasions. She reports that her EKG was normal. She is awaiting stress test.  Musculoskeletal:  Negative for gait problem.  Neurological:  Negative for tremors.  Psychiatric/Behavioral:  Positive for depression.        Please refer to HPI    Medications: I have reviewed the patient's current medications.  Current Outpatient Medications  Medication Sig Dispense Refill   [START ON 07/16/2022] amphetamine-dextroamphetamine (ADDERALL) 20 MG tablet Take 1 tablet (20 mg total) by mouth 3 (three) times daily. 90 tablet 0   lurasidone (LATUDA) 80 MG TABS tablet Take 1 tablet (80 mg total) by mouth daily with supper. 90 tablet 1   amLODipine (NORVASC) 5 MG tablet Take 5 mg by mouth daily.     amphetamine-dextroamphetamine (ADDERALL) 20 MG tablet Take 1 tablet (20 mg total) by mouth 3 (three) times daily. 90 tablet 0   [START ON 06/18/2022] amphetamine-dextroamphetamine (ADDERALL) 20 MG tablet Take 1 tablet (20 mg total) by mouth in the morning, at noon, and at bedtime. 90 tablet  0   [START ON 05/21/2022] amphetamine-dextroamphetamine (ADDERALL) 20 MG tablet Take 1 tablet (20 mg total) by mouth 3 (three) times daily. 90 tablet 0   Cholecalciferol (VITAMIN D) 50 MCG (2000 UT) tablet Take 2,000 Units by mouth daily.     Multiple Vitamins-Minerals (PRESERVISION AREDS PO) Take by mouth.     oxyCODONE-acetaminophen (PERCOCET) 10-325 MG tablet Take 1 tablet by mouth every 8 (eight) hours as  needed for pain. 90 tablet 0   [START ON 05/23/2022] oxyCODONE-acetaminophen (PERCOCET) 10-325 MG tablet Take 1 tablet by mouth every 8 (eight) hours as needed for pain. 90 tablet 0   [START ON 06/22/2022] oxyCODONE-acetaminophen (PERCOCET) 10-325 MG tablet Take 1 tablet by mouth every 8 (eight) hours as needed for pain. 90 tablet 0   polyethylene glycol (MIRALAX / GLYCOLAX) 17 g packet Take 17 g by mouth daily as needed for moderate constipation.     Probiotic Product (PROBIOTIC PO) Take 2 tablets by mouth daily. (Patient not taking: Reported on 11/06/2021)     tiZANidine (ZANAFLEX) 4 MG tablet Take 1 tablet (4 mg total) by mouth at bedtime as needed for muscle spasms. 90 tablet 2   traZODone (DESYREL) 150 MG tablet Take 3 tablets (450 mg total) by mouth at bedtime. 270 tablet 1   No current facility-administered medications for this visit.    Medication Side Effects: Other: Nausea on a couple of occasions.  Allergies: No Known Allergies  Past Medical History:  Diagnosis Date   ADHD (attention deficit hyperactivity disorder)    Anxiety    Bipolar disorder (HCC)    Carpal tunnel syndrome    Complication of anesthesia    pt requires larger doses of meds for effect   Depression    Fibromyalgia    Hypertension    Macular degeneration    Vitamin D deficiency    Wears dentures    full upper and lower    Family History  Problem Relation Age of Onset   Anxiety disorder Mother    Depression Mother    Breast cancer Mother 45   Alcohol abuse Maternal Uncle    Breast cancer Maternal Aunt    Breast cancer Maternal Grandmother     Social History   Socioeconomic History   Marital status: Married    Spouse name: sam   Number of children: 2   Years of education: Not on file   Highest education level: Associate degree: occupational, Hotel manager, or vocational program  Occupational History   Not on file  Tobacco Use   Smoking status: Former    Types: Cigarettes    Quit date:  12/14/2007    Years since quitting: 14.3   Smokeless tobacco: Never  Vaping Use   Vaping Use: Never used  Substance and Sexual Activity   Alcohol use: Not Currently    Comment: may have drink on Holidays   Drug use: Never   Sexual activity: Yes    Partners: Male    Birth control/protection: None  Other Topics Concern   Not on file  Social History Narrative   Not on file   Social Determinants of Health   Financial Resource Strain: Low Risk  (10/18/2017)   Overall Financial Resource Strain (CARDIA)    Difficulty of Paying Living Expenses: Not hard at all  Food Insecurity: No Food Insecurity (10/18/2017)   Hunger Vital Sign    Worried About Running Out of Food in the Last Year: Never true    Ran Out of Food in  the Last Year: Never true  Transportation Needs: No Transportation Needs (10/18/2017)   PRAPARE - Hydrologist (Medical): No    Lack of Transportation (Non-Medical): No  Physical Activity: Inactive (10/18/2017)   Exercise Vital Sign    Days of Exercise per Week: 0 days    Minutes of Exercise per Session: 0 min  Stress: Stress Concern Present (10/18/2017)   [place]    Feeling of Stress : Very much  Social Connections: Unknown (10/18/2017)   Social Connection and Isolation Panel [NHANES]    Frequency of Communication with Friends and Family: Not on file    Frequency of Social Gatherings with Friends and Family: Not on file    Attends Religious Services: Never    Active Member of Clubs or Organizations: No    Attends Archivist Meetings: Never    Marital Status: Married  Human resources officer Violence: Not At Risk (10/18/2017)   Humiliation, Afraid, Rape, and Kick questionnaire    Fear of Current or Ex-Partner: No    Emotionally Abused: No    Physically Abused: No    Sexually Abused: No    Past Medical History, Surgical history, Social history, and Family history were reviewed  and updated as appropriate.   Please see review of systems for further details on the patient's review from today.   Objective:   Physical Exam:  BP 116/68   Pulse 71   Physical Exam Constitutional:      General: She is not in acute distress. Musculoskeletal:        General: No deformity.  Neurological:     Mental Status: She is alert and oriented to person, place, and time.     Cranial Nerves: No dysarthria.     Coordination: Coordination normal.  Psychiatric:        Attention and Perception: Attention and perception normal. She does not perceive auditory or visual hallucinations.        Mood and Affect: Mood is not anxious. Affect is not labile, blunt, angry or inappropriate.        Speech: Speech normal.        Behavior: Behavior normal. Behavior is cooperative.        Thought Content: Thought content normal. Thought content is not paranoid or delusional. Thought content does not include homicidal or suicidal ideation. Thought content does not include homicidal or suicidal plan.        Cognition and Memory: Cognition and memory normal.        Judgment: Judgment normal.     Comments: Insight intact Mood is mildly depressed     Lab Review:     Component Value Date/Time   NA 137 07/19/2021 0350   K 4.4 07/19/2021 0350   CL 103 07/19/2021 0350   CO2 24 07/19/2021 0350   GLUCOSE 172 (H) 07/19/2021 0350   BUN 19 07/19/2021 0350   CREATININE 0.93 07/19/2021 0350   CALCIUM 9.8 07/19/2021 0350   PROT 7.4 07/19/2021 0350   ALBUMIN 4.4 07/19/2021 0350   AST 27 07/19/2021 0350   ALT 15 07/19/2021 0350   ALKPHOS 70 07/19/2021 0350   BILITOT 1.0 07/19/2021 0350   GFRNONAA >60 07/19/2021 0350       Component Value Date/Time   WBC 13.6 (H) 07/19/2021 0350   RBC 5.42 (H) 07/19/2021 0350   HGB 16.2 (H) 07/19/2021 0350   HCT 48.3 (H) 07/19/2021 0350   PLT  285 07/19/2021 0350   MCV 89.1 07/19/2021 0350   MCH 29.9 07/19/2021 0350   MCHC 33.5 07/19/2021 0350   RDW 12.9  07/19/2021 0350   LYMPHSABS 0.9 12/05/2020 0726   MONOABS 0.4 12/05/2020 0726   EOSABS 0.0 12/05/2020 0726   BASOSABS 0.0 12/05/2020 0726    No results found for: "POCLITH", "LITHIUM"   No results found for: "PHENYTOIN", "PHENOBARB", "VALPROATE", "CBMZ"   .res Assessment: Plan:    Discussed potential benefits, risks, and side effects of increasing Latuda to 80 mg daily with supper. Pt agrees to increase in [place] to further improve depression, anxiety, and sleep disturbance since she has expereinced a partial improvement in these s/s with lower doses of Latuda.  Will continue Adderall 20 mg TID for ADHD.  Continue Trazodone 450 mg po QHS at bedtime for depression and insomnia.  Pt to follow-up in 3 months or sooner if clinically indicated.  Patient advised to contact office with any questions, adverse effects, or acute worsening in signs and symptoms.   Esta was seen today for depression.  Diagnoses and all orders for this visit:  Bipolar affective disorder, current episode depressed, current episode severity unspecified (HCC) -     lurasidone (LATUDA) 80 MG TABS tablet; Take 1 tablet (80 mg total) by mouth daily with supper.  Insomnia, unspecified type -     traZODone (DESYREL) 150 MG tablet; Take 3 tablets (450 mg total) by mouth at bedtime.  Attention deficit hyperactivity disorder (ADHD), predominantly hyperactive type -     amphetamine-dextroamphetamine (ADDERALL) 20 MG tablet; Take 1 tablet (20 mg total) by mouth in the morning, at noon, and at bedtime. -     amphetamine-dextroamphetamine (ADDERALL) 20 MG tablet; Take 1 tablet (20 mg total) by mouth 3 (three) times daily. -     amphetamine-dextroamphetamine (ADDERALL) 20 MG tablet; Take 1 tablet (20 mg total) by mouth 3 (three) times daily.    Please see After Visit Summary for patient specific instructions.  Future Appointments  Date Time Provider [place]  07/14/2022  9:40 AM Gillis Santa, MD [facility] None   07/30/2022  1:00 PM Thayer Headings, PMHNP [facility] None    No orders of the defined types were placed in this encounter.     -------------------------------

## 2022-04-25 MED ORDER — AMPHETAMINE-DEXTROAMPHETAMINE 20 MG PO TABS: 20.0000 mg | ORAL_TABLET | Freq: Three times a day (TID) | ORAL | 0 refills | Status: DC

## 2022-04-28 ENCOUNTER — Other Ambulatory Visit: Payer: Self-pay | Admitting: *Deleted

## 2022-04-28 ENCOUNTER — Telehealth: Payer: Self-pay | Admitting: Psychiatry

## 2022-04-28 DIAGNOSIS — G894 Chronic pain syndrome: Secondary | ICD-10-CM | POA: Diagnosis not present

## 2022-04-28 NOTE — Telephone Encounter (Signed)
Called pharmacy to see if a PA was needed. They said they ran it thru [facility] as patient did not have any insurance information on file. Called patient and she said she had talked to the pharmacy and they said they ran it thru the insurance. She said she has gotten her prescriptions at [facility] for a while. Patient to call back if any issues.

## 2022-04-28 NOTE — Telephone Encounter (Signed)
Pt Lvm @ 3:16p.  She said the generic Latuda has increased from $30 to $800.  She needs a script for a different medicine.  Next appt 1/18

## 2022-04-28 NOTE — Progress Notes (Signed)
Safety precautions to be maintained throughout the outpatient stay will include: orient to surroundings, keep bed in low position, maintain call bell within reach at all times, provide assistance with transfer out of bed and ambulation.  

## 2022-04-29 ENCOUNTER — Telehealth: Payer: Self-pay | Admitting: Student in an Organized Health Care Education/Training Program

## 2022-04-29 NOTE — Telephone Encounter (Signed)
I contacted [facility], they do have the Rx for Percocet. Patient notified.

## 2022-04-29 NOTE — Telephone Encounter (Signed)
PT stated that her prescription hasn't been call into the pharmacy. It's been over a week in an half that she been calling pharmacy. Prescription supposed to be send to [facility] in [place]. PT asked if someone could give her a call. Thanks

## 2022-05-02 LAB — TOXASSURE SELECT 13 (MW), URINE

## 2022-05-04 DIAGNOSIS — G894 Chronic pain syndrome: Secondary | ICD-10-CM | POA: Diagnosis not present

## 2022-05-04 NOTE — Progress Notes (Signed)
Patient notified, states she found a ride and will come today.

## 2022-05-04 NOTE — Progress Notes (Signed)
Patient needs to come in to repeat urine toxicology screen today.  Order placed.

## 2022-05-04 NOTE — Addendum Note (Signed)
Addended by: Gillis Santa on: 05/04/2022 11:48 AM   Modules accepted: Orders

## 2022-05-04 NOTE — Progress Notes (Signed)
She cannot come today. Can she come tomorrow?

## 2022-05-08 DIAGNOSIS — M778 Other enthesopathies, not elsewhere classified: Secondary | ICD-10-CM | POA: Diagnosis not present

## 2022-05-08 DIAGNOSIS — M19041 Primary osteoarthritis, right hand: Secondary | ICD-10-CM | POA: Diagnosis not present

## 2022-05-08 DIAGNOSIS — M1811 Unilateral primary osteoarthritis of first carpometacarpal joint, right hand: Secondary | ICD-10-CM | POA: Diagnosis not present

## 2022-05-08 LAB — TOXASSURE SELECT 13 (MW), URINE

## 2022-06-02 DIAGNOSIS — R0789 Other chest pain: Secondary | ICD-10-CM | POA: Diagnosis not present

## 2022-06-21 DIAGNOSIS — S0101XA Laceration without foreign body of scalp, initial encounter: Secondary | ICD-10-CM | POA: Diagnosis not present

## 2022-06-21 DIAGNOSIS — S0093XA Contusion of unspecified part of head, initial encounter: Secondary | ICD-10-CM | POA: Diagnosis not present

## 2022-07-02 DIAGNOSIS — Z4802 Encounter for removal of sutures: Secondary | ICD-10-CM | POA: Diagnosis not present

## 2022-07-14 ENCOUNTER — Encounter: Payer: Self-pay | Admitting: Student in an Organized Health Care Education/Training Program

## 2022-07-14 ENCOUNTER — Ambulatory Visit
Payer: Medicare HMO | Attending: Student in an Organized Health Care Education/Training Program | Admitting: Student in an Organized Health Care Education/Training Program

## 2022-07-14 VITALS — BP 160/91 | HR 79 | Temp 97.2°F | Resp 16 | Ht 69.0 in | Wt 214.0 lb

## 2022-07-14 DIAGNOSIS — G894 Chronic pain syndrome: Secondary | ICD-10-CM

## 2022-07-14 DIAGNOSIS — M7918 Myalgia, other site: Secondary | ICD-10-CM | POA: Diagnosis not present

## 2022-07-14 DIAGNOSIS — M797 Fibromyalgia: Secondary | ICD-10-CM

## 2022-07-14 DIAGNOSIS — S12100G Unspecified displaced fracture of second cervical vertebra, subsequent encounter for fracture with delayed healing: Secondary | ICD-10-CM | POA: Diagnosis not present

## 2022-07-14 DIAGNOSIS — M542 Cervicalgia: Secondary | ICD-10-CM | POA: Diagnosis not present

## 2022-07-14 MED ORDER — OXYCODONE-ACETAMINOPHEN 10-325 MG PO TABS: 1.0000 | ORAL_TABLET | Freq: Three times a day (TID) | ORAL | 0 refills | Status: DC | PRN

## 2022-07-14 MED ORDER — OXYCODONE-ACETAMINOPHEN 10-325 MG PO TABS: 1.0000 | ORAL_TABLET | Freq: Three times a day (TID) | ORAL | 0 refills | Status: AC | PRN
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
## 2022-07-14 NOTE — Progress Notes (Signed)
Nursing Pain Medication Assessment:  Safety precautions to be maintained throughout the outpatient stay will include: orient to surroundings, keep bed in low position, maintain call bell within reach at all times, provide assistance with transfer out of bed and ambulation.  Medication Inspection Compliance: Gina Wallace did not comply with our request to bring her pills to be counted. She was reminded that bringing the medication bottles, even when empty, is a requirement.  Medication: None brought in. Pill/Patch Count: None available to be counted. Bottle Appearance: No container available. Did not bring bottle(s) to appointment. Filled Date: N/A Last Medication intake:   3 days ago

## 2022-07-30 ENCOUNTER — Encounter: Payer: Self-pay | Admitting: Psychiatry

## 2022-07-30 ENCOUNTER — Ambulatory Visit (INDEPENDENT_AMBULATORY_CARE_PROVIDER_SITE_OTHER): Payer: Medicare HMO | Admitting: Psychiatry

## 2022-07-30 DIAGNOSIS — F3130 Bipolar disorder, current episode depressed, mild or moderate severity, unspecified: Secondary | ICD-10-CM

## 2022-07-30 DIAGNOSIS — G4700 Insomnia, unspecified: Secondary | ICD-10-CM | POA: Diagnosis not present

## 2022-07-30 DIAGNOSIS — F901 Attention-deficit hyperactivity disorder, predominantly hyperactive type: Secondary | ICD-10-CM | POA: Diagnosis not present

## 2022-07-30 MED ORDER — LURASIDONE HCL 60 MG PO TABS: 60.0000 mg | ORAL_TABLET | Freq: Every day | ORAL | 0 refills | Status: DC

## 2022-07-30 MED ORDER — AMPHETAMINE-DEXTROAMPHETAMINE 20 MG PO TABS: 20.0000 mg | ORAL_TABLET | Freq: Three times a day (TID) | ORAL | 0 refills | Status: DC

## 2022-07-30 MED ORDER — TRAZODONE HCL 150 MG PO TABS: 450.0000 mg | ORAL_TABLET | Freq: Every day | ORAL | 1 refills | Status: DC
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
## 2022-10-05 DIAGNOSIS — H35372 Puckering of macula, left eye: Secondary | ICD-10-CM | POA: Diagnosis not present

## 2022-10-05 DIAGNOSIS — H353134 Nonexudative age-related macular degeneration, bilateral, advanced atrophic with subfoveal involvement: Secondary | ICD-10-CM | POA: Diagnosis not present

## 2022-10-05 DIAGNOSIS — H04123 Dry eye syndrome of bilateral lacrimal glands: Secondary | ICD-10-CM | POA: Diagnosis not present

## 2022-10-05 DIAGNOSIS — H35342 Macular cyst, hole, or pseudohole, left eye: Secondary | ICD-10-CM | POA: Diagnosis not present

## 2022-10-13 ENCOUNTER — Ambulatory Visit
Payer: Medicare HMO | Attending: Student in an Organized Health Care Education/Training Program | Admitting: Student in an Organized Health Care Education/Training Program

## 2022-10-13 ENCOUNTER — Encounter: Payer: Self-pay | Admitting: Student in an Organized Health Care Education/Training Program

## 2022-10-13 VITALS — BP 123/69 | HR 70 | Temp 97.4°F | Resp 17 | Ht 68.0 in | Wt 224.0 lb

## 2022-10-13 DIAGNOSIS — Y92410 Unspecified street and highway as the place of occurrence of the external cause: Secondary | ICD-10-CM | POA: Diagnosis not present

## 2022-10-13 DIAGNOSIS — G894 Chronic pain syndrome: Secondary | ICD-10-CM | POA: Diagnosis not present

## 2022-10-13 DIAGNOSIS — K8020 Calculus of gallbladder without cholecystitis without obstruction: Secondary | ICD-10-CM | POA: Insufficient documentation

## 2022-10-13 DIAGNOSIS — M797 Fibromyalgia: Secondary | ICD-10-CM | POA: Diagnosis not present

## 2022-10-13 DIAGNOSIS — M542 Cervicalgia: Secondary | ICD-10-CM | POA: Diagnosis not present

## 2022-10-13 DIAGNOSIS — I700 Atherosclerosis of aorta: Secondary | ICD-10-CM | POA: Diagnosis not present

## 2022-10-13 DIAGNOSIS — K50012 Crohn's disease of small intestine with intestinal obstruction: Secondary | ICD-10-CM | POA: Insufficient documentation

## 2022-10-13 DIAGNOSIS — M7918 Myalgia, other site: Secondary | ICD-10-CM

## 2022-10-13 MED ORDER — OXYCODONE-ACETAMINOPHEN 10-325 MG PO TABS
1.0000 | ORAL_TABLET | Freq: Three times a day (TID) | ORAL | 0 refills | Status: AC | PRN
Start: 2022-12-31 — End: 2023-01-30

## 2022-10-13 MED ORDER — OXYCODONE-ACETAMINOPHEN 10-325 MG PO TABS
1.0000 | ORAL_TABLET | Freq: Three times a day (TID) | ORAL | 0 refills | Status: AC | PRN
Start: 2022-11-01 — End: 2022-12-01

## 2022-10-13 MED ORDER — OXYCODONE-ACETAMINOPHEN 10-325 MG PO TABS
1.0000 | ORAL_TABLET | Freq: Three times a day (TID) | ORAL | 0 refills | Status: AC | PRN
Start: 2022-12-01 — End: 2022-12-31
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
## 2022-10-13 NOTE — Progress Notes (Signed)
Nursing Pain Medication Assessment:  Safety precautions to be maintained throughout the outpatient stay will include: orient to surroundings, keep bed in low position, maintain call bell within reach at all times, provide assistance with transfer out of bed and ambulation.  Medication Inspection Compliance: Pill count conducted under aseptic conditions, in front of the patient. Neither the pills nor the bottle was removed from the patient's sight at any time. Once count was completed pills were immediately returned to the patient in their original bottle.  Medication: Oxycodone/APAP Pill/Patch Count:  78 of 90 pills remain Pill/Patch Appearance: Markings consistent with prescribed medication Bottle Appearance: Standard pharmacy container. Clearly labeled. Filled Date: 03 / 22 / 2024 Last Medication intake:  Yesterday

## 2022-10-20 DIAGNOSIS — H353134 Nonexudative age-related macular degeneration, bilateral, advanced atrophic with subfoveal involvement: Secondary | ICD-10-CM | POA: Diagnosis not present

## 2022-11-17 DIAGNOSIS — H353134 Nonexudative age-related macular degeneration, bilateral, advanced atrophic with subfoveal involvement: Secondary | ICD-10-CM | POA: Diagnosis not present

## 2022-12-01 DIAGNOSIS — E669 Obesity, unspecified: Secondary | ICD-10-CM | POA: Diagnosis not present

## 2022-12-01 DIAGNOSIS — F909 Attention-deficit hyperactivity disorder, unspecified type: Secondary | ICD-10-CM | POA: Diagnosis not present

## 2022-12-01 DIAGNOSIS — F319 Bipolar disorder, unspecified: Secondary | ICD-10-CM | POA: Diagnosis not present

## 2022-12-01 DIAGNOSIS — I10 Essential (primary) hypertension: Secondary | ICD-10-CM | POA: Diagnosis not present

## 2022-12-02 ENCOUNTER — Other Ambulatory Visit: Payer: Self-pay | Admitting: Psychiatry

## 2022-12-02 DIAGNOSIS — F901 Attention-deficit hyperactivity disorder, predominantly hyperactive type: Secondary | ICD-10-CM

## 2022-12-02 NOTE — Telephone Encounter (Signed)
Pt LVM @ 2:01p.  She is requesting refill of Adderall to   [facility] [address] [facility] - [place], [place] - [address] [place], [place] [address] Phone: [phone]  Fax: [phone]  She is asking for enough to get to her appt.  She is out of meds.  Next appt 6/3

## 2022-12-14 ENCOUNTER — Encounter: Payer: Self-pay | Admitting: Psychiatry

## 2022-12-14 ENCOUNTER — Ambulatory Visit (INDEPENDENT_AMBULATORY_CARE_PROVIDER_SITE_OTHER): Payer: Medicare HMO | Admitting: Psychiatry

## 2022-12-14 VITALS — BP 137/71 | HR 91

## 2022-12-14 DIAGNOSIS — F3130 Bipolar disorder, current episode depressed, mild or moderate severity, unspecified: Secondary | ICD-10-CM | POA: Diagnosis not present

## 2022-12-14 DIAGNOSIS — G4700 Insomnia, unspecified: Secondary | ICD-10-CM | POA: Diagnosis not present

## 2022-12-14 DIAGNOSIS — F901 Attention-deficit hyperactivity disorder, predominantly hyperactive type: Secondary | ICD-10-CM | POA: Diagnosis not present

## 2022-12-14 MED ORDER — AMPHETAMINE-DEXTROAMPHETAMINE 20 MG PO TABS
20.0000 mg | ORAL_TABLET | Freq: Three times a day (TID) | ORAL | 0 refills | Status: DC
Start: 2023-01-28 — End: 2023-03-09

## 2022-12-14 MED ORDER — QUETIAPINE FUMARATE 300 MG PO TABS
300.00 mg | ORAL_TABLET | Freq: Every day | ORAL | 1 refills | Status: DC
Start: 2022-12-14 — End: 2023-01-15

## 2022-12-14 MED ORDER — AMPHETAMINE-DEXTROAMPHETAMINE 20 MG PO TABS
20.0000 mg | ORAL_TABLET | Freq: Three times a day (TID) | ORAL | 0 refills | Status: DC
Start: 2022-12-31 — End: 2023-03-09
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
## 2022-12-15 DIAGNOSIS — H353134 Nonexudative age-related macular degeneration, bilateral, advanced atrophic with subfoveal involvement: Secondary | ICD-10-CM | POA: Diagnosis not present

## 2022-12-22 DIAGNOSIS — I10 Essential (primary) hypertension: Secondary | ICD-10-CM | POA: Diagnosis not present

## 2022-12-22 DIAGNOSIS — F319 Bipolar disorder, unspecified: Secondary | ICD-10-CM | POA: Diagnosis not present

## 2023-01-08 DIAGNOSIS — M1711 Unilateral primary osteoarthritis, right knee: Secondary | ICD-10-CM | POA: Diagnosis not present

## 2023-01-08 DIAGNOSIS — G8929 Other chronic pain: Secondary | ICD-10-CM | POA: Diagnosis not present

## 2023-01-15 ENCOUNTER — Ambulatory Visit (INDEPENDENT_AMBULATORY_CARE_PROVIDER_SITE_OTHER): Payer: Medicare HMO | Admitting: Psychiatry

## 2023-01-15 ENCOUNTER — Encounter: Payer: Self-pay | Admitting: Psychiatry

## 2023-01-15 VITALS — BP 150/86

## 2023-01-15 DIAGNOSIS — G4700 Insomnia, unspecified: Secondary | ICD-10-CM

## 2023-01-15 DIAGNOSIS — F3130 Bipolar disorder, current episode depressed, mild or moderate severity, unspecified: Secondary | ICD-10-CM | POA: Diagnosis not present

## 2023-01-15 DIAGNOSIS — F901 Attention-deficit hyperactivity disorder, predominantly hyperactive type: Secondary | ICD-10-CM | POA: Diagnosis not present

## 2023-01-15 MED ORDER — BUPROPION HCL ER (XL) 300 MG PO TB24
300.0000 mg | ORAL_TABLET | Freq: Every day | ORAL | 1 refills | Status: DC
Start: 2023-01-15 — End: 2023-08-10

## 2023-01-15 MED ORDER — AMPHETAMINE-DEXTROAMPHETAMINE 20 MG PO TABS
20.0000 mg | ORAL_TABLET | Freq: Three times a day (TID) | ORAL | 0 refills | Status: DC
Start: 2023-03-04 — End: 2023-06-16

## 2023-01-15 MED ORDER — CARIPRAZINE HCL 1.5 MG PO CAPS
1.5000 mg | ORAL_CAPSULE | Freq: Every day | ORAL | 1 refills | Status: DC
Start: 2023-01-15 — End: 2023-11-08

## 2023-01-15 MED ORDER — TRAZODONE HCL 150 MG PO TABS
ORAL_TABLET | ORAL | 1 refills | Status: DC
Start: 2023-01-15 — End: 2023-08-05
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
## 2023-01-19 ENCOUNTER — Telehealth: Payer: Self-pay

## 2023-01-19 DIAGNOSIS — H353134 Nonexudative age-related macular degeneration, bilateral, advanced atrophic with subfoveal involvement: Secondary | ICD-10-CM | POA: Diagnosis not present

## 2023-01-19 NOTE — Telephone Encounter (Signed)
[employer] approved VRAYLAR 1.5mg   until 07/13/2023

## 2023-01-19 NOTE — Telephone Encounter (Signed)
Prior Authorization submitted on 01/18/2023 for VRAYLAR 1.5 MG with an approval effective 07/13/2022-07/13/2023 with [place], PA# [id number]

## 2023-01-24 ENCOUNTER — Emergency Department
Admission: EM | Admit: 2023-01-24 | Discharge: 2023-01-24 | Disposition: A | Discharge status: Home / Self Care | Payer: Medicare HMO | Attending: Emergency Medicine | Admitting: Emergency Medicine

## 2023-01-24 ENCOUNTER — Emergency Department: Payer: Medicare HMO

## 2023-01-24 ENCOUNTER — Encounter: Payer: Self-pay | Admitting: Radiology

## 2023-01-24 ENCOUNTER — Other Ambulatory Visit: Payer: Self-pay

## 2023-01-24 DIAGNOSIS — R42 Dizziness and giddiness: Secondary | ICD-10-CM | POA: Diagnosis not present

## 2023-01-24 DIAGNOSIS — I10 Essential (primary) hypertension: Secondary | ICD-10-CM | POA: Diagnosis not present

## 2023-01-24 DIAGNOSIS — R479 Unspecified speech disturbances: Secondary | ICD-10-CM | POA: Diagnosis not present

## 2023-01-24 DIAGNOSIS — R531 Weakness: Secondary | ICD-10-CM | POA: Diagnosis not present

## 2023-01-24 DIAGNOSIS — Z471 Aftercare following joint replacement surgery: Secondary | ICD-10-CM | POA: Diagnosis not present

## 2023-01-24 DIAGNOSIS — R4781 Slurred speech: Secondary | ICD-10-CM | POA: Diagnosis not present

## 2023-01-24 LAB — DIFFERENTIAL
Abs Immature Granulocytes: 0.01 10*3/uL (ref 0.00–0.07)
Basophils Absolute: 0 10*3/uL (ref 0.0–0.1)
Basophils Relative: 1 %
Eosinophils Absolute: 0 10*3/uL (ref 0.0–0.5)
Eosinophils Relative: 0 %
Immature Granulocytes: 0 %
Lymphocytes Relative: 24 %
Lymphs Abs: 1.4 10*3/uL (ref 0.7–4.0)
Monocytes Absolute: 0.4 10*3/uL (ref 0.1–1.0)
Monocytes Relative: 7 %
Neutro Abs: 3.9 10*3/uL (ref 1.7–7.7)
Neutrophils Relative %: 68 %

## 2023-01-24 LAB — COMPREHENSIVE METABOLIC PANEL
ALT: 14 U/L (ref 0–44)
AST: 19 U/L (ref 15–41)
Albumin: 4.5 g/dL (ref 3.5–5.0)
Alkaline Phosphatase: 56 U/L (ref 38–126)
Anion gap: 8 (ref 5–15)
BUN: 23 mg/dL (ref 8–23)
CO2: 24 mmol/L (ref 22–32)
Calcium: 9.1 mg/dL (ref 8.9–10.3)
Chloride: 104 mmol/L (ref 98–111)
Creatinine, Ser: 1.06 mg/dL — ABNORMAL HIGH (ref 0.44–1.00)
GFR, Estimated: 58 mL/min — ABNORMAL LOW (ref >60)
Glucose, Bld: 110 mg/dL — ABNORMAL HIGH (ref 70–99)
Potassium: 3.8 mmol/L (ref 3.5–5.1)
Sodium: 136 mmol/L (ref 135–145)
Total Bilirubin: 0.8 mg/dL (ref 0.3–1.2)
Total Protein: 7 g/dL (ref 6.5–8.1)

## 2023-01-24 LAB — CBC
HCT: 41 % (ref 36.0–46.0)
Hemoglobin: 13.8 g/dL (ref 12.0–15.0)
MCH: 29.5 pg (ref 26.0–34.0)
MCHC: 33.7 g/dL (ref 30.0–36.0)
MCV: 87.6 fL (ref 80.0–100.0)
Platelets: 172 10*3/uL (ref 150–400)
RBC: 4.68 MIL/uL (ref 3.87–5.11)
RDW: 12.9 % (ref 11.5–15.5)
WBC: 5.8 10*3/uL (ref 4.0–10.5)
nRBC: 0 % (ref 0.0–0.2)

## 2023-01-24 MED ORDER — SODIUM CHLORIDE 0.9% FLUSH
3.0000 mL | Freq: Once | INTRAVENOUS | Status: AC
  Administered 2023-01-24: 3 mL via INTRAVENOUS

## 2023-01-24 NOTE — ED Provider Notes (Signed)
[facility] Provider Note    Event Date/Time   First MD Initiated Contact with Patient 01/24/23 2112     (approximate)   History   Chief Complaint Weakness   HPI  Gina Wallace is a 67 y.o. female with past medical history of hypertension, bipolar disorder, and chronic pain syndrome who presents to the ED for weakness.  Patient reports that she went to the grocery store yesterday afternoon and shortly after getting out of the car she had sudden onset of global weakness where she states it felt like all 4 of her extremities became weak and heavy.  She felt lightheaded like she might pass out and felt like her coordination was affected.  She was able to get back in the car and drive herself home, but after arriving home states she had difficulty speaking to her husband.  She states that for about 30 minutes it was hard for her to get words out and she felt nauseous, however a family member gave her a dose of Zofran and she was able to take a nap.  When she woke up, her speech seemed improved but she still had some ongoing lightheadedness and diffuse heavy feeling across her body.  She denies any changes in her vision, states when she woke up this morning all of her symptoms have resolved.  She has not had any chest pain, shortness of breath, vomiting, diarrhea, or dysuria.     Physical Exam   Triage Vital Signs: ED Triage Vitals  Encounter Vitals Group     BP 01/24/23 1521 (!) 157/92     Systolic BP Percentile --      Diastolic BP Percentile --      Pulse Rate 01/24/23 1521 (!) 107     Resp 01/24/23 1521 18     Temp 01/24/23 1521 98 F (36.7 C)     Temp Source 01/24/23 1826 Oral     SpO2 01/24/23 1521 100 %     Weight --      Height --      Head Circumference --      Peak Flow --      Pain Score 01/24/23 1520 0     Pain Loc --      Pain Education --      Exclude from Growth Chart --     Most recent vital signs: Vitals:   01/24/23 1521  01/24/23 1826  BP: (!) 157/92 (!) 148/76  Pulse: (!) 107 87  Resp: 18 18  Temp: 98 F (36.7 C) 98.5 F (36.9 C)  SpO2: 100% 100%    Constitutional: Alert and oriented. Eyes: Conjunctivae are normal. Head: Atraumatic. Nose: No congestion/rhinnorhea. Mouth/Throat: Mucous membranes are moist.  Cardiovascular: Normal rate, regular rhythm. Grossly normal heart sounds.  2+ radial pulses bilaterally. Respiratory: Normal respiratory effort.  No retractions. Lungs CTAB. Gastrointestinal: Soft and nontender. No distention. Musculoskeletal: No lower extremity tenderness nor edema.  Neurologic:  Normal speech and language. No gross focal neurologic deficits are appreciated.    ED Results / Procedures / Treatments   Labs (all labs ordered are listed, but only abnormal results are displayed) Labs Reviewed  COMPREHENSIVE METABOLIC PANEL - Abnormal; Notable for the following components:      Result Value   Glucose, Bld 110 (*)    Creatinine, Ser 1.06 (*)    GFR, Estimated 58 (*)    All other components within normal limits  CBC  DIFFERENTIAL  CBG MONITORING,  ED  I-STAT CREATININE, ED     EKG  ED ECG REPORT I, Chesley Noon, the attending physician, personally viewed and interpreted this ECG.   Date: 01/24/2023  EKG Time: 15:23  Rate: 99  Rhythm: normal sinus rhythm  Axis: Normal  Intervals:right bundle branch block  ST&T Change: None  RADIOLOGY CT head reviewed and interpreted by me with no hemorrhage or midline shift.  PROCEDURES:  Critical Care performed: No  Procedures   MEDICATIONS ORDERED IN ED: Medications  sodium chloride flush (NS) 0.9 % injection 3 mL (3 mLs Intravenous Given 01/24/23 2248)     IMPRESSION / MDM / ASSESSMENT AND PLAN / ED COURSE  I reviewed the triage vital signs and the nursing notes.                              67 y.o. female with past medical history of hypertension, bipolar disorder, chronic pain syndrome who presents with  sudden onset diffuse weakness with lightheadedness, poor coordination, and some speech difficulties.  Patient's presentation is most consistent with acute presentation with potential threat to life or bodily function.  Differential diagnosis includes, but is not limited to, stroke, TIA, anemia, electrolyte abnormality, AKI, UTI, anxiety, arrhythmia.  Patient nontoxic-appearing and in no acute distress, vital signs are unremarkable.  EKG shows no evidence of arrhythmia or ischemia and I have low suspicion for cardiac etiology of her symptoms.  Labs show no significant anemia, leukocytosis, tract abnormality, or AKI.  LFTs are unremarkable.  Patient currently with no focal neurologic deficits on exam, CT head is negative.  Patient is primarily concerned about the possibility of TIA although her symptoms do not seem typical for this given no unilateral weakness and she describes significant lightheadedness with the episode.  We will further assess with MRI brain, if this is unremarkable then plan for outpatient follow-up with neurology.  MRI brain is negative for acute process, patient remains asymptomatic on reassessment.  Although symptoms seem unlikely to represent TIA, patient advised to start taking daily baby aspirin to reduce her stroke risk.  She is appropriate for outpatient follow-up with neurology as well as her PCP.  She was counseled to return to the ED for new or worsening symptoms, patient agrees with plan.      FINAL CLINICAL IMPRESSION(S) / ED DIAGNOSES   Final diagnoses:  Generalized weakness  Lightheadedness     Rx / DC Orders   ED Discharge Orders     None        Note:  This document was prepared using Dragon voice recognition software and may include unintentional dictation errors.   Chesley Noon, MD 01/24/23 2337

## 2023-01-24 NOTE — ED Triage Notes (Signed)
Pt comes with c/o weakness that started last night. Pt states she couldn't speak but did have some slurring when she could. Pt states she didn't have any balance.   Pt currently speaking in clear complete sentences. Pt states she now just feels achy.

## 2023-01-26 ENCOUNTER — Other Ambulatory Visit
Admission: RE | Admit: 2023-01-26 | Discharge: 2023-01-26 | Disposition: A | Discharge status: Home / Self Care | Payer: Medicare HMO | Source: Ambulatory Visit | Attending: Internal Medicine | Admitting: Internal Medicine

## 2023-01-26 DIAGNOSIS — R531 Weakness: Secondary | ICD-10-CM | POA: Diagnosis not present

## 2023-01-26 LAB — D-DIMER, QUANTITATIVE: D-Dimer, Quant: 0.31 ug/mL-FEU (ref 0.00–0.50)

## 2023-01-26 LAB — TROPONIN I (HIGH SENSITIVITY): Troponin I (High Sensitivity): 6 ng/L (ref <18)

## 2023-01-27 ENCOUNTER — Telehealth: Payer: Self-pay | Admitting: Psychiatry

## 2023-01-27 NOTE — Telephone Encounter (Signed)
Reviewed notes from ED and PCP. Symptoms are not consistent with serotonin syndrome (had muscle weakness and not muscle rigidity, no diarrhea or n/v, no fever, vital signs stable, reflexes normal, etc). ED notes do not mention serotonin syndrome. Leafy Kindle is not associated with serotonin syndrome and Trazodone is the only serotonergic medication she is currently taking. Antipsychotics can also cause Neuroleptic Malignant syndrome (NMS), however her symptoms are not consistent with NMS. Antipsychotics can cause some body temperature dysregulation and it is possible that this, along with recent extreme heat, could have contributed to her symptoms. Recommend that she adequately hydrate and take breaks from the heat. Recommend she contact office if she experiences any recurrence of symptoms that she had when she went to ED.

## 2023-01-27 NOTE — Telephone Encounter (Signed)
Patient notified

## 2023-01-27 NOTE — Telephone Encounter (Signed)
Please see message from patient.  She has not stopped the Vraylar because she has talked to several people who said it worked well for them and she is excited about it. She said her doctor told her that serotonin syndrome was something that may happen after initial introduction of the medication, but was not an indication to stop it. She has only taken it for 4 days, and one of those days has been since the ER visit.  She is not reporting any issues today.

## 2023-01-27 NOTE — Telephone Encounter (Signed)
Pt called at 1:53p stating that she started taking the Vraylar last Thursday and Sat ended up in the emergency room.  She thought she was having a stroke but they did a CT and MRI and doc's don't think it was cardiac or neurologic.  She went to her regular doc and they thought it might be serotonin syndrome.  She wanted to speak directly to Shanda Bumps to tell her what happened.  I told her I have to send the message to clinical and that someone has to talk to her first.  She was upset that she couldn't talk directly to Shanda Bumps, so after you talk to her, pls see if Shanda Bumps will call her back after seeing her pts today.  No upcoming appts scheduled

## 2023-01-28 ENCOUNTER — Telehealth: Payer: Self-pay

## 2023-01-28 NOTE — Telephone Encounter (Signed)
Transition Care Management Unsuccessful Follow-up Telephone Call  Date of discharge and from where:  [facility] 7/13  Attempts:  1st Attempt  Reason for unsuccessful TCM follow-up call:  No answer/busy   Lenard Forth [facility] Guide, [facility] [phone] [address] [place], [place], [place] [address] Phone: [phone] Email: [email or web address]

## 2023-01-29 ENCOUNTER — Telehealth: Payer: Self-pay

## 2023-01-29 NOTE — Telephone Encounter (Signed)
Transition Care Management Unsuccessful Follow-up Telephone Call  Date of discharge and from where:  [facility] 7/14  Attempts:  2nd Attempt  Reason for unsuccessful TCM follow-up call:  No answer/busy   Gina Wallace [facility] Guide, [facility] [phone] [address] [place], [place], [place] [address] Phone: [phone] Email: [email or web address]

## 2023-02-08 ENCOUNTER — Other Ambulatory Visit: Payer: Self-pay | Admitting: Student in an Organized Health Care Education/Training Program

## 2023-02-08 DIAGNOSIS — R4189 Other symptoms and signs involving cognitive functions and awareness: Secondary | ICD-10-CM | POA: Diagnosis not present

## 2023-02-08 DIAGNOSIS — R110 Nausea: Secondary | ICD-10-CM | POA: Diagnosis not present

## 2023-02-08 DIAGNOSIS — M797 Fibromyalgia: Secondary | ICD-10-CM

## 2023-02-08 DIAGNOSIS — R42 Dizziness and giddiness: Secondary | ICD-10-CM | POA: Diagnosis not present

## 2023-02-08 DIAGNOSIS — Z8673 Personal history of transient ischemic attack (TIA), and cerebral infarction without residual deficits: Secondary | ICD-10-CM | POA: Diagnosis not present

## 2023-02-08 DIAGNOSIS — G894 Chronic pain syndrome: Secondary | ICD-10-CM

## 2023-02-16 ENCOUNTER — Encounter: Payer: Self-pay | Admitting: Student in an Organized Health Care Education/Training Program

## 2023-02-16 ENCOUNTER — Ambulatory Visit
Payer: Medicare HMO | Attending: Student in an Organized Health Care Education/Training Program | Admitting: Student in an Organized Health Care Education/Training Program

## 2023-02-16 VITALS — BP 133/69 | HR 85 | Temp 98.1°F | Resp 16 | Ht 67.0 in | Wt 229.0 lb

## 2023-02-16 DIAGNOSIS — M7918 Myalgia, other site: Secondary | ICD-10-CM | POA: Insufficient documentation

## 2023-02-16 DIAGNOSIS — G894 Chronic pain syndrome: Secondary | ICD-10-CM | POA: Insufficient documentation

## 2023-02-16 DIAGNOSIS — M797 Fibromyalgia: Secondary | ICD-10-CM | POA: Diagnosis not present

## 2023-02-16 DIAGNOSIS — M542 Cervicalgia: Secondary | ICD-10-CM | POA: Diagnosis not present

## 2023-02-16 MED ORDER — TIZANIDINE HCL 4 MG PO TABS: 4.0000 mg | ORAL_TABLET | Freq: Every evening | ORAL | 2 refills | Status: DC | PRN

## 2023-02-16 MED ORDER — OXYCODONE-ACETAMINOPHEN 10-325 MG PO TABS: 1.0000 | ORAL_TABLET | Freq: Three times a day (TID) | ORAL | 0 refills | Status: AC | PRN

## 2023-02-16 MED ORDER — OXYCODONE-ACETAMINOPHEN 10-325 MG PO TABS: 1.0000 | ORAL_TABLET | Freq: Three times a day (TID) | ORAL | 0 refills | Status: DC | PRN
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
## 2023-02-16 NOTE — Progress Notes (Signed)
Nursing Pain Medication Assessment:  Safety precautions to be maintained throughout the outpatient stay will include: orient to surroundings, keep bed in low position, maintain call bell within reach at all times, provide assistance with transfer out of bed and ambulation.  Medication Inspection Compliance: Gina Wallace did not comply with our request to bring her pills to be counted. She was reminded that bringing the medication bottles, even when empty, is a requirement.  Medication: None brought in. Pill/Patch Count: None available to be counted. Bottle Appearance: No container available. Did not bring bottle(s) to appointment. Filled Date: N/A Last Medication intake:  Ran out of medicine more than 48 hours ago

## 2023-02-23 DIAGNOSIS — H353134 Nonexudative age-related macular degeneration, bilateral, advanced atrophic with subfoveal involvement: Secondary | ICD-10-CM | POA: Diagnosis not present

## 2023-02-25 ENCOUNTER — Telehealth: Payer: Self-pay | Admitting: Psychiatry

## 2023-02-25 NOTE — Telephone Encounter (Signed)
Gina Wallace lost her application for pt. Assistance for [employer].  She will pick up a new one at her appt. 8/19. She will also need samples but wants to wait until she talks with you incase you decide to increase her dose.  Please advise what samples she should get and we will also give her a new applicaton.

## 2023-02-26 NOTE — Telephone Encounter (Signed)
Will do!

## 2023-03-01 ENCOUNTER — Telehealth: Payer: Medicare HMO | Admitting: Psychiatry

## 2023-03-09 ENCOUNTER — Ambulatory Visit (INDEPENDENT_AMBULATORY_CARE_PROVIDER_SITE_OTHER): Payer: Medicare HMO | Admitting: Psychiatry

## 2023-03-09 ENCOUNTER — Ambulatory Visit: Payer: Medicare HMO | Admitting: Psychiatry

## 2023-03-09 ENCOUNTER — Encounter: Payer: Self-pay | Admitting: Psychiatry

## 2023-03-09 VITALS — BP 123/82 | HR 76

## 2023-03-09 DIAGNOSIS — F901 Attention-deficit hyperactivity disorder, predominantly hyperactive type: Secondary | ICD-10-CM

## 2023-03-09 DIAGNOSIS — F3130 Bipolar disorder, current episode depressed, mild or moderate severity, unspecified: Secondary | ICD-10-CM

## 2023-03-09 DIAGNOSIS — G4700 Insomnia, unspecified: Secondary | ICD-10-CM | POA: Diagnosis not present

## 2023-03-09 MED ORDER — BELSOMRA 15 MG PO TABS
15.0000 mg | ORAL_TABLET | Freq: Every day | ORAL | 0 refills | Status: DC
Start: 2023-03-09 — End: 2023-04-04

## 2023-03-09 MED ORDER — AMPHETAMINE-DEXTROAMPHETAMINE 20 MG PO TABS
20.0000 mg | ORAL_TABLET | Freq: Three times a day (TID) | ORAL | 0 refills | Status: DC
Start: 2023-05-04 — End: 2023-09-17

## 2023-03-09 MED ORDER — AMPHETAMINE-DEXTROAMPHETAMINE 20 MG PO TABS
20.0000 mg | ORAL_TABLET | Freq: Three times a day (TID) | ORAL | 0 refills | Status: DC
Start: 2023-04-06 — End: 2023-06-16

## 2023-03-09 MED ORDER — BELSOMRA 20 MG PO TABS
20.0000 mg | ORAL_TABLET | Freq: Every day | ORAL | 0 refills | Status: AC
Start: 2023-03-09 — End: 2023-03-19
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
## 2023-03-09 NOTE — Progress Notes (Unsigned)
Gina Wallace [id number] January 04, 1956 67 y.o.  Virtual Visit via Telephone Note  Attempted to reach pt by phone x 3. No answer and calls went directly to voicemail.

## 2023-03-23 DIAGNOSIS — H353134 Nonexudative age-related macular degeneration, bilateral, advanced atrophic with subfoveal involvement: Secondary | ICD-10-CM | POA: Diagnosis not present

## 2023-03-25 ENCOUNTER — Telehealth: Payer: Self-pay | Admitting: Psychiatry

## 2023-03-25 NOTE — Telephone Encounter (Signed)
Noted, thank you

## 2023-03-25 NOTE — Telephone Encounter (Signed)
Gina Wallace has been approved for patient Assistance for [employer] through 07/12/2024.  Her first delivery should be within the next 7-10 days.  I have notified the pt.

## 2023-03-31 DIAGNOSIS — M5136 Other intervertebral disc degeneration, lumbar region: Secondary | ICD-10-CM | POA: Diagnosis not present

## 2023-03-31 DIAGNOSIS — M545 Low back pain, unspecified: Secondary | ICD-10-CM | POA: Diagnosis not present

## 2023-03-31 DIAGNOSIS — G8929 Other chronic pain: Secondary | ICD-10-CM | POA: Diagnosis not present

## 2023-03-31 DIAGNOSIS — Z2821 Immunization not carried out because of patient refusal: Secondary | ICD-10-CM | POA: Diagnosis not present

## 2023-03-31 DIAGNOSIS — M4316 Spondylolisthesis, lumbar region: Secondary | ICD-10-CM | POA: Diagnosis not present

## 2023-03-31 DIAGNOSIS — R350 Frequency of micturition: Secondary | ICD-10-CM | POA: Diagnosis not present

## 2023-04-02 ENCOUNTER — Telehealth: Payer: Self-pay | Admitting: Psychiatry

## 2023-04-02 NOTE — Telephone Encounter (Signed)
Next visit is 06/02/23. Gina Wallace has completed the samples of Belsomra 20 mg and has had no problems with taking it. Please called a prescription in at [facility].  [facility] [address] - [place], [place] - [address]   Phone: [phone]  Fax: [phone]

## 2023-04-04 ENCOUNTER — Other Ambulatory Visit: Payer: Self-pay

## 2023-04-04 DIAGNOSIS — G4700 Insomnia, unspecified: Secondary | ICD-10-CM

## 2023-04-04 NOTE — Telephone Encounter (Signed)
Pended.

## 2023-04-05 MED ORDER — BELSOMRA 20 MG PO TABS: 1.0000 | ORAL_TABLET | Freq: Every day | ORAL | 0 refills | Status: DC

## 2023-04-06 DIAGNOSIS — D485 Neoplasm of uncertain behavior of skin: Secondary | ICD-10-CM | POA: Diagnosis not present

## 2023-04-06 DIAGNOSIS — D225 Melanocytic nevi of trunk: Secondary | ICD-10-CM | POA: Diagnosis not present

## 2023-04-06 DIAGNOSIS — D2262 Melanocytic nevi of left upper limb, including shoulder: Secondary | ICD-10-CM | POA: Diagnosis not present

## 2023-04-06 DIAGNOSIS — L905 Scar conditions and fibrosis of skin: Secondary | ICD-10-CM | POA: Diagnosis not present

## 2023-04-06 DIAGNOSIS — D2272 Melanocytic nevi of left lower limb, including hip: Secondary | ICD-10-CM | POA: Diagnosis not present

## 2023-04-06 DIAGNOSIS — D2261 Melanocytic nevi of right upper limb, including shoulder: Secondary | ICD-10-CM | POA: Diagnosis not present

## 2023-04-06 DIAGNOSIS — D2271 Melanocytic nevi of right lower limb, including hip: Secondary | ICD-10-CM | POA: Diagnosis not present

## 2023-04-06 DIAGNOSIS — L821 Other seborrheic keratosis: Secondary | ICD-10-CM | POA: Diagnosis not present

## 2023-04-13 ENCOUNTER — Telehealth: Payer: Self-pay | Admitting: Student in an Organized Health Care Education/Training Program

## 2023-04-13 NOTE — Telephone Encounter (Signed)
PT will be leaving to go out of town on Thursday, PT wants to know if she can be allowed to pick up medication on Thursday. PT stated that she will not have enough medication to last while she is out of town that's why she wants to be able to pick up prescription on Thursday. Please give patient a call. TY

## 2023-04-14 NOTE — Telephone Encounter (Signed)
Spoke with patient's pharmacy at [facility] and informed Public pharmacy tech that Dr. Cherylann Ratel gave the okay to pick up Oxycodone prescription. Tech states he will let the pharmacist know and they will gave patient a call once the prescription is ready.   Called patient and made her know of this update and she verbalized understanding.   Earlyne Iba, RN

## 2023-04-29 ENCOUNTER — Encounter: Payer: Medicare HMO | Admitting: Student in an Organized Health Care Education/Training Program

## 2023-05-05 ENCOUNTER — Encounter: Payer: Self-pay | Admitting: Student in an Organized Health Care Education/Training Program

## 2023-05-05 ENCOUNTER — Ambulatory Visit
Payer: Medicare HMO | Attending: Student in an Organized Health Care Education/Training Program | Admitting: Student in an Organized Health Care Education/Training Program

## 2023-05-05 VITALS — BP 116/63 | HR 70 | Temp 97.1°F | Resp 16 | Ht 68.5 in | Wt 214.0 lb

## 2023-05-05 DIAGNOSIS — M542 Cervicalgia: Secondary | ICD-10-CM | POA: Diagnosis not present

## 2023-05-05 DIAGNOSIS — M7918 Myalgia, other site: Secondary | ICD-10-CM | POA: Diagnosis not present

## 2023-05-05 DIAGNOSIS — M797 Fibromyalgia: Secondary | ICD-10-CM | POA: Insufficient documentation

## 2023-05-05 DIAGNOSIS — G894 Chronic pain syndrome: Secondary | ICD-10-CM | POA: Insufficient documentation

## 2023-05-05 MED ORDER — OXYCODONE-ACETAMINOPHEN 10-325 MG PO TABS: 1.0000 | ORAL_TABLET | Freq: Three times a day (TID) | ORAL | 0 refills | Status: DC | PRN

## 2023-05-05 MED ORDER — TIZANIDINE HCL 4 MG PO TABS: 4.0000 mg | ORAL_TABLET | Freq: Every evening | ORAL | 2 refills | Status: AC | PRN

## 2023-05-05 MED ORDER — OXYCODONE-ACETAMINOPHEN 10-325 MG PO TABS: 1.0000 | ORAL_TABLET | Freq: Three times a day (TID) | ORAL | 0 refills | Status: AC | PRN
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
## 2023-05-05 NOTE — Progress Notes (Signed)
Nursing Pain Medication Assessment:  Safety precautions to be maintained throughout the outpatient stay will include: orient to surroundings, keep bed in low position, maintain call bell within reach at all times, provide assistance with transfer out of bed and ambulation.  Medication Inspection Compliance: Pill count conducted under aseptic conditions, in front of the patient. Neither the pills nor the bottle was removed from the patient's sight at any time. Once count was completed pills were immediately returned to the patient in their original bottle.  Medication: Oxycodone/APAP Pill/Patch Count:  31 of 90 pills remain Pill/Patch Appearance: Markings consistent with prescribed medication Bottle Appearance: Standard pharmacy container. Clearly labeled. Filled Date: 10 / 03 / 2024 Last Medication intake:  Yesterday

## 2023-05-08 LAB — TOXASSURE SELECT 13 (MW), URINE

## 2023-05-13 ENCOUNTER — Encounter: Payer: Medicare HMO | Admitting: Student in an Organized Health Care Education/Training Program

## 2023-05-18 ENCOUNTER — Telehealth: Payer: Self-pay | Admitting: *Deleted

## 2023-05-18 ENCOUNTER — Encounter: Payer: Medicare HMO | Admitting: Student in an Organized Health Care Education/Training Program

## 2023-05-18 NOTE — Telephone Encounter (Signed)
Called patient to let her know about UDS and THC being present in urine. Told her that BL would no longer prescribe for her.  Weaning process discussed.  Patient expresses frustration that BL did not tell her at the first UDS showing THC that if she did it again he would drop her.  She states that this was a gummy that she took from her sister.  She pleads to ask if BL would change his mind, that she has been a patient for a long time and should take her word that she will never touch anything illicit again.    I did tell her that I would convey this information to BL.

## 2023-05-18 NOTE — Telephone Encounter (Signed)
Attempted to reach patient re; UDS toxicology.  Positive for THC, note from Dr Cherylann Ratel to please share these results with her and let her know that he will no longer provide opioid therpy.  If she needs to wean down she can decrease by 1 tablet every 7 days until she is done with medication.  Voicemail left asking patient to please return call.  No information given on answering machine, only to please call office.   Message from BL: Please inform patient that based upon her recent urine toxicology screen, I was not longer be continuing opioid therapy for her. Her current prescription will last until January at which point she'll need to to have weaned herself off or find a new pain management provider. This is the second time she's been positive for THC. Please call and inform patient and make sure it's documented.   If she's interested in wean, have her decrease by 1 tablet every 7 days until she is off.

## 2023-05-25 DIAGNOSIS — R4189 Other symptoms and signs involving cognitive functions and awareness: Secondary | ICD-10-CM | POA: Diagnosis not present

## 2023-05-25 DIAGNOSIS — Z8673 Personal history of transient ischemic attack (TIA), and cerebral infarction without residual deficits: Secondary | ICD-10-CM | POA: Diagnosis not present

## 2023-05-26 ENCOUNTER — Encounter: Payer: Self-pay | Admitting: Psychiatry

## 2023-06-16 ENCOUNTER — Telehealth: Payer: Self-pay | Admitting: Psychiatry

## 2023-06-16 ENCOUNTER — Other Ambulatory Visit: Payer: Self-pay

## 2023-06-16 DIAGNOSIS — F901 Attention-deficit hyperactivity disorder, predominantly hyperactive type: Secondary | ICD-10-CM

## 2023-06-16 MED ORDER — AMPHETAMINE-DEXTROAMPHETAMINE 20 MG PO TABS: 20.0000 mg | ORAL_TABLET | Freq: Three times a day (TID) | ORAL | 0 refills | Status: DC

## 2023-06-16 MED ORDER — AMPHETAMINE-DEXTROAMPHETAMINE 20 MG PO TABS
20.0000 mg | ORAL_TABLET | Freq: Three times a day (TID) | ORAL | 0 refills | Status: AC
Start: 2023-06-16 — End: 2023-07-16

## 2023-06-16 NOTE — Telephone Encounter (Signed)
Pended Adderall 20 mg, #90, to [facility].

## 2023-06-16 NOTE — Telephone Encounter (Signed)
Gina Wallace called at 9:15 to request refill of her Adderall.  Appt 12/16.  Send to [facility] [address] - [place], [place] - [address] AT [address]

## 2023-06-17 ENCOUNTER — Encounter: Payer: Self-pay | Admitting: Student in an Organized Health Care Education/Training Program

## 2023-06-28 ENCOUNTER — Ambulatory Visit: Payer: Medicare HMO | Admitting: Psychiatry

## 2023-07-09 ENCOUNTER — Telehealth: Payer: Self-pay

## 2023-07-09 NOTE — Telephone Encounter (Signed)
Prior Authorization submitted and approved for Vraylar 1.5 mg through 07/12/2024, PA# [id number].

## 2023-08-05 ENCOUNTER — Other Ambulatory Visit: Payer: Self-pay

## 2023-08-05 ENCOUNTER — Encounter: Payer: Medicare HMO | Admitting: Student in an Organized Health Care Education/Training Program

## 2023-08-05 DIAGNOSIS — G4700 Insomnia, unspecified: Secondary | ICD-10-CM

## 2023-08-05 MED ORDER — TRAZODONE HCL 150 MG PO TABS: ORAL_TABLET | ORAL | 0 refills | Status: DC

## 2023-08-10 ENCOUNTER — Ambulatory Visit (INDEPENDENT_AMBULATORY_CARE_PROVIDER_SITE_OTHER): Payer: Medicare HMO | Admitting: Behavioral Health

## 2023-08-10 ENCOUNTER — Encounter: Payer: Self-pay | Admitting: Behavioral Health

## 2023-08-10 DIAGNOSIS — F901 Attention-deficit hyperactivity disorder, predominantly hyperactive type: Secondary | ICD-10-CM | POA: Diagnosis not present

## 2023-08-10 DIAGNOSIS — F3130 Bipolar disorder, current episode depressed, mild or moderate severity, unspecified: Secondary | ICD-10-CM

## 2023-08-10 DIAGNOSIS — G4700 Insomnia, unspecified: Secondary | ICD-10-CM | POA: Diagnosis not present

## 2023-08-10 MED ORDER — TRAZODONE HCL 150 MG PO TABS: ORAL_TABLET | ORAL | 1 refills | Status: DC
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
## 2023-08-12 DIAGNOSIS — H35372 Puckering of macula, left eye: Secondary | ICD-10-CM | POA: Diagnosis not present

## 2023-08-12 DIAGNOSIS — Z0100 Encounter for examination of eyes and vision without abnormal findings: Secondary | ICD-10-CM | POA: Diagnosis not present

## 2023-08-12 DIAGNOSIS — H43813 Vitreous degeneration, bilateral: Secondary | ICD-10-CM | POA: Diagnosis not present

## 2023-08-12 DIAGNOSIS — I10 Essential (primary) hypertension: Secondary | ICD-10-CM | POA: Diagnosis not present

## 2023-08-12 DIAGNOSIS — H16223 Keratoconjunctivitis sicca, not specified as Sjogren's, bilateral: Secondary | ICD-10-CM | POA: Diagnosis not present

## 2023-08-12 DIAGNOSIS — H02889 Meibomian gland dysfunction of unspecified eye, unspecified eyelid: Secondary | ICD-10-CM | POA: Diagnosis not present

## 2023-08-12 DIAGNOSIS — H35033 Hypertensive retinopathy, bilateral: Secondary | ICD-10-CM | POA: Diagnosis not present

## 2023-08-12 DIAGNOSIS — H353133 Nonexudative age-related macular degeneration, bilateral, advanced atrophic without subfoveal involvement: Secondary | ICD-10-CM | POA: Diagnosis not present

## 2023-08-12 DIAGNOSIS — L718 Other rosacea: Secondary | ICD-10-CM | POA: Diagnosis not present

## 2023-08-12 DIAGNOSIS — H26493 Other secondary cataract, bilateral: Secondary | ICD-10-CM | POA: Diagnosis not present

## 2023-08-23 DIAGNOSIS — M1711 Unilateral primary osteoarthritis, right knee: Secondary | ICD-10-CM | POA: Diagnosis not present

## 2023-09-08 DIAGNOSIS — M1711 Unilateral primary osteoarthritis, right knee: Secondary | ICD-10-CM | POA: Diagnosis not present

## 2023-09-17 ENCOUNTER — Telehealth: Payer: Self-pay | Admitting: Behavioral Health

## 2023-09-17 ENCOUNTER — Other Ambulatory Visit: Payer: Self-pay

## 2023-09-17 DIAGNOSIS — F901 Attention-deficit hyperactivity disorder, predominantly hyperactive type: Secondary | ICD-10-CM

## 2023-09-17 NOTE — Telephone Encounter (Signed)
 Pended Adderall 20 mg, #90, to [facility].

## 2023-09-17 NOTE — Telephone Encounter (Signed)
 Pt called and saw brian the end of january and he forgot to send in her adderall 20 mg. Pharmacy is [facility] on [address] in [place]. She only has two days left

## 2023-09-20 MED ORDER — AMPHETAMINE-DEXTROAMPHETAMINE 20 MG PO TABS: 20.0000 mg | ORAL_TABLET | Freq: Three times a day (TID) | ORAL | 0 refills | Status: AC

## 2023-09-20 MED ORDER — AMPHETAMINE-DEXTROAMPHETAMINE 20 MG PO TABS: 20.0000 mg | ORAL_TABLET | Freq: Three times a day (TID) | ORAL | 0 refills | Status: DC

## 2023-09-22 DIAGNOSIS — M1711 Unilateral primary osteoarthritis, right knee: Secondary | ICD-10-CM | POA: Diagnosis not present

## 2023-09-29 DIAGNOSIS — M1711 Unilateral primary osteoarthritis, right knee: Secondary | ICD-10-CM | POA: Diagnosis not present

## 2023-10-07 ENCOUNTER — Telehealth: Payer: Self-pay | Admitting: Internal Medicine

## 2023-10-07 NOTE — Telephone Encounter (Signed)
 Dr Clent Ridges is leaving the practice and your appointment on 01/19/24 needs to be rescheduled with another provider. Please call the office to reschedule your New Patient appointment to either Dr Charlann Lange, MD, Darleen Crocker or Kara Dies, NP.  E2C2 please schedule

## 2023-11-08 ENCOUNTER — Encounter: Payer: Self-pay | Admitting: Behavioral Health

## 2023-11-08 ENCOUNTER — Ambulatory Visit (INDEPENDENT_AMBULATORY_CARE_PROVIDER_SITE_OTHER): Payer: Medicare HMO | Admitting: Behavioral Health

## 2023-11-08 DIAGNOSIS — G4700 Insomnia, unspecified: Secondary | ICD-10-CM | POA: Diagnosis not present

## 2023-11-08 DIAGNOSIS — F901 Attention-deficit hyperactivity disorder, predominantly hyperactive type: Secondary | ICD-10-CM | POA: Diagnosis not present

## 2023-11-08 DIAGNOSIS — F3130 Bipolar disorder, current episode depressed, mild or moderate severity, unspecified: Secondary | ICD-10-CM | POA: Diagnosis not present

## 2023-11-08 MED ORDER — CARIPRAZINE HCL 3 MG PO CAPS: 3.0000 mg | ORAL_CAPSULE | Freq: Every day | ORAL | 1 refills | Status: DC

## 2023-11-08 MED ORDER — TRAZODONE HCL 150 MG PO TABS: ORAL_TABLET | ORAL | 1 refills | Status: DC

## 2023-11-08 MED ORDER — CARIPRAZINE HCL 1.5 MG PO CAPS: 1.5000 mg | ORAL_CAPSULE | Freq: Every day | ORAL | 1 refills | Status: DC
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
## 2023-11-17 ENCOUNTER — Other Ambulatory Visit: Payer: Self-pay | Admitting: Behavioral Health

## 2023-11-17 DIAGNOSIS — F901 Attention-deficit hyperactivity disorder, predominantly hyperactive type: Secondary | ICD-10-CM

## 2023-12-24 ENCOUNTER — Other Ambulatory Visit: Payer: Self-pay | Admitting: Internal Medicine

## 2023-12-24 DIAGNOSIS — Z1231 Encounter for screening mammogram for malignant neoplasm of breast: Secondary | ICD-10-CM

## 2023-12-27 DIAGNOSIS — R112 Nausea with vomiting, unspecified: Secondary | ICD-10-CM | POA: Diagnosis not present

## 2023-12-27 DIAGNOSIS — Z131 Encounter for screening for diabetes mellitus: Secondary | ICD-10-CM | POA: Diagnosis not present

## 2023-12-27 DIAGNOSIS — Z1329 Encounter for screening for other suspected endocrine disorder: Secondary | ICD-10-CM | POA: Diagnosis not present

## 2024-01-04 ENCOUNTER — Ambulatory Visit
Admission: RE | Admit: 2024-01-04 | Discharge: 2024-01-04 | Disposition: A | Discharge status: Home / Self Care | Source: Ambulatory Visit | Attending: Internal Medicine | Admitting: Internal Medicine

## 2024-01-04 DIAGNOSIS — Z1231 Encounter for screening mammogram for malignant neoplasm of breast: Secondary | ICD-10-CM | POA: Insufficient documentation

## 2024-01-15 ENCOUNTER — Other Ambulatory Visit: Payer: Self-pay | Admitting: Behavioral Health

## 2024-01-15 DIAGNOSIS — F901 Attention-deficit hyperactivity disorder, predominantly hyperactive type: Secondary | ICD-10-CM

## 2024-01-16 NOTE — Telephone Encounter (Signed)
Please call patient to schedule F/U, was due in June.

## 2024-01-17 NOTE — Telephone Encounter (Signed)
 Lvm for patient to call and schedule

## 2024-01-17 NOTE — Telephone Encounter (Signed)
 Pt called requesting Rx Adderall at [facility]   Apt [address]/28

## 2024-01-19 ENCOUNTER — Ambulatory Visit: Payer: Medicare HMO | Admitting: Family Medicine

## 2024-02-07 ENCOUNTER — Ambulatory Visit: Admitting: Behavioral Health

## 2024-02-07 ENCOUNTER — Encounter: Payer: Self-pay | Admitting: Behavioral Health

## 2024-02-07 DIAGNOSIS — F3130 Bipolar disorder, current episode depressed, mild or moderate severity, unspecified: Secondary | ICD-10-CM

## 2024-02-07 DIAGNOSIS — G4700 Insomnia, unspecified: Secondary | ICD-10-CM | POA: Diagnosis not present

## 2024-02-07 MED ORDER — CARIPRAZINE HCL 1.5 MG PO CAPS: 1.5000 mg | ORAL_CAPSULE | Freq: Every day | ORAL | 1 refills | Status: DC

## 2024-02-07 MED ORDER — CARIPRAZINE HCL 3 MG PO CAPS: 3.0000 mg | ORAL_CAPSULE | Freq: Every day | ORAL | 3 refills | Status: DC

## 2024-02-07 MED ORDER — TRAZODONE HCL 150 MG PO TABS: ORAL_TABLET | ORAL | 1 refills | Status: DC
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
## 2024-02-09 DIAGNOSIS — Z0000 Encounter for general adult medical examination without abnormal findings: Secondary | ICD-10-CM | POA: Diagnosis not present

## 2024-02-09 DIAGNOSIS — R7309 Other abnormal glucose: Secondary | ICD-10-CM | POA: Diagnosis not present

## 2024-02-09 DIAGNOSIS — I10 Essential (primary) hypertension: Secondary | ICD-10-CM | POA: Diagnosis not present

## 2024-02-09 DIAGNOSIS — Z1329 Encounter for screening for other suspected endocrine disorder: Secondary | ICD-10-CM | POA: Diagnosis not present

## 2024-02-16 ENCOUNTER — Ambulatory Visit
Admission: RE | Admit: 2024-02-16 | Discharge: 2024-02-16 | Disposition: A | Discharge status: Home / Self Care | Source: Ambulatory Visit | Attending: Internal Medicine | Admitting: Internal Medicine

## 2024-02-16 ENCOUNTER — Other Ambulatory Visit: Payer: Self-pay | Admitting: Internal Medicine

## 2024-02-16 DIAGNOSIS — Z1331 Encounter for screening for depression: Secondary | ICD-10-CM | POA: Diagnosis not present

## 2024-02-16 DIAGNOSIS — R600 Localized edema: Secondary | ICD-10-CM | POA: Diagnosis not present

## 2024-02-16 DIAGNOSIS — Z0000 Encounter for general adult medical examination without abnormal findings: Secondary | ICD-10-CM | POA: Diagnosis not present

## 2024-02-17 ENCOUNTER — Telehealth: Payer: Self-pay | Admitting: Behavioral Health

## 2024-02-17 ENCOUNTER — Other Ambulatory Visit: Payer: Self-pay

## 2024-02-17 DIAGNOSIS — F901 Attention-deficit hyperactivity disorder, predominantly hyperactive type: Secondary | ICD-10-CM

## 2024-02-17 MED ORDER — AMPHETAMINE-DEXTROAMPHETAMINE 20 MG PO TABS: 20.0000 mg | ORAL_TABLET | Freq: Three times a day (TID) | ORAL | 0 refills | Status: DC

## 2024-02-17 NOTE — Telephone Encounter (Signed)
 Pended.

## 2024-02-17 NOTE — Telephone Encounter (Signed)
 Pt called @ 11:35a upset because her Adderall has not been sent in.  She said Redell told her at her visit on 7/28, he would send in 3 scripts of Adderall for her.  He sent in Trazadone and Vraylar  but not the Adderall.  Now she has been out for the last two days.  Pls send to   [facility] [address] [facility] - [place], [place] - [address] AT [address] [place], [place] [address] Phone: [phone]  Fax: [phone]   Next appt 8/19

## 2024-02-23 DIAGNOSIS — I10 Essential (primary) hypertension: Secondary | ICD-10-CM | POA: Diagnosis not present

## 2024-02-23 DIAGNOSIS — M79672 Pain in left foot: Secondary | ICD-10-CM | POA: Diagnosis not present

## 2024-02-23 DIAGNOSIS — R7303 Prediabetes: Secondary | ICD-10-CM | POA: Diagnosis not present

## 2024-02-23 DIAGNOSIS — F319 Bipolar disorder, unspecified: Secondary | ICD-10-CM | POA: Diagnosis not present

## 2024-02-29 ENCOUNTER — Encounter: Payer: Self-pay | Admitting: Behavioral Health

## 2024-02-29 ENCOUNTER — Ambulatory Visit: Admitting: Behavioral Health

## 2024-02-29 DIAGNOSIS — F901 Attention-deficit hyperactivity disorder, predominantly hyperactive type: Secondary | ICD-10-CM

## 2024-02-29 DIAGNOSIS — F3130 Bipolar disorder, current episode depressed, mild or moderate severity, unspecified: Secondary | ICD-10-CM | POA: Diagnosis not present

## 2024-02-29 DIAGNOSIS — F3170 Bipolar disorder, currently in remission, most recent episode unspecified: Secondary | ICD-10-CM

## 2024-02-29 DIAGNOSIS — G4700 Insomnia, unspecified: Secondary | ICD-10-CM

## 2024-02-29 MED ORDER — AMPHETAMINE-DEXTROAMPHETAMINE 20 MG PO TABS: 20.0000 mg | ORAL_TABLET | Freq: Three times a day (TID) | ORAL | 0 refills | Status: AC

## 2024-02-29 MED ORDER — TRAZODONE HCL 150 MG PO TABS: ORAL_TABLET | ORAL | 1 refills | Status: DC

## 2024-02-29 MED ORDER — CARIPRAZINE HCL 3 MG PO CAPS: 3.0000 mg | ORAL_CAPSULE | Freq: Every day | ORAL | 3 refills | Status: AC
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
## 2024-03-30 DIAGNOSIS — G5791 Unspecified mononeuropathy of right lower limb: Secondary | ICD-10-CM | POA: Diagnosis not present

## 2024-03-30 DIAGNOSIS — Z9889 Other specified postprocedural states: Secondary | ICD-10-CM | POA: Diagnosis not present

## 2024-03-30 DIAGNOSIS — M79671 Pain in right foot: Secondary | ICD-10-CM | POA: Diagnosis not present

## 2024-03-30 DIAGNOSIS — M79672 Pain in left foot: Secondary | ICD-10-CM | POA: Diagnosis not present

## 2024-04-18 DIAGNOSIS — Z1211 Encounter for screening for malignant neoplasm of colon: Secondary | ICD-10-CM | POA: Diagnosis not present

## 2024-04-18 DIAGNOSIS — K219 Gastro-esophageal reflux disease without esophagitis: Secondary | ICD-10-CM | POA: Diagnosis not present

## 2024-04-18 DIAGNOSIS — K5909 Other constipation: Secondary | ICD-10-CM | POA: Diagnosis not present

## 2024-04-18 DIAGNOSIS — R112 Nausea with vomiting, unspecified: Secondary | ICD-10-CM | POA: Diagnosis not present

## 2024-04-19 ENCOUNTER — Telehealth: Payer: Self-pay | Admitting: Behavioral Health

## 2024-04-19 NOTE — Telephone Encounter (Signed)
 Disregard encounter

## 2024-04-21 ENCOUNTER — Other Ambulatory Visit: Payer: Self-pay | Admitting: Gastroenterology

## 2024-04-21 DIAGNOSIS — K219 Gastro-esophageal reflux disease without esophagitis: Secondary | ICD-10-CM

## 2024-04-21 DIAGNOSIS — K5909 Other constipation: Secondary | ICD-10-CM

## 2024-04-21 DIAGNOSIS — R112 Nausea with vomiting, unspecified: Secondary | ICD-10-CM

## 2024-04-30 ENCOUNTER — Ambulatory Visit

## 2024-04-30 ENCOUNTER — Ambulatory Visit: Admission: RE | Admit: 2024-04-30 | Source: Ambulatory Visit

## 2024-05-06 ENCOUNTER — Ambulatory Visit
Admission: RE | Admit: 2024-05-06 | Discharge: 2024-05-06 | Disposition: A | Source: Ambulatory Visit | Attending: Gastroenterology

## 2024-05-06 ENCOUNTER — Ambulatory Visit
Admission: RE | Admit: 2024-05-06 | Discharge: 2024-05-06 | Disposition: A | Discharge status: Home / Self Care | Source: Ambulatory Visit | Attending: Gastroenterology | Admitting: Gastroenterology

## 2024-05-06 DIAGNOSIS — K219 Gastro-esophageal reflux disease without esophagitis: Secondary | ICD-10-CM

## 2024-05-06 DIAGNOSIS — K8020 Calculus of gallbladder without cholecystitis without obstruction: Secondary | ICD-10-CM | POA: Diagnosis not present

## 2024-05-06 DIAGNOSIS — Z90710 Acquired absence of both cervix and uterus: Secondary | ICD-10-CM | POA: Diagnosis not present

## 2024-05-06 DIAGNOSIS — K5909 Other constipation: Secondary | ICD-10-CM | POA: Diagnosis not present

## 2024-05-06 DIAGNOSIS — R112 Nausea with vomiting, unspecified: Secondary | ICD-10-CM | POA: Insufficient documentation

## 2024-05-06 MED ORDER — GADOBUTROL 1 MMOL/ML IV SOLN
9.0000 mL | Freq: Once | INTRAVENOUS | Status: AC | PRN
  Administered 2024-05-06: 9 mL via INTRAVENOUS

## 2024-05-12 ENCOUNTER — Encounter: Payer: Self-pay | Admitting: Gastroenterology

## 2024-05-15 ENCOUNTER — Ambulatory Visit
Admission: RE | Admit: 2024-05-15 | Discharge: 2024-05-15 | Disposition: A | Discharge status: Home / Self Care | Attending: Gastroenterology | Admitting: Gastroenterology

## 2024-05-15 ENCOUNTER — Encounter
Admission: RE | Disposition: A | Discharge status: Home / Self Care | Payer: Self-pay | Source: Home / Self Care | Attending: Gastroenterology

## 2024-05-15 ENCOUNTER — Encounter: Payer: Self-pay | Admitting: Gastroenterology

## 2024-05-15 ENCOUNTER — Ambulatory Visit: Admitting: Certified Registered"

## 2024-05-15 ENCOUNTER — Other Ambulatory Visit: Payer: Self-pay

## 2024-05-15 DIAGNOSIS — Q438 Other specified congenital malformations of intestine: Secondary | ICD-10-CM | POA: Diagnosis not present

## 2024-05-15 DIAGNOSIS — Z87891 Personal history of nicotine dependence: Secondary | ICD-10-CM | POA: Diagnosis not present

## 2024-05-15 DIAGNOSIS — I10 Essential (primary) hypertension: Secondary | ICD-10-CM | POA: Insufficient documentation

## 2024-05-15 DIAGNOSIS — K5730 Diverticulosis of large intestine without perforation or abscess without bleeding: Secondary | ICD-10-CM | POA: Diagnosis not present

## 2024-05-15 DIAGNOSIS — Z1211 Encounter for screening for malignant neoplasm of colon: Secondary | ICD-10-CM | POA: Insufficient documentation

## 2024-05-15 DIAGNOSIS — K2100 Gastro-esophageal reflux disease with esophagitis, without bleeding: Secondary | ICD-10-CM | POA: Diagnosis not present

## 2024-05-15 DIAGNOSIS — Z6836 Body mass index (BMI) 36.0-36.9, adult: Secondary | ICD-10-CM | POA: Diagnosis not present

## 2024-05-15 DIAGNOSIS — K3189 Other diseases of stomach and duodenum: Secondary | ICD-10-CM | POA: Diagnosis not present

## 2024-05-15 DIAGNOSIS — K449 Diaphragmatic hernia without obstruction or gangrene: Secondary | ICD-10-CM | POA: Diagnosis not present

## 2024-05-15 DIAGNOSIS — R112 Nausea with vomiting, unspecified: Secondary | ICD-10-CM | POA: Diagnosis not present

## 2024-05-15 DIAGNOSIS — K5790 Diverticulosis of intestine, part unspecified, without perforation or abscess without bleeding: Secondary | ICD-10-CM | POA: Diagnosis not present

## 2024-05-15 DIAGNOSIS — D122 Benign neoplasm of ascending colon: Secondary | ICD-10-CM | POA: Diagnosis not present

## 2024-05-15 DIAGNOSIS — E669 Obesity, unspecified: Secondary | ICD-10-CM | POA: Diagnosis not present

## 2024-05-15 DIAGNOSIS — K2970 Gastritis, unspecified, without bleeding: Secondary | ICD-10-CM | POA: Insufficient documentation

## 2024-05-15 DIAGNOSIS — F319 Bipolar disorder, unspecified: Secondary | ICD-10-CM | POA: Diagnosis not present

## 2024-05-15 DIAGNOSIS — K319 Disease of stomach and duodenum, unspecified: Secondary | ICD-10-CM | POA: Diagnosis not present

## 2024-05-15 DIAGNOSIS — K635 Polyp of colon: Secondary | ICD-10-CM | POA: Diagnosis not present

## 2024-05-15 HISTORY — PX: COLONOSCOPY: SHX5424

## 2024-05-15 HISTORY — PX: ESOPHAGOGASTRODUODENOSCOPY: SHX5428

## 2024-05-15 HISTORY — PX: POLYPECTOMY: SHX149

## 2024-05-15 HISTORY — DX: Transient cerebral ischemic attack, unspecified: G45.9

## 2024-05-15 SURGERY — COLONOSCOPY
Anesthesia: General

## 2024-05-15 MED ORDER — SODIUM CHLORIDE 0.9 % IV SOLN: INTRAVENOUS | Status: DC

## 2024-05-15 MED ORDER — PROPOFOL 500 MG/50ML IV EMUL
INTRAVENOUS | Status: DC | PRN
  Administered 2024-05-15: 200 ug/kg/min via INTRAVENOUS
  Administered 2024-05-15 (×2): 50 mg via INTRAVENOUS

## 2024-05-15 MED ORDER — LIDOCAINE HCL (CARDIAC) PF 100 MG/5ML IV SOSY
PREFILLED_SYRINGE | INTRAVENOUS | Status: DC | PRN
  Administered 2024-05-15 (×2): 100 mg via INTRAVENOUS

## 2024-05-15 NOTE — Anesthesia Preprocedure Evaluation (Signed)
 Anesthesia Evaluation  Patient identified by MRN, date of birth, ID band Patient awake    Reviewed: Allergy & Precautions, NPO status , Patient's Chart, lab work & pertinent test results  History of Anesthesia Complications Negative for: history of anesthetic complications  Airway Mallampati: III  TM Distance: >3 FB Neck ROM: full    Dental  (+) Upper Dentures   Pulmonary neg pulmonary ROS, former smoker   Pulmonary exam normal        Cardiovascular hypertension, negative cardio ROS Normal cardiovascular exam     Neuro/Psych  PSYCHIATRIC DISORDERS Anxiety Depression Bipolar Disorder   negative neurological ROS     GI/Hepatic negative GI ROS, Neg liver ROS,,,  Endo/Other  negative endocrine ROS    Renal/GU negative Renal ROS  negative genitourinary   Musculoskeletal  (+)  Fibromyalgia -  Abdominal   Peds  Hematology negative hematology ROS (+)   Anesthesia Other Findings Past Medical History: No date: ADHD (attention deficit hyperactivity disorder) No date: Anxiety No date: Bipolar disorder (HCC) No date: Carpal tunnel syndrome No date: Complication of anesthesia     Comment:  pt requires larger doses of meds for effect No date: Depression No date: Fibromyalgia No date: Hypertension No date: Macular degeneration No date: Vitamin D deficiency No date: Wears dentures     Comment:  full upper and lower  Past Surgical History: No date: ABDOMINAL HYSTERECTOMY No date: ABLATION No date: ANTERIOR CRUCIATE LIGAMENT REPAIR; Right No date: BREAST BIOPSY; Bilateral     Comment:  All negative No date: CESAREAN SECTION No date: EYE SURGERY 02/02/2018: HALLUX VALGUS LAPIDUS; Left     Comment:  Procedure: HALLUX VALGUS LAPIDUS;  Surgeon: Ashley Soulier, DPM;  Location: [facility];  Service:               Podiatry;  Laterality: Left;  general w popliteal               block lapiplasty  set 02/02/2018: HAMMER TOE SURGERY; Left     Comment:  Procedure: HAMMER TOE CORRECTION - T1 & T2;  Surgeon:               Ashley Soulier, DPM;  Location: [facility];                Service: Podiatry;  Laterality: Left; No date: THYROIDECTOMY; Right     Comment:  left lobe remains No date: TONSILLECTOMY AND ADENOIDECTOMY 02/02/2018: WEIL OSTEOTOMY; Left     Comment:  Procedure: WEIL OSTEOTOMY X 2, REVERSE [place], AND               KATRINA;  Surgeon: Ashley Soulier, DPM;  Location: [facility]               [facility];  Service: Podiatry;  Laterality: Left;     Reproductive/Obstetrics negative OB ROS                              Anesthesia Physical Anesthesia Plan  ASA: 3  Anesthesia Plan: General   Post-op Pain Management: Minimal or no pain anticipated   Induction: Intravenous  PONV Risk Score and Plan: 2 and Propofol  infusion and TIVA  Airway Management Planned: Natural Airway and Nasal Cannula  Additional Equipment:   Intra-op Plan:   Post-operative Plan:   Informed Consent: I have reviewed the patients  History and Physical, chart, labs and discussed the procedure including the risks, benefits and alternatives for the proposed anesthesia with the patient or authorized representative who has indicated his/her understanding and acceptance.     Dental Advisory Given  Plan Discussed with: Anesthesiologist, CRNA and Surgeon  Anesthesia Plan Comments: (Patient consented for risks of anesthesia including but not limited to:  - adverse reactions to medications - risk of airway placement if required - damage to eyes, teeth, lips or other oral mucosa - nerve damage due to positioning  - sore throat or hoarseness - Damage to heart, brain, nerves, lungs, other parts of body or loss of life  Patient voiced understanding and assent.)        Anesthesia Quick Evaluation

## 2024-05-15 NOTE — H&P (Signed)
 Gina Wallace , MD [address], [address], [place], [place], [address] Phone: [phone] Fax: [phone]  Primary Care Physician:  Lenon Layman ORN, MD   Pre-Procedure History & Physical: HPI:  Gina Wallace is a 68 y.o. female is here for an endoscopy and colonoscopy    Past Medical History:  Diagnosis Date   ADHD (attention deficit hyperactivity disorder)    Anxiety    Bipolar disorder (HCC)    Carpal tunnel syndrome    Complication of anesthesia    pt requires larger doses of meds for effect   Depression    Fibromyalgia    Hypertension    Macular degeneration    TIA (transient ischemic attack)    Vitamin D deficiency    Wears dentures    full upper and lower    Past Surgical History:  Procedure Laterality Date   ABDOMINAL HYSTERECTOMY     ABLATION     ANTERIOR CRUCIATE LIGAMENT REPAIR Right    BREAST BIOPSY Bilateral    All negative   CESAREAN SECTION     EYE SURGERY     HALLUX VALGUS LAPIDUS Left 02/02/2018   Procedure: HALLUX VALGUS LAPIDUS;  Surgeon: Ashley Soulier, DPM;  Location: [facility];  Service: Podiatry;  Laterality: Left;  general w popliteal block lapiplasty set   HAMMER TOE SURGERY Left 02/02/2018   Procedure: HAMMER TOE CORRECTION - T1 & T2;  Surgeon: Ashley Soulier, DPM;  Location: [facility];  Service: Podiatry;  Laterality: Left;   THYROIDECTOMY Right    left lobe remains   TONSILLECTOMY AND ADENOIDECTOMY     WEIL OSTEOTOMY Left 02/02/2018   Procedure: WEIL OSTEOTOMY X 2, REVERSE AUSTIN, AND KATRINA;  Surgeon: Ashley Soulier, DPM;  Location: [facility];  Service: Podiatry;  Laterality: Left;    Prior to Admission medications   Medication Sig Start Date End Date Taking? Authorizing Provider  amLODipine (NORVASC) 10 MG tablet Take 10 mg by mouth daily.   Yes [provider]  amphetamine -dextroamphetamine  (ADDERALL) 20 MG tablet Take 1 tablet (20 mg total) by mouth 3 (three) times daily. 05/17/24  06/16/24 Yes Teresa Redell LABOR, NP  aspirin 81 MG chewable tablet Chew 81 mg by mouth daily.   Yes [provider]  B Complex-C-Iron (B COMPLEX-IRON PO) Take 1 capsule by mouth daily.   Yes [provider]  Cholecalciferol (VITAMIN D) 50 MCG (2000 UT) tablet Take 2,000 Units by mouth daily.   Yes [provider]  losartan (COZAAR) 100 MG tablet Take 100 mg by mouth daily.   Yes [provider]  magnesium  30 MG tablet Take 30 mg by mouth 2 (two) times daily.   Yes [provider]  Omega-3 Fatty Acids (FISH OIL BURP-LESS PO) Take by mouth.   Yes [provider]  tiZANidine  (ZANAFLEX ) 4 MG tablet Take 1 tablet (4 mg total) by mouth at bedtime as needed for muscle spasms. 05/05/23  Yes Marcelino Nurse, MD  traZODone  (DESYREL ) 150 MG tablet Take 2-3 tablets at bedtime for insomnia 02/29/24  Yes Teresa Redell A, NP  amphetamine -dextroamphetamine  (ADDERALL) 20 MG tablet Take 1 tablet (20 mg total) by mouth 3 (three) times daily. 07/14/23   Franchot Harlene SQUIBB, PMHNP  amphetamine -dextroamphetamine  (ADDERALL) 20 MG tablet Take 1 tablet (20 mg total) by mouth 3 (three) times daily. 06/16/23 07/16/23  Franchot Harlene SQUIBB, PMHNP  amphetamine -dextroamphetamine  (ADDERALL) 20 MG tablet Take 1 tablet (20 mg total) by mouth 3 (three) times daily.  09/20/23   Teresa Redell LABOR, NP  amphetamine -dextroamphetamine  (ADDERALL) 20 MG tablet TAKE ONE TABLET BY MOUTH THREE TIMES A DAY 11/18/23   Teresa Redell LABOR, NP  amphetamine -dextroamphetamine  (ADDERALL) 20 MG tablet TAKE ONE TABLET BY MOUTH THREE TIMES A DAY 01/18/24   White, Redell LABOR, NP  amphetamine -dextroamphetamine  (ADDERALL) 20 MG tablet Take 1 tablet (20 mg total) by mouth 3 (three) times daily. 03/18/24 04/17/24  Teresa Redell LABOR, NP  amphetamine -dextroamphetamine  (ADDERALL) 20 MG tablet Take 1 tablet (20 mg total) by mouth 3 (three) times daily. 04/17/24 05/17/24  Teresa Redell LABOR, NP  Ascorbic Acid (VITAMIN C) 100 MG tablet Take 100 mg by mouth  daily.    [provider]  cariprazine  (VRAYLAR ) 3 MG capsule Take 1 capsule (3 mg total) by mouth daily. 02/29/24   Teresa Redell LABOR, NP  Multiple Vitamins-Minerals (MULTIVITAMIN WOMEN 50+ PO) Take by mouth. Patient not taking: Reported on 05/15/2024    [provider]    Allergies as of 05/01/2024   (No Known Allergies)    Family History  Problem Relation Age of Onset   Anxiety disorder Mother    Depression Mother    Breast cancer Mother 58   Alcohol abuse Maternal Uncle    Breast cancer Maternal Aunt    Breast cancer Maternal Grandmother     Social History   Socioeconomic History   Marital status: Married    Spouse name: sam   Number of children: 2   Years of education: Not on file   Highest education level: Associate degree: occupational, scientist, product/process development, or vocational program  Occupational History   Not on file  Tobacco Use   Smoking status: Former    Current packs/day: 0.00    Types: Cigarettes    Quit date: 12/14/2007    Years since quitting: 16.4   Smokeless tobacco: Never  Vaping Use   Vaping status: Never Used  Substance and Sexual Activity   Alcohol use: Yes    Comment: may have drink on Holidays   Drug use: Never   Sexual activity: Yes    Partners: Male    Birth control/protection: None  Other Topics Concern   Not on file  Social History Narrative   Not on file   Social Drivers of Health   Financial Resource Strain: Low Risk  (03/30/2024)   Received from [facility]   Overall Financial Resource Strain (CARDIA)    Difficulty of Paying Living Expenses: Not very hard  Food Insecurity: No Food Insecurity (03/30/2024)   Received from [facility]   Hunger Vital Sign    Within the past 12 months, you worried that your food would run out before you got the money to buy more.: Never true    Within the past 12 months, the food you bought just didn't last and you didn't have money to get more.: Never true   Transportation Needs: No Transportation Needs (03/30/2024)   Received from [facility]    In the past 12 months, has lack of transportation kept you from medical appointments or from getting medications?: No    Lack of Transportation (Non-Medical): No  Physical Activity: Inactive (10/18/2017)   Exercise Vital Sign    Days of Exercise per Week: 0 days    Minutes of Exercise per Session: 0 min  Stress: Stress Concern Present (10/18/2017)   [employer] - Occupational Stress Questionnaire    Feeling of  Stress : Very much  Social Connections: Unknown (10/18/2017)   Social Connection and Isolation Panel    Frequency of Communication with Friends and Family: Not on file    Frequency of Social Gatherings with Friends and Family: Not on file    Attends Religious Services: Never    Active Member of Clubs or Organizations: No    Attends Banker Meetings: Never    Marital Status: Married  Catering Manager Violence: Not At Risk (10/18/2017)   Humiliation, Afraid, Rape, and Kick questionnaire    Fear of Current or Ex-Partner: No    Emotionally Abused: No    Physically Abused: No    Sexually Abused: No    Review of Systems: See HPI, otherwise negative ROS  Physical Exam: There were no vitals taken for this visit. General:   Alert,  pleasant and cooperative in NAD Head:  Normocephalic and atraumatic. Neck:  Supple; no masses or thyromegaly. Lungs:  Clear throughout to auscultation, normal respiratory effort.    Heart:  +S1, +S2, Regular rate and rhythm, No edema. Abdomen:  Soft, nontender and nondistended. Normal bowel sounds, without guarding, and without rebound.   Neurologic:  Alert and  oriented x4;  grossly normal neurologically.  Impression/Plan: Gina Wallace is here for an endoscopy and colonoscopy  to be performed for  evaluation of nausea, vomiting and colon cancer screening average risk     Risks, benefits, limitations, and alternatives regarding endoscopy have been reviewed with the patient.  Questions have been answered.  All parties agreeable.   Gina Kung, MD  05/15/2024, 8:28 AM

## 2024-05-15 NOTE — Transfer of Care (Signed)
 Immediate Anesthesia Transfer of Care Note  Patient: Gina Wallace  Procedure(s) Performed: COLONOSCOPY EGD (ESOPHAGOGASTRODUODENOSCOPY) POLYPECTOMY, INTESTINE  Patient Location: Endoscopy Unit  Anesthesia Type:General  Level of Consciousness: drowsy and patient cooperative  Airway & Oxygen Therapy: Patient Spontanous Breathing and Patient connected to face mask oxygen  Post-op Assessment: Post -op Vital signs reviewed and stable  Post vital signs: Reviewed and stable  Last Vitals:  Vitals Value Taken Time  BP 98/75 05/15/24 09:28  Temp    Pulse 81 05/15/24 09:29  Resp 16 05/15/24 09:29  SpO2 97 % 05/15/24 09:29  Vitals shown include unfiled device data.  Last Pain:  Vitals:   05/15/24 0827  TempSrc: Temporal  PainSc: 3          Complications: No notable events documented.

## 2024-05-15 NOTE — Anesthesia Postprocedure Evaluation (Signed)
 Anesthesia Post Note  Patient: Citlalli Weikel Buck  Procedure(s) Performed: COLONOSCOPY EGD (ESOPHAGOGASTRODUODENOSCOPY) POLYPECTOMY, INTESTINE  Patient location during evaluation: Endoscopy Anesthesia Type: General Level of consciousness: awake and alert Pain management: pain level controlled Vital Signs Assessment: post-procedure vital signs reviewed and stable Respiratory status: spontaneous breathing, nonlabored ventilation, respiratory function stable and patient connected to nasal cannula oxygen Cardiovascular status: blood pressure returned to baseline and stable Postop Assessment: no apparent nausea or vomiting Anesthetic complications: no   No notable events documented.   Last Vitals:  Vitals:   05/15/24 0928 05/15/24 0939  BP: 98/75 109/68  Pulse: 75 68  Resp: 17 16  Temp: (!) 35.7 C   SpO2: 99% 100%    Last Pain:  Vitals:   05/15/24 0939  TempSrc:   PainSc: 0-No pain                 Lendia LITTIE Mae

## 2024-05-15 NOTE — Op Note (Signed)
 [facility] Patient Name: Gina Wallace Procedure Date: 05/15/2024 8:47 AM MRN: [id number] Account #: [id number] Date of Birth: [birth year] Admit Type: Outpatient Age: 68 Room: [facility] ENDO ROOM 1 Gender: Female Note Status: Finalized Instrument Name: Barnie GI Scope [id number] Procedure:             Upper GI endoscopy Indications:           Nausea with vomiting Providers:             [name] MD, MD Referring MD:          [name] MD, MD (Referring MD), Layman ORN. Lenon                         MD, MD (Referring MD) Medicines:             Monitored Anesthesia Care Complications:         No immediate complications. Procedure:             Pre-Anesthesia Assessment:                        - Prior to the procedure, a History and Physical was                         performed, and patient medications, allergies and                         sensitivities were reviewed. The patient's tolerance                         of previous anesthesia was reviewed.                        - The risks and benefits of the procedure and the                         sedation options and risks were discussed with the                         patient. All questions were answered and informed                         consent was obtained.                        - ASA Grade Assessment: II - A patient with mild                         systemic disease.                        After obtaining informed consent, the endoscope was                         passed under direct vision. Throughout the procedure,                         the patient's blood pressure, pulse, and oxygen  saturations were monitored continuously. The Endoscope                         was introduced through the mouth, and advanced to the                         third part of duodenum. The upper GI endoscopy was                         accomplished with ease. The patient tolerated the                          procedure well. Findings:      The examined duodenum was normal.      Patchy moderate inflammation characterized by congestion (edema) and       erythema was found on the greater curvature of the stomach. Biopsies       were taken with a cold forceps for histology.      The cardia and gastric fundus were normal on retroflexion.      A medium-sized hiatal hernia was present.      LA Grade A (one or more mucosal breaks less than 5 mm, not extending       between tops of 2 mucosal folds) esophagitis with no bleeding was found       at the gastroesophageal junction. Impression:            - Normal examined duodenum.                        - Gastritis. Biopsied.                        - Medium-sized hiatal hernia.                        - LA Grade A reflux esophagitis with no bleeding. Recommendation:        - Await pathology results.                        - Perform a colonoscopy today. Procedure Code(s):     --- Professional ---                        [phone], Esophagogastroduodenoscopy, flexible,                         transoral; with biopsy, single or multiple Diagnosis Code(s):     --- Professional ---                        K29.70, Gastritis, unspecified, without bleeding                        K44.9, Diaphragmatic hernia without obstruction or                         gangrene                        K21.00, Gastro-esophageal reflux disease with  esophagitis, without bleeding                        R11.2, Nausea with vomiting, unspecified CPT copyright 2022 American Medical Association. All rights reserved. The codes documented in this report are preliminary and upon coder review may  be revised to meet current compliance requirements. [name], MD [name] MD, MD 05/15/2024 9:02:18 AM This report has been signed electronically. Number of Addenda: 0 Note Initiated On: 05/15/2024 8:47 AM Estimated Blood Loss:  Estimated blood loss:  none.      [facility]

## 2024-05-15 NOTE — Op Note (Signed)
 [facility] Patient Name: Gina Wallace Procedure Date: 05/15/2024 8:46 AM MRN: [id number] Account #: [id number] Date of Birth: [birth year] Admit Type: Outpatient Age: 68 Room: [facility] ENDO ROOM 1 Gender: Female Note Status: Finalized Instrument Name: Colon Scope [id number] Procedure:             Colonoscopy Indications:           Screening for colorectal malignant neoplasm Providers:             [name] MD, MD Referring MD:          [name] MD, MD (Referring MD), Layman ORN. Lenon                         MD, MD (Referring MD) Medicines:             Monitored Anesthesia Care Complications:         No immediate complications. Procedure:             Pre-Anesthesia Assessment:                        - Prior to the procedure, a History and Physical was                         performed, and patient medications, allergies and                         sensitivities were reviewed. The patient's tolerance                         of previous anesthesia was reviewed.                        - The risks and benefits of the procedure and the                         sedation options and risks were discussed with the                         patient. All questions were answered and informed                         consent was obtained.                        - ASA Grade Assessment: II - A patient with mild                         systemic disease.                        After obtaining informed consent, the colonoscope was                         passed under direct vision. Throughout the procedure,                         the patient's blood pressure, pulse, and oxygen  saturations were monitored continuously. The                         Colonoscope was introduced through the anus and                         advanced to the the cecum, identified by the                         appendiceal orifice. The colonoscopy was technically                          difficult and complex due to a tortuous colon. The                         patient tolerated the procedure well. The quality of                         the bowel preparation was excellent. The ileocecal                         valve, appendiceal orifice, and rectum were                         photographed. Findings:      The perianal and digital rectal examinations were normal.      Three sessile polyps were found in the ascending colon. The polyps were       4 to 6 mm in size. These polyps were removed with a cold snare.       Resection and retrieval were complete.      Multiple medium-mouthed diverticula were found in the sigmoid colon.      The exam was otherwise without abnormality on direct and retroflexion       views.      The exam was otherwise without abnormality on direct and retroflexion       views. Impression:            - Three 4 to 6 mm polyps in the ascending colon,                         removed with a cold snare. Resected and retrieved.                        - Diverticulosis in the sigmoid colon.                        - The examination was otherwise normal on direct and                         retroflexion views.                        - The examination was otherwise normal on direct and                         retroflexion views. Recommendation:        - Discharge patient to home (with escort).                        -  Resume previous diet.                        - Continue present medications.                        - Await pathology results.                        - Repeat colonoscopy for surveillance based on                         pathology results. Procedure Code(s):     --- Professional ---                        [phone], Colonoscopy, flexible; with removal of                         tumor(s), polyp(s), or other lesion(s) by snare                         technique Diagnosis Code(s):     --- Professional ---                        Z12.11,  Encounter for screening for malignant neoplasm                         of colon                        D12.2, Benign neoplasm of ascending colon                        K57.30, Diverticulosis of large intestine without                         perforation or abscess without bleeding CPT copyright 2022 American Medical Association. All rights reserved. The codes documented in this report are preliminary and upon coder review may  be revised to meet current compliance requirements. [name], MD [name] MD, MD 05/15/2024 9:27:44 AM This report has been signed electronically. Number of Addenda: 0 Note Initiated On: 05/15/2024 8:46 AM Scope Withdrawal Time: 0 hours 16 minutes 45 seconds  Total Procedure Duration: 0 hours 22 minutes 27 seconds  Estimated Blood Loss:  Estimated blood loss: none.      [facility]

## 2024-05-16 LAB — SURGICAL PATHOLOGY

## 2024-05-31 ENCOUNTER — Ambulatory Visit: Admitting: Behavioral Health

## 2024-05-31 ENCOUNTER — Encounter: Payer: Self-pay | Admitting: Behavioral Health

## 2024-05-31 DIAGNOSIS — G4700 Insomnia, unspecified: Secondary | ICD-10-CM | POA: Diagnosis not present

## 2024-05-31 DIAGNOSIS — F901 Attention-deficit hyperactivity disorder, predominantly hyperactive type: Secondary | ICD-10-CM

## 2024-05-31 MED ORDER — AMPHETAMINE-DEXTROAMPHETAMINE 20 MG PO TABS: 20.0000 mg | ORAL_TABLET | Freq: Three times a day (TID) | ORAL | 0 refills | Status: AC

## 2024-05-31 MED ORDER — TRAZODONE HCL 150 MG PO TABS: ORAL_TABLET | ORAL | 1 refills | Status: AC

## 2024-05-31 MED ORDER — AMPHETAMINE-DEXTROAMPHETAMINE 20 MG PO TABS
20.0000 mg | ORAL_TABLET | Freq: Three times a day (TID) | ORAL | 0 refills | Status: AC
Start: 2024-06-16 — End: 2024-07-16
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
## 2024-06-01 ENCOUNTER — Ambulatory Visit: Admitting: Family

## 2024-06-24 ENCOUNTER — Other Ambulatory Visit: Payer: Self-pay

## 2024-06-24 ENCOUNTER — Emergency Department

## 2024-06-24 ENCOUNTER — Emergency Department: Admission: EM | Admit: 2024-06-24 | Discharge: 2024-06-24 | Disposition: A | Discharge status: Home / Self Care

## 2024-06-24 DIAGNOSIS — K8050 Calculus of bile duct without cholangitis or cholecystitis without obstruction: Secondary | ICD-10-CM | POA: Insufficient documentation

## 2024-06-24 LAB — BASIC METABOLIC PANEL WITH GFR
Anion gap: 12 (ref 5–15)
BUN: 19 mg/dL (ref 8–23)
CO2: 24 mmol/L (ref 22–32)
Calcium: 9.5 mg/dL (ref 8.9–10.3)
Chloride: 108 mmol/L (ref 98–111)
Creatinine, Ser: 1.31 mg/dL — ABNORMAL HIGH (ref 0.44–1.00)
GFR, Estimated: 44 mL/min — ABNORMAL LOW (ref >60)
Glucose, Bld: 115 mg/dL — ABNORMAL HIGH (ref 70–99)
Potassium: 3.6 mmol/L (ref 3.5–5.1)
Sodium: 143 mmol/L (ref 135–145)

## 2024-06-24 LAB — HEPATIC FUNCTION PANEL
ALT: 11 U/L (ref 0–44)
AST: 23 U/L (ref 15–41)
Albumin: 3.8 g/dL (ref 3.5–5.0)
Alkaline Phosphatase: 72 U/L (ref 38–126)
Bilirubin, Direct: 0.1 mg/dL (ref 0.0–0.2)
Indirect Bilirubin: 0.1 mg/dL — ABNORMAL LOW (ref 0.3–0.9)
Total Bilirubin: 0.3 mg/dL (ref 0.0–1.2)
Total Protein: 5.7 g/dL — ABNORMAL LOW (ref 6.5–8.1)

## 2024-06-24 LAB — CBC
HCT: 36.3 % (ref 36.0–46.0)
Hemoglobin: 11.6 g/dL — ABNORMAL LOW (ref 12.0–15.0)
MCH: 27.6 pg (ref 26.0–34.0)
MCHC: 32 g/dL (ref 30.0–36.0)
MCV: 86.4 fL (ref 80.0–100.0)
Platelets: 216 K/uL (ref 150–400)
RBC: 4.2 MIL/uL (ref 3.87–5.11)
RDW: 14.1 % (ref 11.5–15.5)
WBC: 4 K/uL (ref 4.0–10.5)
nRBC: 0 % (ref 0.0–0.2)

## 2024-06-24 LAB — TROPONIN T, HIGH SENSITIVITY
Troponin T High Sensitivity: <15 ng/L (ref 0–19)
Troponin T High Sensitivity: <15 ng/L (ref 0–19)

## 2024-06-24 LAB — LIPASE, BLOOD: Lipase: 27 U/L (ref 11–51)

## 2024-06-24 LAB — LACTIC ACID, PLASMA
Lactic Acid, Venous: 3.5 mmol/L (ref 0.5–1.9)
Lactic Acid, Venous: 1.4 mmol/L (ref 0.5–1.9)

## 2024-06-24 MED ORDER — SODIUM CHLORIDE 0.9 % IV BOLUS
1000.0000 mL | Freq: Once | INTRAVENOUS | Status: AC
  Administered 2024-06-24: 1000 mL via INTRAVENOUS

## 2024-06-24 MED ORDER — HYDROMORPHONE HCL 1 MG/ML IJ SOLN
1.0000 mg | Freq: Once | INTRAMUSCULAR | Status: AC
  Administered 2024-06-24: 1 mg via INTRAVENOUS
  Filled 2024-06-24: qty 1

## 2024-06-24 MED ORDER — IOHEXOL 350 MG/ML SOLN
75.0000 mL | Freq: Once | INTRAVENOUS | Status: AC | PRN
  Administered 2024-06-24: 75 mL via INTRAVENOUS

## 2024-06-24 NOTE — ED Triage Notes (Signed)
 First nurse note: pt reports chest and back pain for over a week, worsening pain with inspiration

## 2024-06-24 NOTE — ED Triage Notes (Signed)
 Pt to ED for upper back pain since about 1 week that started radiating to R chest since 2 days, worse today. Pain appears severe. Pain is sharp and stabbing. Pt is crying. Hx TIA and takes baby aspirin. Pain worse with inspiration.

## 2024-06-24 NOTE — ED Provider Notes (Signed)
 [facility] Provider Note    Event Date/Time   First MD Initiated Contact with Patient 06/24/24 1749     (approximate)   History   Chest Pain and Back Pain   HPI  Gina Wallace is a 68 y.o. female who presents today with concern of back pain and chest pain.  Patient has been experiencing right-sided scapular pain when going to bed over the last 4 to 5 days.  Seems when going to bed pain will be sharp in between her scapula and spine on the right side, no other affiliated symptoms.  She is able to go to bed when she wakes up pain is gone.  Seems that this went on for about 4 days, today after eating she developed severe right-sided back pain that felt to be radiating to her right chest wall.  No affiliated nausea vomiting shortness of breath.  Denies history of PEs or DVTs has not had any swelling in her extremities, no history of known cancer no associated hemoptysis, no recent surgeries, no hormonal therapy.  Denies any known history of cardiac disease.  She does endorse a history of gallstones but gallbladder is still present.      Physical Exam   Triage Vital Signs: ED Triage Vitals  Encounter Vitals Group     BP 06/24/24 1741 (!) 85/66     Girls Systolic BP Percentile --      Girls Diastolic BP Percentile --      Boys Systolic BP Percentile --      Boys Diastolic BP Percentile --      Pulse Rate 06/24/24 1741 69     Resp 06/24/24 1741 (!) 22     Temp 06/24/24 1749 98 F (36.7 C)     Temp Source 06/24/24 1749 Oral     SpO2 06/24/24 1741 97 %     Weight 06/24/24 1740 238 lb (108 kg)     Height 06/24/24 1740 5' 8 (1.727 m)     Head Circumference --      Peak Flow --      Pain Score 06/24/24 1738 10     Pain Loc --      Pain Education --      Exclude from Growth Chart --     Most recent vital signs: Vitals:   06/24/24 1855 06/24/24 1930  BP: (!) 90/57 (!) 104/47  Pulse: (!) 52 (!) 58  Resp: 18 18  Temp:  (!) 97.5 F (36.4 C)   SpO2: 100% 100%     General: Awake, appears uncomfortable, clutching chest CV:  Good peripheral perfusion.  Resp:  Normal effort.  Abd:  No distention.  Soft without significant tenderness appreciated on abdominal exam Chest:  Tenderness to palpation along the right lower chest and the right back Other:     ED Results / Procedures / Treatments   Labs (all labs ordered are listed, but only abnormal results are displayed) Labs Reviewed  BASIC METABOLIC PANEL WITH GFR - Abnormal; Notable for the following components:      Result Value   Glucose, Bld 115 (*)    Creatinine, Ser 1.31 (*)    GFR, Estimated 44 (*)    All other components within normal limits  CBC - Abnormal; Notable for the following components:   Hemoglobin 11.6 (*)    All other components within normal limits  LACTIC ACID, PLASMA - Abnormal; Notable for the following components:   Lactic Acid, Venous 3.5 (*)  All other components within normal limits  HEPATIC FUNCTION PANEL - Abnormal; Notable for the following components:   Total Protein 5.7 (*)    Indirect Bilirubin 0.1 (*)    All other components within normal limits  LACTIC ACID, PLASMA  LIPASE, BLOOD  TROPONIN T, HIGH SENSITIVITY  TROPONIN T, HIGH SENSITIVITY     EKG  On my independent interpretation appears to be a sinus rhythm with rate of about 70, axis of about 70, intervals appear to be within normal limits, no obvious ischemia   RADIOLOGY No acute findings on chest x-ray on my independent interpretation  PROCEDURES:  Critical Care performed: No  Procedures   MEDICATIONS ORDERED IN ED: Medications  HYDROmorphone  (DILAUDID ) injection 1 mg (1 mg Intravenous Given 06/24/24 1814)  sodium chloride  0.9 % bolus 1,000 mL (0 mLs Intravenous Stopped 06/24/24 2143)  iohexol  (OMNIPAQUE ) 350 MG/ML injection 75 mL (75 mLs Intravenous Contrast Given 06/24/24 1837)     IMPRESSION / MDM / ASSESSMENT AND PLAN / ED COURSE  I reviewed the triage  vital signs and the nursing notes.                               Patient's presentation is most consistent with acute complicated illness / injury requiring diagnostic workup.  68 year old female who presents today with concern of right-sided back and chest wall pain.  Initially hypotensive with description of pleuritic right sided chest wall pain.  Seems pain is greater in the right shoulder blade and scapula, EKG is nonischemic appearing.  She does have a history of gallstones, and I feel clinically more likely associated with biliary source.  Regardless given hypotension and location of the complaint, will also obtain CTA of the chest to further assess for possible PE.  Will attempt symptomatic management and determine further workup accordingly.   Clinical Course as of 06/24/24 7665  Sat Jun 24, 2024  1905 CT imaging with questionable findings of atelectasis versus pneumonia, but no acute PE.  Troponin here is also negative, will follow-up ultrasound results and determine further workup accordingly. [SK]  1908 Patient does appear to be more comfortable, awaiting ultrasound results now. [SK]  2132 Patient's lactic has cleared, I considered admission but her pain is much better controlled at this time.  I do suspect this was related to biliary colic.  She does not have any noted transaminitis or leukocytosis.  Given the symptoms we will have her discharged home with outpatient surgery follow-up.  I discussed return precautions, she verbalized understanding and is agreeable with the plan. [SK]    Clinical Course User Index [SK] Fernand Rossie HERO, MD     FINAL CLINICAL IMPRESSION(S) / ED DIAGNOSES   Final diagnoses:  Biliary colic     Rx / DC Orders   ED Discharge Orders     None        Note:  This document was prepared using Dragon voice recognition software and may include unintentional dictation errors.   Fernand Rossie HERO, MD 06/24/24 [phone]

## 2024-06-24 NOTE — ED Notes (Signed)
 Spoke with EDP who is ordering CTA.

## 2024-06-24 NOTE — Discharge Instructions (Signed)
 You were seen today due to concern of chest pain.  I suspect your symptoms are likely due to contractions of your gallbladder.  Fortunately your symptoms have significantly improved at this time.  I would recommend contacting the surgeons office as soon as possible to arrange for a follow-up appointment.  If you have any worsening of symptoms such as fevers, chills, uncontrolled nausea, vomiting, or any other symptoms you find concerning please return to the emergency department immediately for further medical management.
